# Patient Record
Sex: Female | Born: 1945 | Hispanic: No | Smoking: Former smoker
Health system: Southern US, Community
[De-identification: ages and names within clinical notes are randomized; demographics above are authoritative.]

## PROBLEM LIST (undated history)

## (undated) DIAGNOSIS — I10 Essential (primary) hypertension: Secondary | ICD-10-CM

## (undated) DIAGNOSIS — M1990 Unspecified osteoarthritis, unspecified site: Secondary | ICD-10-CM

## (undated) DIAGNOSIS — Z9981 Dependence on supplemental oxygen: Secondary | ICD-10-CM

## (undated) DIAGNOSIS — F32A Depression, unspecified: Secondary | ICD-10-CM

## (undated) DIAGNOSIS — J9611 Chronic respiratory failure with hypoxia: Secondary | ICD-10-CM

## (undated) DIAGNOSIS — I5030 Unspecified diastolic (congestive) heart failure: Secondary | ICD-10-CM

## (undated) DIAGNOSIS — I2699 Other pulmonary embolism without acute cor pulmonale: Secondary | ICD-10-CM

## (undated) DIAGNOSIS — N183 Chronic kidney disease, stage 3 unspecified: Secondary | ICD-10-CM

## (undated) DIAGNOSIS — F329 Major depressive disorder, single episode, unspecified: Secondary | ICD-10-CM

## (undated) DIAGNOSIS — E785 Hyperlipidemia, unspecified: Secondary | ICD-10-CM

## (undated) DIAGNOSIS — E119 Type 2 diabetes mellitus without complications: Secondary | ICD-10-CM

## (undated) HISTORY — DX: Essential (primary) hypertension: I10

## (undated) HISTORY — DX: Unspecified osteoarthritis, unspecified site: M19.90

## (undated) HISTORY — DX: Other pulmonary embolism without acute cor pulmonale: I26.99

## (undated) HISTORY — PX: REPLACEMENT TOTAL KNEE BILATERAL: SUR1225

## (undated) HISTORY — DX: Major depressive disorder, single episode, unspecified: F32.9

## (undated) HISTORY — PX: BACK SURGERY: SHX140

## (undated) HISTORY — DX: Depression, unspecified: F32.A

## (undated) HISTORY — PX: ABDOMINAL HYSTERECTOMY: SHX81

---

## 2017-01-27 LAB — CBC AND DIFFERENTIAL
HEMATOCRIT: 34 — AB (ref 36–46)
Hemoglobin: 10.2 — AB (ref 12.0–16.0)
Platelets: 202 (ref 150–399)
WBC: 9

## 2017-01-27 LAB — HEPATIC FUNCTION PANEL
ALT: 13 (ref 7–35)
AST: 21 (ref 13–35)
Alkaline Phosphatase: 66 (ref 25–125)
BILIRUBIN, TOTAL: 0.5

## 2017-01-31 LAB — BASIC METABOLIC PANEL
BUN: 23 — AB (ref 4–21)
CREATININE: 1.2 — AB (ref 0.5–1.1)
Potassium: 4.4 (ref 3.4–5.3)
Sodium: 138 (ref 137–147)

## 2017-02-03 ENCOUNTER — Non-Acute Institutional Stay (SKILLED_NURSING_FACILITY): Payer: Medicare Other | Admitting: Internal Medicine

## 2017-02-03 ENCOUNTER — Encounter: Payer: Self-pay | Admitting: Internal Medicine

## 2017-02-03 DIAGNOSIS — F329 Major depressive disorder, single episode, unspecified: Secondary | ICD-10-CM | POA: Diagnosis not present

## 2017-02-03 DIAGNOSIS — G629 Polyneuropathy, unspecified: Secondary | ICD-10-CM

## 2017-02-03 DIAGNOSIS — N189 Chronic kidney disease, unspecified: Secondary | ICD-10-CM | POA: Diagnosis not present

## 2017-02-03 DIAGNOSIS — I10 Essential (primary) hypertension: Secondary | ICD-10-CM

## 2017-02-03 DIAGNOSIS — F32A Depression, unspecified: Secondary | ICD-10-CM

## 2017-02-03 DIAGNOSIS — N179 Acute kidney failure, unspecified: Secondary | ICD-10-CM | POA: Diagnosis not present

## 2017-02-03 DIAGNOSIS — I2699 Other pulmonary embolism without acute cor pulmonale: Secondary | ICD-10-CM

## 2017-02-03 NOTE — Progress Notes (Signed)
: Provider:  Randon Goldsmith. [name], MD Location:  [facility] Room Number: [phone] Place of Service:  SNF (31)  PCP: No primary care provider on file. No care team member to display  Extended Emergency Contact Information Primary Emergency Contact: Laney Potash Address: [address]          [place], [place] [address] [place] of [place] Home Phone: [phone] Relation: Son Preferred language: English Interpreter needed? No     Allergies: Patient has no known allergies.  Chief Complaint  Patient presents with  . New Admit To SNF    Admit to Facility    HPI: Patient is 72 y.o. female with hypertension who presented to [facility] with shortness of breath over the past few weeks with exertion such as walking to the bathroom. She denied fever, chills, cough, or chest pain. History of COPD, asthma, or congestive heart failure. She plan to go to her PCP on the day of admission the office was closed with a holiday. In the ED the patient was afebrile, normotensive, saturating 96% on room air, with mild tachycardia, and tachypnea. Labs were normal except for sodium 133, BUN 30, creatinine 1.46. Dissection was unimpressive. EKG shows sinus tach at 102 with right bundle branch block. Patient was unable to ambulate with assistance. Patient was started on Lovenox for possible PE and his d-dimer more elevated. Patient was admitted to [facility] from 1/21-25. Secondary to acute kidney injury on chronic kidney disease stage III patient had VQ scan done which showed a low probability of PE.Marland Kitchen Her TTE also show mild to moderately dilated and hypokinetic right ventricle. Her HAI was treated with IV fluids and asked her creatinine improved a CT PE study was done that was diagnostic for PE. Patient was switched from Lovenox to Eliquis. Patient's blood pressure was low normal so her lisinopril was held. Patient is admitted to skilled nursing  facility for OT/PT. While at skilled nursing facility patient will be followed for depression treated with Celexa, polyneuropathy treated with Neurontin and hypertension treated with watchful waiting  Past Medical History:  Diagnosis Date  . Arthritis   . Depression   . Hypertension     Past Surgical History:  Procedure Laterality Date  . ABDOMINAL HYSTERECTOMY    . BACK SURGERY    . REPLACEMENT TOTAL KNEE BILATERAL      Allergies as of 02/03/2017   No Known Allergies     Medication List        Accurate as of 02/03/17  9:52 AM. Always use your most recent med list.          apixaban 5 MG Tabs tablet Commonly known as:  ELIQUIS Take 2 tablets (10 mg) by mouth 2 times daily for 5 days. Stop 02/05/2017. Start on 02/06/2017  taking 1 tablet (5 mg) by mouth 2 times daily   aspirin EC 81 MG tablet Take 81 mg by mouth daily.   citalopram 20 MG tablet Commonly known as:  CELEXA Take 20 mg by mouth daily.   gabapentin 300 MG capsule Commonly known as:  NEURONTIN Take 300 mg by mouth 3 (three) times daily.       No orders of the defined types were placed in this encounter.    There is no immunization history on file for this patient.  Social History   Tobacco Use  . Smoking status: Former Smoker    Years: 55.00  Last attempt to quit: 04/2016    Years since quitting: 0.8  . Smokeless tobacco: Never Used  Substance Use Topics  . Alcohol use: No    Frequency: Never    Family history is   Family History  Problem Relation Age of Onset  . Cancer Mother   . Hypertension Mother   . Diabetes Mother   . Hypertension Father   . Cancer Sister   . Diabetes Sister   . Diabetes Maternal Grandmother   . Hypertension Maternal Grandmother   . Diabetes Maternal Grandfather   . Hypertension Maternal Grandfather       Review of Systems  DATA OBTAINED: from patient, nurse GENERAL:  no fevers, fatigue, appetite changes SKIN: No itching, or rash EYES: No eye pain,  redness, discharge EARS: No earache, tinnitus, change in hearing NOSE: No congestion, drainage or bleeding  MOUTH/THROAT: No mouth or tooth pain, No sore throat RESPIRATORY: No cough, wheezing, SOB CARDIAC: No chest pain, palpitations, lower extremity edema  GI: No abdominal pain, No N/V/D or constipation, No heartburn or reflux  GU: No dysuria, frequency or urgency, or incontinence  MUSCULOSKELETAL: No unrelieved bone/joint pain NEUROLOGIC: No headache, dizziness or focal weakness PSYCHIATRIC: No c/o anxiety or sadness   Vitals:   02/03/17 0921  BP: 122/67  Pulse: (!) 101  Resp: 16  Temp: 98.7 F (37.1 C)  SpO2: 98%    SpO2 Readings from Last 1 Encounters:  02/03/17 98%   Body mass index is 34.28 kg/m.     Physical Exam  GENERAL APPEARANCE: Alert, conversant,  No acute distress.  SKIN: No diaphoresis rash HEAD: Normocephalic, atraumatic  EYES: Conjunctiva/lids clear. Pupils round, reactive. EOMs intact.  EARS: External exam WNL, canals clear. Hearing grossly normal.  NOSE: No deformity or discharge.  MOUTH/THROAT: Lips w/o lesions  RESPIRATORY: Breathing is even, unlabored. Lung sounds are clear   CARDIOVASCULAR: Heart RRR no murmurs, rubs or gallops. Trace peripheral edema.   GASTROINTESTINAL: Abdomen is soft, non-tender, not distended w/ normal bowel sounds. GENITOURINARY: Bladder non tender, not distended  MUSCULOSKELETAL: No abnormal joints or musculature NEUROLOGIC:  Cranial nerves 2-12 grossly intact. Moves all extremities  PSYCHIATRIC: Mood and affect appropriate to situation, no behavioral issues  There are no active problems to display for this patient.     Labs reviewed: Basic Metabolic Panel:    Component Value Date/Time   NA 138 01/31/2017   K 4.4 01/31/2017   BUN 23 (A) 01/31/2017   CREATININE 1.2 (A) 01/31/2017   AST 21 01/27/2017   ALT 13 01/27/2017   ALKPHOS 66 01/27/2017    Recent Labs    01/31/17  NA 138  K 4.4  BUN 23*    CREATININE 1.2*   Liver Function Tests: Recent Labs    01/27/17  AST 21  ALT 13  ALKPHOS 66   No results for input(s): LIPASE, AMYLASE in the last 8760 hours. No results for input(s): AMMONIA in the last 8760 hours. CBC: Recent Labs    01/27/17  WBC 9.0  HGB 10.2*  HCT 34*  PLT 202   Lipid No results for input(s): CHOL, HDL, LDLCALC, TRIG in the last 8760 hours.  Cardiac Enzymes: No results for input(s): CKTOTAL, CKMB, CKMBINDEX, TROPONINI in the last 8760 hours. BNP: No results for input(s): BNP in the last 8760 hours. No results found for: MICROALBUR No results found for: HGBA1C No results found for: TSH No results found for: VITAMINB12 No results found for: FOLATE No results found  for: IRON, TIBC, FERRITIN  Imaging and Procedures obtained prior to SNF admission: Patient was never admitted.   Not all labs, radiology exams or other studies done during hospitalization come through on my EPIC note; however they are reviewed by me.    Assessment and Plan  Pulmonary embolism- patient with low probability VQ scan but all other indicators were pointing towards a PE; once her acute kidney injury was improved with IV fluids CT PE was diagnostic for PE; patient had artery and placed on Lovenox but then was switched to Eliquis. SNF - admitted for OT/PT; continue Eliquis 2 mg twice a day until 1/30 then 5 mg twice a day starting 1/31 with a stop date of 05/07/2017  Hypertension-blood pressure was soft so both blood pressure medication were stopped including patient's lisinopril SNF - will be monitoring at skilled nursing facility; initial blood pressure meds as indicated  Acute renal failure on chronic kidney disease stage III-treated with IV fluids; initial creatinine 1.46 SNF - -on 1/25 patient's creatinine was 1.24; will follow-up BMP  Polyneuropathy SNF - not stated as uncontrolled; continue Neurontin 300 mg by mouth 3 times a day  Depression SNF - not stated as  uncontrolled; continue Celexa 20 mg by mouth daily   Time spent greater than 45 minutes;> 50% of time with patient was spent reviewing records, labs, tests and studies, counseling and developing plan of care  Thurston Holenne D. Lyn HollingsheadAlexander, MD

## 2017-02-05 LAB — BASIC METABOLIC PANEL
BUN: 19 (ref 4–21)
CREATININE: 1 (ref <=1.1)
GLUCOSE: 95
POTASSIUM: 4.3 (ref 3.4–5.3)
SODIUM: 142 (ref 137–147)

## 2017-02-05 LAB — CBC AND DIFFERENTIAL
HCT: 24 — AB (ref 36–46)
Hemoglobin: 6.7 — AB (ref 12.0–16.0)
Platelets: 293 (ref 150–399)
WBC: 9.7

## 2017-02-05 LAB — POCT INR: INR: 1.5 — AB (ref 0.9–1.1)

## 2017-02-07 ENCOUNTER — Other Ambulatory Visit: Payer: Self-pay | Admitting: *Deleted

## 2017-02-08 ENCOUNTER — Encounter: Payer: Self-pay | Admitting: Internal Medicine

## 2017-02-08 DIAGNOSIS — N189 Chronic kidney disease, unspecified: Secondary | ICD-10-CM

## 2017-02-08 DIAGNOSIS — F329 Major depressive disorder, single episode, unspecified: Secondary | ICD-10-CM | POA: Insufficient documentation

## 2017-02-08 DIAGNOSIS — I2699 Other pulmonary embolism without acute cor pulmonale: Secondary | ICD-10-CM | POA: Insufficient documentation

## 2017-02-08 DIAGNOSIS — G629 Polyneuropathy, unspecified: Secondary | ICD-10-CM | POA: Insufficient documentation

## 2017-02-08 DIAGNOSIS — N179 Acute kidney failure, unspecified: Secondary | ICD-10-CM | POA: Insufficient documentation

## 2017-02-08 DIAGNOSIS — I10 Essential (primary) hypertension: Secondary | ICD-10-CM | POA: Insufficient documentation

## 2017-02-08 DIAGNOSIS — F32A Depression, unspecified: Secondary | ICD-10-CM | POA: Insufficient documentation

## 2017-02-11 LAB — BASIC METABOLIC PANEL
BUN: 14 (ref 4–21)
Creatinine: 1 (ref 0.5–1.1)
Potassium: 3.8 (ref 3.4–5.3)
SODIUM: 142 (ref 137–147)

## 2017-02-12 LAB — CBC AND DIFFERENTIAL
HCT: 26 — AB (ref 36–46)
HEMOGLOBIN: 8 — AB (ref 12.0–16.0)
Platelets: 226 (ref 150–399)
WBC: 10.9

## 2017-02-14 ENCOUNTER — Non-Acute Institutional Stay (SKILLED_NURSING_FACILITY): Payer: Medicare Other | Admitting: Internal Medicine

## 2017-02-14 ENCOUNTER — Encounter: Payer: Self-pay | Admitting: Internal Medicine

## 2017-02-14 DIAGNOSIS — I2699 Other pulmonary embolism without acute cor pulmonale: Secondary | ICD-10-CM

## 2017-02-14 DIAGNOSIS — K922 Gastrointestinal hemorrhage, unspecified: Secondary | ICD-10-CM

## 2017-02-14 DIAGNOSIS — R600 Localized edema: Secondary | ICD-10-CM

## 2017-02-14 DIAGNOSIS — N390 Urinary tract infection, site not specified: Secondary | ICD-10-CM | POA: Diagnosis not present

## 2017-02-14 DIAGNOSIS — D62 Acute posthemorrhagic anemia: Secondary | ICD-10-CM | POA: Diagnosis not present

## 2017-02-14 DIAGNOSIS — B9620 Unspecified Escherichia coli [E. coli] as the cause of diseases classified elsewhere: Secondary | ICD-10-CM

## 2017-02-14 DIAGNOSIS — R0902 Hypoxemia: Secondary | ICD-10-CM | POA: Diagnosis not present

## 2017-02-14 DIAGNOSIS — F32A Depression, unspecified: Secondary | ICD-10-CM

## 2017-02-14 DIAGNOSIS — F329 Major depressive disorder, single episode, unspecified: Secondary | ICD-10-CM

## 2017-02-14 DIAGNOSIS — I10 Essential (primary) hypertension: Secondary | ICD-10-CM

## 2017-02-14 DIAGNOSIS — G629 Polyneuropathy, unspecified: Secondary | ICD-10-CM | POA: Diagnosis not present

## 2017-02-14 NOTE — Progress Notes (Signed)
Provider:  [name] MD Location:  [facility] Room Number: [id number] Place of Service:  SNF (31)  PCP: No primary care provider on file. No care team member to display  Extended Emergency Contact Information Primary Emergency Contact: Laney Potash Address: [address]          [place], [place] [address] D[place] of [place] Home Phone: [phone] Relation: Son Preferred language: English Interpreter needed? No Secondary Emergency Contact: Marcille Buffy Home Phone: [phone] Relation: Sister     Allergies: Patient has no known allergies.  Chief Complaint  Patient presents with  . Readmit To SNF    HPI: Patient is an 72 y.o. female with hypertension, history of pulmonary embolism diagnosed 2 weeks ago on Eliquis and aspirin who was admitted to [facility] from 1/30-2/6 after her five-day labs at the nursing home showed a hemoglobin of 6.7. There was no history of GI bleed or any other symptoms such as shortness of breath chest pain and weakness. Patient's Hemoccult was positive in the ED. Patient underwent EGD and colonoscopy were no source of bleeding therefore source of bleeding was felt to be the small bowel. Patient received 2 units PRBC with a DC hemoglobin of 8. Hospital course was complicated by an Escherichia coli UTI treated with Rocephin for 3 days and then oral medications for 12 more days. Patient also had hypoxia requiring 1-2 L of O2 which she was not able to be weaned off of in the hospital. patient is admitted to skilled nursing facility for continued OT/PT. While at skilled nursing facility patient will be treated for depression with Celexa, polyneuropathy treated with Neurontin and PE treated with Eliquis.  Past Medical History:  Diagnosis Date  . Arthritis   . Depression   . Hypertension   . Pulmonary embolism [facility])     Past Surgical History:  Procedure Laterality Date  . ABDOMINAL HYSTERECTOMY     . BACK SURGERY    . REPLACEMENT TOTAL KNEE BILATERAL      Allergies as of 02/14/2017   No Known Allergies     Medication List        Accurate as of 02/14/17  2:07 PM. Always use your most recent med list.          acetaminophen 325 MG tablet Commonly known as:  TYLENOL Take 650 mg by mouth every 4 (four) hours as needed for mild pain or fever.   albuterol (2.5 MG/3ML) 0.083% nebulizer solution Commonly known as:  PROVENTIL Take 2.5 mg by nebulization every 4 (four) hours as needed for wheezing.   apixaban 5 MG Tabs tablet Commonly known as:  ELIQUIS Take 2 tablets (10 mg) by mouth 2 times daily for 5 days. Stop 02/05/2017. Start on 02/06/2017  taking 1 tablet (5 mg) by mouth 2 times daily   ciprofloxacin 500 MG tablet Commonly known as:  CIPRO Take 500 mg by mouth 2 (two) times daily.   citalopram 20 MG tablet Commonly known as:  CELEXA Take 20 mg by mouth daily.   gabapentin 300 MG capsule Commonly known as:  NEURONTIN Take 300 mg by mouth 3 (three) times daily.   pantoprazole 40 MG tablet Commonly known as:  PROTONIX Take 40 mg by mouth daily.        There is no immunization history on file for this patient.  Social History   Tobacco Use  . Smoking status: Former Smoker    Years: 55.00  Last attempt to quit: 04/2016    Years since quitting: 0.8  . Smokeless tobacco: Never Used  Substance Use Topics  . Alcohol use: No    Frequency: Never    Family history is   Family History  Problem Relation Age of Onset  . Cancer Mother   . Hypertension Mother   . Diabetes Mother   . Hypertension Father   . Cancer Sister   . Diabetes Sister   . Diabetes Maternal Grandmother   . Hypertension Maternal Grandmother   . Diabetes Maternal Grandfather   . Hypertension Maternal Grandfather       Review of Systems  DATA OBTAINED: from patient, nurse GENERAL:  no fevers, fatigue, appetite changes SKIN: No itching, or rash EYES: No eye pain, redness,  discharge EARS: No earache, tinnitus, change in hearing NOSE: No congestion, drainage or bleeding  MOUTH/THROAT: No mouth or tooth pain, No sore throat RESPIRATORY: No cough, wheezing, SOB CARDIAC: No chest pain, palpitations, lower extremity edema  GI: No abdominal pain, No N/V/D or constipation, No heartburn or reflux  GU: No dysuria, frequency or urgency, or incontinence  MUSCULOSKELETAL: No unrelieved bone/joint pain NEUROLOGIC: No headache, dizziness or focal weakness PSYCHIATRIC: No c/o anxiety or sadness   Vitals:   02/14/17 1341  BP: 140/77  Pulse: 85  Resp: 18  Temp: (!) 97.2 F (36.2 C)  SpO2: 98%    SpO2 Readings from Last 1 Encounters:  02/14/17 98%   Body mass index is 37.94 kg/m.     Physical Exam  GENERAL APPEARANCE: Alert, conversant,  No acute distress.  SKIN: No diaphoresis rash HEAD: Normocephalic, atraumatic  EYES: Conjunctiva/lids clear. Pupils round, reactive. EOMs intact.  EARS: External exam WNL, canals clear. Hearing grossly normal.  NOSE: No deformity or discharge.  MOUTH/THROAT: Lips w/o lesions  RESPIRATORY: Breathing is even, unlabored. Lung sounds are clear   CARDIOVASCULAR: Heart RRR no murmurs, rubs or gallops. No peripheral edema.   GASTROINTESTINAL: Abdomen is soft, non-tender, not distended w/ normal bowel sounds. GENITOURINARY: Bladder non tender, not distended  MUSCULOSKELETAL: No abnormal joints or musculature NEUROLOGIC:  Cranial nerves 2-12 grossly intact. Moves all extremities  PSYCHIATRIC: Mood and affect appropriate to situation, no behavioral issues  Patient Active Problem List   Diagnosis Date Noted  . Pulmonary embolism (HCC) 02/08/2017  . Hypertension 02/08/2017  . Acute kidney injury superimposed on chronic kidney disease (HCC) 02/08/2017  . Polyneuropathy 02/08/2017  . Depression 02/08/2017      Labs reviewed: Basic Metabolic Panel:    Component Value Date/Time   NA 142 02/11/2017   K 3.8 02/11/2017   BUN  14 02/11/2017   CREATININE 1.0 02/11/2017   AST 21 01/27/2017   ALT 13 01/27/2017   ALKPHOS 66 01/27/2017    Recent Labs    01/31/17 02/05/17 02/11/17  NA 138 142 142  K 4.4 4.3 3.8  BUN 23* 19 14  CREATININE 1.2* 1.0 1.0   Liver Function Tests: Recent Labs    01/27/17  AST 21  ALT 13  ALKPHOS 66   CBC: Recent Labs    01/27/17 02/05/17 02/12/17  WBC 9.0 9.7 10.9  HGB 10.2* 6.7* 8.0*  HCT 34* 24* 26*  PLT 202 293 226    Imaging and Procedures obtained prior to SNF admission: Patient was never admitted.   Not all labs, radiology exams or other studies done during hospitalization come through on my EPIC note; however they are reviewed by me.    Assessment and  Plan  Acute GI bleed/acute blood loss anemia-cause unknown but felt to be small bowel bleed since patient underwent EGD and colonoscopy without any signs of bleeding; bleed felt to be secondary to Eliquis and aspirin; Place of presentation hemoglobin was 7.1, she received 2 units PRBC and her discharge hemoglobin is 8 SNF [facility]; will follow-up CBC regularly  Pulmonary embolism-had been treated with Eliquis and aspirin, now just Eliquis SNF - continue Eliquis 5 mg twice a day with a start date of 1/31 and a stop date of 5/1  Escherichia coli UTI-patient was treated with Rocephin for 3 days SNF - Cipro 500 mg twice a day for 12 more days to finish course  Hypoxia/history of PE-probably secondary to PE; patient on 1-2 L nasal cannula SNF - continuing to until patient can be weaned from it; continue Eliquis 5 mg by mouth twice a day  Hypertension SNF - on no meds currently; we'll monitor  Depression SNF - stable continue Celexa 20 mg daily  Polyneuropathy SNF - patient without complaints continue Neurontin 300 mg 3 times a day  Upper extremity edema/ lower extremity edema SNF - noted on admission; will start patient on Lasix 20 mg daily with follow-up BMP   Time spent greater  than 35 minutes;> 50% of time with patient was spent reviewing records, labs, tests and studies, counseling and developing plan of care  Margit HanksAnne D Stephnie Parlier, MD

## 2017-02-15 ENCOUNTER — Encounter: Payer: Self-pay | Admitting: Internal Medicine

## 2017-02-15 DIAGNOSIS — R0902 Hypoxemia: Secondary | ICD-10-CM | POA: Insufficient documentation

## 2017-02-15 DIAGNOSIS — R600 Localized edema: Secondary | ICD-10-CM | POA: Insufficient documentation

## 2017-02-15 DIAGNOSIS — J9611 Chronic respiratory failure with hypoxia: Secondary | ICD-10-CM | POA: Insufficient documentation

## 2017-02-15 DIAGNOSIS — B9620 Unspecified Escherichia coli [E. coli] as the cause of diseases classified elsewhere: Secondary | ICD-10-CM | POA: Insufficient documentation

## 2017-02-15 DIAGNOSIS — D62 Acute posthemorrhagic anemia: Secondary | ICD-10-CM | POA: Insufficient documentation

## 2017-02-15 DIAGNOSIS — K922 Gastrointestinal hemorrhage, unspecified: Secondary | ICD-10-CM | POA: Insufficient documentation

## 2017-02-15 DIAGNOSIS — J9610 Chronic respiratory failure, unspecified whether with hypoxia or hypercapnia: Secondary | ICD-10-CM | POA: Insufficient documentation

## 2017-02-15 DIAGNOSIS — N390 Urinary tract infection, site not specified: Secondary | ICD-10-CM

## 2017-03-03 LAB — CBC AND DIFFERENTIAL
HCT: 25 — AB (ref 36–46)
Hemoglobin: 7.6 — AB (ref 12.0–16.0)

## 2017-03-13 ENCOUNTER — Non-Acute Institutional Stay (SKILLED_NURSING_FACILITY): Payer: Medicare Other | Admitting: Internal Medicine

## 2017-03-13 DIAGNOSIS — F329 Major depressive disorder, single episode, unspecified: Secondary | ICD-10-CM | POA: Diagnosis not present

## 2017-03-13 DIAGNOSIS — F32A Depression, unspecified: Secondary | ICD-10-CM

## 2017-03-13 DIAGNOSIS — D62 Acute posthemorrhagic anemia: Secondary | ICD-10-CM | POA: Diagnosis not present

## 2017-03-13 DIAGNOSIS — I2699 Other pulmonary embolism without acute cor pulmonale: Secondary | ICD-10-CM

## 2017-03-16 ENCOUNTER — Encounter: Payer: Self-pay | Admitting: Internal Medicine

## 2017-03-16 NOTE — Assessment & Plan Note (Signed)
Appears stable; continue Celexa 20 mg by mouth daily

## 2017-03-16 NOTE — Assessment & Plan Note (Signed)
Most recent hemoglobin 7.6; have ordered iron panel; will continue to monitor hemoglobin; may still be bleeding from what was felt to be a small bowel bleed

## 2017-03-16 NOTE — Assessment & Plan Note (Deleted)
Resolved

## 2017-03-16 NOTE — Progress Notes (Addendum)
[facility]   Place of Service:  SNF (31)  No primary care provider on file.  No care team member to display  Extended Emergency Contact Information Primary Emergency Contact: Laney PotashHarris, Clarence Address: [address]          [place], [place] [address] [place] Home Phone: [phone] Relation: Son Preferred language: English Interpreter needed? No Secondary Emergency Contact: Marcille Buffyroberts, lillian Home Phone: [phone] Relation: Sister    Allergies: Patient has no known allergies.  Chief Complaint  Patient presents with  . Medical Management of Chronic Issues    HPI: Patient is 72 y.o. female who is being seen for routine issues of depression, acute blood loss anemia, and  PE.  Past Medical History:  Diagnosis Date  . Arthritis   . Depression   . Hypertension   . Pulmonary embolism [facility](HCC)     Past Surgical History:  Procedure Laterality Date  . ABDOMINAL HYSTERECTOMY    . BACK SURGERY    . REPLACEMENT TOTAL KNEE BILATERAL      Allergies as of 03/13/2017   No Known Allergies     Medication List        Accurate as of 03/13/17 11:59 PM. Always use your most recent med list.          acetaminophen 325 MG tablet Commonly known as:  TYLENOL Take 650 mg by mouth every 4 (four) hours as needed for mild pain or fever.   albuterol (2.5 MG/3ML) 0.083% nebulizer solution Commonly known as:  PROVENTIL Take 2.5 mg by nebulization every 4 (four) hours as needed for wheezing.   apixaban 5 MG Tabs tablet Commonly known as:  ELIQUIS Take 2 tablets (10 mg) by mouth 2 times daily for 5 days. Stop 02/05/2017. Start on 02/06/2017  taking 1 tablet (5 mg) by mouth 2 times daily   citalopram 20 MG tablet Commonly known as:  CELEXA Take 20 mg by mouth daily.   gabapentin 300 MG capsule Commonly known as:  NEURONTIN Take 300 mg by mouth 3 (three) times daily.   pantoprazole 40 MG tablet Commonly known as:  PROTONIX Take 40 mg by  mouth daily.       No orders of the defined types were placed in this encounter.    There is no immunization history on file for this patient.  Social History   Tobacco Use  . Smoking status: Former Smoker    Years: 55.00    Last attempt to quit: 04/2016    Years since quitting: 0.9  . Smokeless tobacco: Never Used  Substance Use Topics  . Alcohol use: No    Frequency: Never    Review of Systems  DATA OBTAINED: from patient, nurse GENERAL:  no fevers, fatigue, appetite changes SKIN: No itching, rash HEENT: No complaint RESPIRATORY: No cough, wheezing, SOB CARDIAC: No chest pain, palpitations, lower extremity edema  GI: No abdominal pain, No N/V/D or constipation, No heartburn or reflux  GU: No dysuria, frequency or urgency, or incontinence  MUSCULOSKELETAL: No unrelieved bone/joint pain NEUROLOGIC: No headache, dizziness  PSYCHIATRIC: No overt anxiety or sadness  Vitals:   03/16/17 1446  BP: 135/68  Pulse: 74  Resp: 18  Temp: (!) 97 F (36.1 C)   There is no height or weight on file to calculate BMI. Physical Exam  GENERAL APPEARANCE: Alert, conversant, No acute distress  SKIN: No diaphoresis rash HEENT: Unremarkable RESPIRATORY: Breathing is even, unlabored. Lung sounds are clear ;  wearing O2  CARDIOVASCULAR: Heart RRR no murmurs, rubs or gallops. No peripheral edema  GASTROINTESTINAL: Abdomen is soft, non-tender, not distended w/ normal bowel sounds.  GENITOURINARY: Bladder non tender, not distended  MUSCULOSKELETAL: No abnormal joints or musculature NEUROLOGIC: Cranial nerves 2-12 grossly intact. Moves all extremities PSYCHIATRIC: Mood and affect appropriate to situation, no behavioral issues  Patient Active Problem List   Diagnosis Date Noted  . GI bleed 02/15/2017  . Acute blood loss anemia 02/15/2017  . Escherichia coli urinary tract infection 02/15/2017  . Hypoxia 02/15/2017  . Edema of upper extremity 02/15/2017  . Bilateral lower extremity  edema 02/15/2017  . Pulmonary embolism (HCC) 02/08/2017  . Hypertension 02/08/2017  . Acute kidney injury superimposed on chronic kidney disease (HCC) 02/08/2017  . Polyneuropathy 02/08/2017  . Depression 02/08/2017    CMP     Component Value Date/Time   NA 142 02/11/2017   K 3.8 02/11/2017   BUN 14 02/11/2017   CREATININE 1.0 02/11/2017   AST 21 01/27/2017   ALT 13 01/27/2017   ALKPHOS 66 01/27/2017   Recent Labs    01/31/17 02/05/17 02/11/17  NA 138 142 142  K 4.4 4.3 3.8  BUN 23* 19 14  CREATININE 1.2* 1.0 1.0   Recent Labs    01/27/17  AST 21  ALT 13  ALKPHOS 66   Recent Labs    01/27/17 02/05/17 02/12/17 03/03/17  WBC 9.0 9.7 10.9  --   HGB 10.2* 6.7* 8.0* 7.6*  HCT 34* 24* 26* 25*  PLT 202 293 226  --    No results for input(s): CHOL, LDLCALC, TRIG in the last 8760 hours.  Invalid input(s): HCL No results found for: MICROALBUR No results found for: TSH No results found for: HGBA1C No results found for: CHOL, HDL, LDLCALC, LDLDIRECT, TRIG, CHOLHDL  Significant Diagnostic Results in last 30 days:  No results found.  Assessment and Plan  Depression Appears stable; continue Celexa 20 mg by mouth daily  Acute blood loss anemia Most recent hemoglobin 7.6; have ordered iron panel; will continue to monitor hemoglobin; may still be bleeding from what was felt to be a small bowel bleed  Pulmonary embolism (HCC) On Eliquis until May 1; patient still requiring O2, will try to wean     Merrilee Seashore, MD

## 2017-03-16 NOTE — Assessment & Plan Note (Signed)
On Eliquis until May 1; patient still requiring O2, will try to wean

## 2017-03-17 LAB — CBC AND DIFFERENTIAL
HCT: 32 — AB (ref 36–46)
Hemoglobin: 9.1 — AB (ref 12.0–16.0)

## 2017-04-01 LAB — CBC AND DIFFERENTIAL
HEMATOCRIT: 29 — AB (ref 36–46)
HEMOGLOBIN: 8.4 — AB (ref 12.0–16.0)
Platelets: 226 (ref 150–399)
WBC: 6.7

## 2017-04-14 LAB — CBC AND DIFFERENTIAL
HEMATOCRIT: 29 — AB (ref 36–46)
Hemoglobin: 9 — AB (ref 12.0–16.0)
NEUTROS ABS: 4
PLATELETS: 237 (ref 150–399)
WBC: 6.1

## 2017-04-29 ENCOUNTER — Non-Acute Institutional Stay (SKILLED_NURSING_FACILITY): Payer: Medicare Other | Admitting: Internal Medicine

## 2017-04-29 ENCOUNTER — Encounter: Payer: Self-pay | Admitting: Internal Medicine

## 2017-04-29 DIAGNOSIS — R600 Localized edema: Secondary | ICD-10-CM | POA: Diagnosis not present

## 2017-04-29 DIAGNOSIS — G629 Polyneuropathy, unspecified: Secondary | ICD-10-CM

## 2017-04-29 DIAGNOSIS — I10 Essential (primary) hypertension: Secondary | ICD-10-CM | POA: Diagnosis not present

## 2017-04-29 NOTE — Progress Notes (Signed)
[facility] Room Number: [phone] Place of Service:  SNF [phone]  [name] D. Lyn HollingsheadAlexander, MD  No care team member to display  Extended Emergency Contact Information Primary Emergency Contact: Laney PotashHarris, Clarence Address: [address]          [place], [place] [address] [place] Home Phone: [phone] Relation: Son Preferred language: English Interpreter needed? No Secondary Emergency Contact: Marcille Buffyroberts, lillian Home Phone: [phone] Relation: Sister    Allergies: Patient has no known allergies.  Chief Complaint  Patient presents with  . Medical Management of Chronic Issues    Routine Visit    HPI: Patient is 72 y.o. female who is being seen for routine issues of polyneuropathy, hypertension, and bilateral lower extremity edema.  Past Medical History:  Diagnosis Date  . Arthritis   . Depression   . Hypertension   . Pulmonary embolism [facility](HCC)     Past Surgical History:  Procedure Laterality Date  . ABDOMINAL HYSTERECTOMY    . BACK SURGERY    . REPLACEMENT TOTAL KNEE BILATERAL      Allergies as of 04/29/2017   No Known Allergies     Medication List        Accurate as of 04/29/17 11:59 PM. Always use your most recent med list.          acetaminophen 325 MG tablet Commonly known as:  TYLENOL Take 650 mg by mouth every 4 (four) hours as needed for mild pain or fever.   acetaminophen 500 MG tablet Commonly known as:  TYLENOL Take 500 mg by mouth 2 (two) times daily.   albuterol (2.5 MG/3ML) 0.083% nebulizer solution Commonly known as:  PROVENTIL Take 2.5 mg by nebulization every 4 (four) hours as needed for wheezing.   apixaban 5 MG Tabs tablet Commonly known as:  ELIQUIS Take 5 mg by mouth 2 (two) times daily.   citalopram 20 MG tablet Commonly known as:  CELEXA Take 20 mg by mouth daily.   furosemide 20 MG tablet Commonly known as:  LASIX Take 20 mg by mouth daily.   gabapentin 300 MG  capsule Commonly known as:  NEURONTIN Take 300 mg by mouth 3 (three) times daily.   pantoprazole 40 MG tablet Commonly known as:  PROTONIX Take 40 mg by mouth daily.   potassium chloride 10 MEQ tablet Commonly known as:  K-DUR,KLOR-CON Take 10 mEq by mouth daily.   Vitamin D (Ergocalciferol) 50000 units Caps capsule Commonly known as:  DRISDOL Take 50,000 Units by mouth every 7 (seven) days.       No orders of the defined types were placed in this encounter.    There is no immunization history on file for this patient.  Social History   Tobacco Use  . Smoking status: Former Smoker    Years: 55.00    Last attempt to quit: 04/2016    Years since quitting: 1.1  . Smokeless tobacco: Never Used  Substance Use Topics  . Alcohol use: No    Frequency: Never    Review of Systems  DATA OBTAINED: from patient GENERAL:  no fevers, fatigue, appetite changes SKIN: No itching, rash HEENT: No complaint RESPIRATORY: No cough, wheezing, SOB CARDIAC: No chest pain, palpitations, lower extremity edema  GI: No abdominal pain, No N/V/D or constipation, No heartburn or reflux  GU: No dysuria, frequency or urgency, or incontinence  MUSCULOSKELETAL: No unrelieved bone/joint pain NEUROLOGIC: No headache, dizziness  PSYCHIATRIC: No overt anxiety or  sadness  Vitals:   04/29/17 1513  BP: 130/78  Pulse: 69  Resp: 16  Temp: 98.2 F (36.8 C)  SpO2: 98%   Body mass index is 37.26 kg/m. Physical Exam  GENERAL APPEARANCE: Alert, conversant, No acute distress  SKIN: No diaphoresis rash HEENT: Unremarkable RESPIRATORY: Breathing is even, unlabored. Lung sounds are clear   CARDIOVASCULAR: Heart RRR no murmurs, rubs or gallops.  This peripheral edema  GASTROINTESTINAL: Abdomen is soft, non-tender, not distended w/ normal bowel sounds.  GENITOURINARY: Bladder non tender, not distended  MUSCULOSKELETAL: No abnormal joints or musculature NEUROLOGIC: Cranial nerves 2-12 grossly intact.  Moves all extremities PSYCHIATRIC: Mood and affect appropriate to situation, no behavioral issues  Patient Active Problem List   Diagnosis Date Noted  . GI bleed 02/15/2017  . Acute blood loss anemia 02/15/2017  . Escherichia coli urinary tract infection 02/15/2017  . Hypoxia 02/15/2017  . Edema of upper extremity 02/15/2017  . Bilateral lower extremity edema 02/15/2017  . Pulmonary embolism (HCC) 02/08/2017  . Hypertension 02/08/2017  . Acute kidney injury superimposed on chronic kidney disease (HCC) 02/08/2017  . Polyneuropathy 02/08/2017  . Depression 02/08/2017    CMP     Component Value Date/Time   NA 142 02/11/2017   K 3.8 02/11/2017   BUN 14 02/11/2017   CREATININE 1.0 02/11/2017   AST 21 01/27/2017   ALT 13 01/27/2017   ALKPHOS 66 01/27/2017   Recent Labs    01/31/17 02/05/17 02/11/17  NA 138 142 142  K 4.4 4.3 3.8  BUN 23* 19 14  CREATININE 1.2* 1.0 1.0   Recent Labs    01/27/17  AST 21  ALT 13  ALKPHOS 66   Recent Labs    02/12/17  03/17/17 04/01/17 04/14/17  WBC 10.9  --   --  6.7 6.1  NEUTROABS  --   --   --   --  4  HGB 8.0*   < > 9.1* 8.4* 9.0*  HCT 26*   < > 32* 29* 29*  PLT 226  --   --  226 237   < > = values in this interval not displayed.   No results for input(s): CHOL, LDLCALC, TRIG in the last 8760 hours.  Invalid input(s): HCL No results found for: MICROALBUR No results found for: TSH No results found for: HGBA1C No results found for: CHOL, HDL, LDLCALC, LDLDIRECT, TRIG, CHOLHDL  Significant Diagnostic Results in last 30 days:  No results found.  Assessment and Plan  Polyneuropathy Patient complains that her current regimen is not working;  Hypertension Controlled on Lasix 20 mg daily; continue Lasix 20 mg daily  Bilateral lower extremity edema At baseline; continue Lasix 20 mg daily    Anne D. [name], MD

## 2017-05-18 ENCOUNTER — Encounter: Payer: Self-pay | Admitting: Internal Medicine

## 2017-05-18 NOTE — Assessment & Plan Note (Signed)
Patient complains that her current regimen is not working;

## 2017-05-18 NOTE — Assessment & Plan Note (Signed)
Controlled on Lasix 20 mg daily; continue Lasix 20 mg daily 

## 2017-05-18 NOTE — Assessment & Plan Note (Addendum)
At baseline; continue Lasix 20 mg daily

## 2017-05-20 ENCOUNTER — Encounter: Payer: Self-pay | Admitting: Internal Medicine

## 2017-05-20 NOTE — Progress Notes (Signed)
Opened in error; Disregard.

## 2017-05-21 ENCOUNTER — Non-Acute Institutional Stay (SKILLED_NURSING_FACILITY): Payer: Medicare Other | Admitting: Internal Medicine

## 2017-05-21 ENCOUNTER — Encounter: Payer: Self-pay | Admitting: Internal Medicine

## 2017-05-21 DIAGNOSIS — N189 Chronic kidney disease, unspecified: Secondary | ICD-10-CM | POA: Diagnosis not present

## 2017-05-21 DIAGNOSIS — F329 Major depressive disorder, single episode, unspecified: Secondary | ICD-10-CM

## 2017-05-21 DIAGNOSIS — F32A Depression, unspecified: Secondary | ICD-10-CM

## 2017-05-21 DIAGNOSIS — M1990 Unspecified osteoarthritis, unspecified site: Secondary | ICD-10-CM

## 2017-05-21 NOTE — Progress Notes (Signed)
[facility] Room Number: [phone] Place of Service:  SNF (31)  [name], MD  Patient Care Team: [name], MD as PCP - General (Internal Medicine)  Extended Emergency Contact Information Primary Emergency Contact: Laney Potash Address: [address]          [place], [place] [address] [place] Home Phone: [phone] Relation: Son Preferred language: English Interpreter needed? No Secondary Emergency Contact: Marcille Buffy Home Phone: [phone] Relation: Sister    Allergies: Patient has no known allergies.  Chief Complaint  Patient presents with  . Medical Management of Chronic Issues    HPI: Patient is 73 y.o. female who is being seen for routine issues of depression, chronic kidney disease, and osteoarthritis.  Past Medical History:  Diagnosis Date  . Arthritis   . Depression   . Hypertension   . Pulmonary embolism [facility])     Past Surgical History:  Procedure Laterality Date  . ABDOMINAL HYSTERECTOMY    . BACK SURGERY    . REPLACEMENT TOTAL KNEE BILATERAL      Allergies as of 05/21/2017   No Known Allergies     Medication List        Accurate as of 05/21/17 11:59 PM. Always use your most recent med list.          acetaminophen 325 MG tablet Commonly known as:  TYLENOL Take 650 mg by mouth every 4 (four) hours as needed for mild pain or fever.   acetaminophen 500 MG tablet Commonly known as:  TYLENOL Take 500 mg by mouth 2 (two) times daily.   albuterol (2.5 MG/3ML) 0.083% nebulizer solution Commonly known as:  PROVENTIL Take 2.5 mg by nebulization every 4 (four) hours as needed for wheezing.   diphenhydrAMINE 25 MG tablet Commonly known as:  BENADRYL Take 25 mg by mouth every 6 (six) hours as needed for itching.   furosemide 20 MG tablet Commonly known as:  LASIX Take 20 mg by mouth daily.   gabapentin 300 MG capsule Commonly known as:  NEURONTIN Take 600 mg  by mouth 3 (three) times daily.   pantoprazole 40 MG tablet Commonly known as:  PROTONIX Take 40 mg by mouth daily.   potassium chloride 10 MEQ tablet Commonly known as:  K-DUR,KLOR-CON Take 10 mEq by mouth daily.   Vitamin D (Ergocalciferol) 50000 units Caps capsule Commonly known as:  DRISDOL Take 50,000 Units by mouth every 7 (seven) days.       No orders of the defined types were placed in this encounter.    There is no immunization history on file for this patient.  Social History   Tobacco Use  . Smoking status: Former Smoker    Years: 55.00    Last attempt to quit: 04/2016    Years since quitting: 1.1  . Smokeless tobacco: Never Used  Substance Use Topics  . Alcohol use: No    Frequency: Never    Review of Systems  DATA OBTAINED: from patient, nurse GENERAL:  no fevers, fatigue, appetite changes SKIN: No itching, rash HEENT: No complaint RESPIRATORY: No cough, wheezing, SOB CARDIAC: No chest pain, palpitations, lower extremity edema  GI: No abdominal pain, No N/V/D or constipation, No heartburn or reflux  GU: No dysuria, frequency or urgency, or incontinence  MUSCULOSKELETAL: No unrelieved bone/joint pain NEUROLOGIC: No headache, dizziness  PSYCHIATRIC: No overt anxiety or sadness  Vitals:   05/21/17 1503  BP: (!) 132/58  Pulse: 76  Resp: 18  Temp: (!) 97.4 F (36.3 C)  SpO2: 96%   Body mass index is 37.85 kg/m. Physical Exam  GENERAL APPEARANCE: Alert, conversant, No acute distress  SKIN: No diaphoresis rash HEENT: Unremarkable RESPIRATORY: Breathing is even, unlabored. Lung sounds are clear   CARDIOVASCULAR: Heart RRR no murmurs, rubs or gallops. No peripheral edema  GASTROINTESTINAL: Abdomen is soft, non-tender, not distended w/ normal bowel sounds.  GENITOURINARY: Bladder non tender, not distended  MUSCULOSKELETAL: No abnormal joints or musculature NEUROLOGIC: Cranial nerves 2-12 grossly intact. Moves all extremities PSYCHIATRIC: Mood  and affect appropriate to situation, no behavioral issues  Patient Active Problem List   Diagnosis Date Noted  . Chronic kidney disease (CKD) 06/02/2017  . Arthritis 06/02/2017  . GI bleed 02/15/2017  . Acute blood loss anemia 02/15/2017  . Escherichia coli urinary tract infection 02/15/2017  . Hypoxia 02/15/2017  . Edema of upper extremity 02/15/2017  . Bilateral lower extremity edema 02/15/2017  . Pulmonary embolism (HCC) 02/08/2017  . Hypertension 02/08/2017  . Acute kidney injury superimposed on chronic kidney disease (HCC) 02/08/2017  . Polyneuropathy 02/08/2017  . Depression 02/08/2017    CMP     Component Value Date/Time   NA 142 02/11/2017   K 3.8 02/11/2017   BUN 14 02/11/2017   CREATININE 1.0 02/11/2017   AST 21 01/27/2017   ALT 13 01/27/2017   ALKPHOS 66 01/27/2017   Recent Labs    01/31/17 02/05/17 02/11/17  NA 138 142 142  K 4.4 4.3 3.8  BUN 23* 19 14  CREATININE 1.2* 1.0 1.0   Recent Labs    01/27/17  AST 21  ALT 13  ALKPHOS 66   Recent Labs    02/12/17  03/17/17 04/01/17 04/14/17  WBC 10.9  --   --  6.7 6.1  NEUTROABS  --   --   --   --  4  HGB 8.0*   < > 9.1* 8.4* 9.0*  HCT 26*   < > 32* 29* 29*  PLT 226  --   --  226 237   < > = values in this interval not displayed.   No results for input(s): CHOL, LDLCALC, TRIG in the last 8760 hours.  Invalid input(s): HCL No results found for: MICROALBUR No results found for: TSH No results found for: HGBA1C No results found for: CHOL, HDL, LDLCALC, LDLDIRECT, TRIG, CHOLHDL  Significant Diagnostic Results in last 30 days:  No results found.  Assessment and Plan  Depression Patient appears stable off of her Celexa; will continue to monitor patient off her medication  Chronic kidney disease (CKD) No recent GFR but creatinine remains stable at 1.0; will monitor intervals  Arthritis No complaints; appears comfortable; continue Tylenol 500 mg twice daily    Anne D. [name], MD

## 2017-06-02 ENCOUNTER — Encounter: Payer: Self-pay | Admitting: Internal Medicine

## 2017-06-02 DIAGNOSIS — M1990 Unspecified osteoarthritis, unspecified site: Secondary | ICD-10-CM | POA: Insufficient documentation

## 2017-06-02 DIAGNOSIS — N182 Chronic kidney disease, stage 2 (mild): Secondary | ICD-10-CM | POA: Insufficient documentation

## 2017-06-02 DIAGNOSIS — N189 Chronic kidney disease, unspecified: Secondary | ICD-10-CM | POA: Insufficient documentation

## 2017-06-02 NOTE — Assessment & Plan Note (Signed)
No recent GFR but creatinine remains stable at 1.0; will monitor intervals

## 2017-06-02 NOTE — Assessment & Plan Note (Signed)
Patient appears stable off of her Celexa; will continue to monitor patient off her medication

## 2017-06-02 NOTE — Assessment & Plan Note (Signed)
No complaints; appears comfortable; continue Tylenol 500 mg twice daily

## 2017-06-11 LAB — VITAMIN D 25 HYDROXY (VIT D DEFICIENCY, FRACTURES): VIT D 25 HYDROXY: 40.6

## 2017-06-12 ENCOUNTER — Encounter: Payer: Self-pay | Admitting: Internal Medicine

## 2017-06-12 ENCOUNTER — Non-Acute Institutional Stay (SKILLED_NURSING_FACILITY): Payer: Medicare Other | Admitting: Internal Medicine

## 2017-06-12 DIAGNOSIS — G629 Polyneuropathy, unspecified: Secondary | ICD-10-CM | POA: Diagnosis not present

## 2017-06-12 DIAGNOSIS — I2699 Other pulmonary embolism without acute cor pulmonale: Secondary | ICD-10-CM

## 2017-06-12 DIAGNOSIS — I10 Essential (primary) hypertension: Secondary | ICD-10-CM | POA: Diagnosis not present

## 2017-06-12 NOTE — Progress Notes (Signed)
[facility] Room Number: [phone] Place of Service:  SNF (31)  [name], MD  Patient Care Team: [name], MD as PCP - General (Internal Medicine)  Extended Emergency Contact Information Primary Emergency Contact: Laney Potash Address: [address]          [place], [place] [address] [place] Home Phone: [phone] Relation: Son Preferred language: English Interpreter needed? No Secondary Emergency Contact: Marcille Buffy Home Phone: [phone] Relation: Sister    Allergies: Patient has no known allergies.  Chief Complaint  Patient presents with  . Medical Management of Chronic Issues    Routine Visit    HPI: Patient is 72 y.o. female who is being seen for routine issues of history of PE, hypertension, and polyneuropathy.  Past Medical History:  Diagnosis Date  . Arthritis   . Depression   . Hypertension   . Pulmonary embolism [facility])     Past Surgical History:  Procedure Laterality Date  . ABDOMINAL HYSTERECTOMY    . BACK SURGERY    . REPLACEMENT TOTAL KNEE BILATERAL      Allergies as of 06/12/2017   No Known Allergies     Medication List        Accurate as of 06/12/17 11:59 PM. Always use your most recent med list.          acetaminophen 325 MG tablet Commonly known as:  TYLENOL Take 650 mg by mouth every 4 (four) hours as needed for mild pain or fever.   acetaminophen 500 MG tablet Commonly known as:  TYLENOL Take 500 mg by mouth 2 (two) times daily.   albuterol (2.5 MG/3ML) 0.083% nebulizer solution Commonly known as:  PROVENTIL Take 2.5 mg by nebulization every 4 (four) hours as needed for wheezing.   calcium carbonate 500 MG chewable tablet Commonly known as:  TUMS - dosed in mg elemental calcium Chew 2 tablets by mouth 3 (three) times daily as needed for indigestion or heartburn.   diphenhydrAMINE 25 MG tablet Commonly known as:  BENADRYL Take 25 mg by mouth  every 6 (six) hours as needed for itching.   furosemide 20 MG tablet Commonly known as:  LASIX Take 20 mg by mouth daily.   gabapentin 300 MG capsule Commonly known as:  NEURONTIN Take 600 mg by mouth 3 (three) times daily.   pantoprazole 40 MG tablet Commonly known as:  PROTONIX Take 40 mg by mouth daily.   potassium chloride 10 MEQ tablet Commonly known as:  K-DUR,KLOR-CON Take 10 mEq by mouth daily.       No orders of the defined types were placed in this encounter.    There is no immunization history on file for this patient.  Social History   Tobacco Use  . Smoking status: Former Smoker    Years: 55.00    Last attempt to quit: 04/2016    Years since quitting: 1.2  . Smokeless tobacco: Never Used  Substance Use Topics  . Alcohol use: No    Frequency: Never    Review of Systems  DATA OBTAINED: from patient, nurse GENERAL:  no fevers, fatigue, appetite changes SKIN: No itching, rash HEENT: No complaint RESPIRATORY: No cough, wheezing, SOB CARDIAC: No chest pain, palpitations, lower extremity edema  GI: No abdominal pain, No N/V/D or constipation, No heartburn or reflux  GU: No dysuria, frequency or urgency, or incontinence  MUSCULOSKELETAL: No unrelieved bone/joint pain NEUROLOGIC: No headache, dizziness  PSYCHIATRIC:  No overt anxiety or sadness  Vitals:   06/12/17 1110  BP: 131/79  Pulse: 95  Resp: 20  Temp: (!) 97.1 F (36.2 C)  SpO2: 96%   Body mass index is 37.85 kg/m. Physical Exam  GENERAL APPEARANCE: Alert, conversant, No acute distress  SKIN: No diaphoresis rash HEENT: Unremarkable RESPIRATORY: Breathing is even, unlabored. Lung sounds are clear   CARDIOVASCULAR: Heart RRR no murmurs, rubs or gallops. No peripheral edema  GASTROINTESTINAL: Abdomen is soft, non-tender, not distended w/ normal bowel sounds.  GENITOURINARY: Bladder non tender, not distended  MUSCULOSKELETAL: No abnormal joints or musculature NEUROLOGIC: Cranial nerves  2-12 grossly intact. Moves all extremities PSYCHIATRIC: Mood and affect appropriate to situation, no behavioral issues  Patient Active Problem List   Diagnosis Date Noted  . Chronic kidney disease (CKD) 06/02/2017  . Arthritis 06/02/2017  . GI bleed 02/15/2017  . Acute blood loss anemia 02/15/2017  . Escherichia coli urinary tract infection 02/15/2017  . Hypoxia 02/15/2017  . Edema of upper extremity 02/15/2017  . Bilateral lower extremity edema 02/15/2017  . Pulmonary embolism (HCC) 02/08/2017  . Hypertension 02/08/2017  . Acute kidney injury superimposed on chronic kidney disease (HCC) 02/08/2017  . Polyneuropathy 02/08/2017  . Depression 02/08/2017    CMP     Component Value Date/Time   NA 142 02/11/2017   K 3.8 02/11/2017   BUN 14 02/11/2017   CREATININE 1.0 02/11/2017   AST 21 01/27/2017   ALT 13 01/27/2017   ALKPHOS 66 01/27/2017   Recent Labs    01/31/17 02/05/17 02/11/17  NA 138 142 142  K 4.4 4.3 3.8  BUN 23* 19 14  CREATININE 1.2* 1.0 1.0   Recent Labs    01/27/17  AST 21  ALT 13  ALKPHOS 66   Recent Labs    02/12/17  04/01/17 04/14/17 06/23/17  WBC 10.9  --  6.7 6.1 5  NEUTROABS  --   --   --  4  --   HGB 8.0*   < > 8.4* 9.0* 8.7  HCT 26*   < > 29* 29* 28.9  MCV  --   --   --   --  78.6  PLT 226  --  226 237  --    < > = values in this interval not displayed.   No results for input(s): CHOL, LDLCALC, TRIG in the last 8760 hours.  Invalid input(s): HCL No results found for: MICROALBUR No results found for: TSH No results found for: HGBA1C No results found for: CHOL, HDL, LDLCALC, LDLDIRECT, TRIG, CHOLHDL  Significant Diagnostic Results in last 30 days:  No results found.  Assessment and Plan  Pulmonary embolism [facility](HCC) Patient was on Eliquis until May 1, patient is no longer on Eliquis; continue to monitor  Hypertension Controlled; continue Lasix 20 mg daily  Polyneuropathy Patient has no complaints that her regimen is not working and  patient appears in no discomfort; continue Neurontin 600 mg 3 times daily    Maximillion Gill D. Lyn HollingsheadAlexander, MD

## 2017-06-23 LAB — CBC
HCT: 28.9
HGB: 8.7
MCV: 78.6
WBC: 5
platelet count: 190

## 2017-06-27 ENCOUNTER — Encounter: Payer: Self-pay | Admitting: *Deleted

## 2017-07-06 ENCOUNTER — Encounter: Payer: Self-pay | Admitting: Internal Medicine

## 2017-07-06 NOTE — Assessment & Plan Note (Signed)
Patient was on Eliquis until May 1, patient is no longer on Eliquis; continue to monitor

## 2017-07-06 NOTE — Assessment & Plan Note (Signed)
Patient has no complaints that her regimen is not working and patient appears in no discomfort; continue Neurontin 600 mg 3 times daily

## 2017-07-06 NOTE — Assessment & Plan Note (Signed)
Controlled; continue Lasix 20 mg daily 

## 2017-07-09 ENCOUNTER — Non-Acute Institutional Stay (SKILLED_NURSING_FACILITY): Payer: Medicare Other | Admitting: Internal Medicine

## 2017-07-09 ENCOUNTER — Encounter: Payer: Self-pay | Admitting: Internal Medicine

## 2017-07-09 DIAGNOSIS — R600 Localized edema: Secondary | ICD-10-CM

## 2017-07-09 DIAGNOSIS — M1990 Unspecified osteoarthritis, unspecified site: Secondary | ICD-10-CM

## 2017-07-09 DIAGNOSIS — F329 Major depressive disorder, single episode, unspecified: Secondary | ICD-10-CM | POA: Diagnosis not present

## 2017-07-09 DIAGNOSIS — F32A Depression, unspecified: Secondary | ICD-10-CM

## 2017-07-09 NOTE — Progress Notes (Addendum)
[facility] Room Number: [id number] Place of Service:  SNF (31)  Margit HanksAlexander, Erik Nessel D, MD  Patient Care Team: Margit HanksAlexander, Faithanne Verret D, MD as PCP - General (Internal Medicine)  Extended Emergency Contact Information Primary Emergency Contact: Laney PotashHarris, Clarence Address: [address]          [place], [place] [address] [place] Home Phone: [phone] Relation: Son Preferred language: English Interpreter needed? No Secondary Emergency Contact: Marcille Buffyroberts, lillian Home Phone: [phone] Relation: Sister    Allergies: Patient has no known allergies.  Chief Complaint  Patient presents with  . Medical Management of Chronic Issues    Routine Visit    HPI: Patient is 72 y.o. female who is being seen for routine issues of depression, bilateral lower extremity edema, and osteoarthritis.  Past Medical History:  Diagnosis Date  . Arthritis   . Depression   . Hypertension   . Pulmonary embolism [facility](HCC)     Past Surgical History:  Procedure Laterality Date  . ABDOMINAL HYSTERECTOMY    . BACK SURGERY    . REPLACEMENT TOTAL KNEE BILATERAL      Allergies as of 07/09/2017   No Known Allergies     Medication List        Accurate as of 07/09/17 11:59 PM. Always use your most recent med list.          acetaminophen 325 MG tablet Commonly known as:  TYLENOL Take 650 mg by mouth every 4 (four) hours as needed for mild pain or fever.   acetaminophen 500 MG tablet Commonly known as:  TYLENOL Take 500 mg by mouth 2 (two) times daily.   albuterol (2.5 MG/3ML) 0.083% nebulizer solution Commonly known as:  PROVENTIL Take 2.5 mg by nebulization every 4 (four) hours as needed for wheezing.   calcium carbonate 500 MG chewable tablet Commonly known as:  TUMS - dosed in mg elemental calcium Chew 2 tablets by mouth 3 (three) times daily as needed for indigestion or heartburn.   diphenhydrAMINE 25 MG tablet Commonly known as:   BENADRYL Take 25 mg by mouth every 6 (six) hours as needed for itching.   furosemide 20 MG tablet Commonly known as:  LASIX Take 20 mg by mouth daily.   gabapentin 300 MG capsule Commonly known as:  NEURONTIN Take 600 mg by mouth 3 (three) times daily.   pantoprazole 40 MG tablet Commonly known as:  PROTONIX Take 40 mg by mouth daily.   potassium chloride 10 MEQ tablet Commonly known as:  K-DUR,KLOR-CON Take 10 mEq by mouth daily.       No orders of the defined types were placed in this encounter.    There is no immunization history on file for this patient.  Social History   Tobacco Use  . Smoking status: Former Smoker    Years: 55.00    Last attempt to quit: 04/2016    Years since quitting: 1.2  . Smokeless tobacco: Never Used  Substance Use Topics  . Alcohol use: No    Frequency: Never    Review of Systems per pt and nursing Review of Systems  All other systems reviewed and are negative.     Vitals:   07/09/17 1133  BP: 130/80  Pulse: 84  Resp: 20  SpO2: 96%   Body mass index is 40 kg/m. Physical Exam  GENERAL APPEARANCE: Alert, minimally conversant, No acute distress  SKIN: No diaphoresis rash HEENT: Unremarkable RESPIRATORY: Breathing is even,  unlabored. Lung sounds are clear   CARDIOVASCULAR: Heart RRR no murmurs, rubs or gallops. No peripheral edema  GASTROINTESTINAL: Abdomen is soft, non-tender, not distended w/ normal bowel sounds.  GENITOURINARY: Bladder non tender, not distended  MUSCULOSKELETAL: No abnormal joints or musculature NEUROLOGIC: Cranial nerves 2-12 grossly intact. Moves all extremities PSYCHIATRIC: affect normal to situation `, occasional behavioral issues  Patient Active Problem List   Diagnosis Date Noted  . Chronic kidney disease (CKD) 06/02/2017  . Arthritis 06/02/2017  . GI bleed 02/15/2017  . Acute blood loss anemia 02/15/2017  . Escherichia coli urinary tract infection 02/15/2017  . Hypoxia 02/15/2017  .  Edema of upper extremity 02/15/2017  . Bilateral lower extremity edema 02/15/2017  . Pulmonary embolism (HCC) 02/08/2017  . Hypertension 02/08/2017  . Acute kidney injury superimposed on chronic kidney disease (HCC) 02/08/2017  . Polyneuropathy 02/08/2017  . Depression 02/08/2017    CMP     Component Value Date/Time   NA 142 02/11/2017   K 3.8 02/11/2017   BUN 14 02/11/2017   CREATININE 1.0 02/11/2017   AST 21 01/27/2017   ALT 13 01/27/2017   ALKPHOS 66 01/27/2017   Recent Labs    01/31/17 02/05/17 02/11/17  NA 138 142 142  K 4.4 4.3 3.8  BUN 23* 19 14  CREATININE 1.2* 1.0 1.0   Recent Labs    01/27/17  AST 21  ALT 13  ALKPHOS 66   Recent Labs    02/12/17  04/01/17 04/14/17 06/23/17  WBC 10.9  --  6.7 6.1 5  NEUTROABS  --   --   --  4  --   HGB 8.0*   < > 8.4* 9.0* 8.7  HCT 26*   < > 29* 29* 28.9  MCV  --   --   --   --  78.6  PLT 226  --  226 237  --    < > = values in this interval not displayed.   No results for input(s): CHOL, LDLCALC, TRIG in the last 8760 hours.  Invalid input(s): HCL No results found for: MICROALBUR No results found for: TSH No results found for: HGBA1C No results found for: CHOL, HDL, LDLCALC, LDLDIRECT, TRIG, CHOLHDL  Significant Diagnostic Results in last 30 days:  No results found.  Assessment and Plan  Depression Continue stable on no meds; continue supportive care  Bilateral lower extremity edema Baseline; continue Lasix 20 mg daily  Arthritis Patient appears comfortable; continue Tylenol 500 mg twice daily    Justen Fonda D. [name], MD

## 2017-07-22 ENCOUNTER — Encounter: Payer: Self-pay | Admitting: Internal Medicine

## 2017-07-22 NOTE — Assessment & Plan Note (Signed)
Continue stable on no meds; continue supportive care

## 2017-07-22 NOTE — Assessment & Plan Note (Signed)
Patient appears comfortable; continue Tylenol 500 mg twice daily

## 2017-07-22 NOTE — Assessment & Plan Note (Signed)
Baseline; continue Lasix 20 mg daily

## 2017-07-23 LAB — CBC AND DIFFERENTIAL
HEMATOCRIT: 31 — AB (ref 36–46)
HEMOGLOBIN: 9.1 — AB (ref 12.0–16.0)
Hemoglobin: 9.1 — AB (ref 12.0–16.0)
Platelets: 202 (ref 150–399)
WBC: 6.6
WBC: 6.6

## 2017-08-08 LAB — BASIC METABOLIC PANEL
BUN: 27 — AB (ref 4–21)
Creatinine: 1.1 (ref 0.5–1.1)
Glucose: 135
Potassium: 4.4 (ref 3.4–5.3)
Sodium: 148 — AB (ref 137–147)

## 2017-08-15 ENCOUNTER — Encounter: Payer: Self-pay | Admitting: Internal Medicine

## 2017-08-15 ENCOUNTER — Non-Acute Institutional Stay (SKILLED_NURSING_FACILITY): Payer: Medicare Other | Admitting: Internal Medicine

## 2017-08-15 DIAGNOSIS — R5381 Other malaise: Secondary | ICD-10-CM | POA: Diagnosis not present

## 2017-08-15 DIAGNOSIS — R0902 Hypoxemia: Secondary | ICD-10-CM

## 2017-08-15 DIAGNOSIS — R2689 Other abnormalities of gait and mobility: Secondary | ICD-10-CM

## 2017-08-15 NOTE — Progress Notes (Signed)
[facility] Room Number: [phone] Place of Service:  SNF [phone]  [name] D. Lyn HollingsheadAlexander, MD  Patient Care Team: Margit HanksAlexander, Malyia Moro D, MD as PCP - General (Internal Medicine)  Extended Emergency Contact Information Primary Emergency Contact: Laney PotashHarris, Clarence Address: [address]          [place], [place] [address] [place] Home Phone: [phone] Relation: Son Preferred language: English Interpreter needed? No Secondary Emergency Contact: Marcille Buffyroberts, lillian Home Phone: [phone] Relation: Sister    Allergies: Patient has no known allergies.  Chief Complaint  Patient presents with  . Medical Management of Chronic Issues    Routine Visit    HPI: Patient is 72 y.o. female who being seen for his functional status, decreased functional mobility, and hypoxia.  She has not recovered since her prior hospitalization where she was diagnosed with bilateral PE.  These diagnoses supersede any mental health diagnosis that she had very received in the past.  Recommended level of care would be a skilled nursing facility.  Past Medical History:  Diagnosis Date  . Arthritis   . Depression   . Hypertension   . Pulmonary embolism [facility](HCC)     Past Surgical History:  Procedure Laterality Date  . ABDOMINAL HYSTERECTOMY    . BACK SURGERY    . REPLACEMENT TOTAL KNEE BILATERAL      Allergies as of 08/15/2017   No Known Allergies     Medication List        Accurate as of 08/15/17 11:59 PM. Always use your most recent med list.          acetaminophen 325 MG tablet Commonly known as:  TYLENOL Take 650 mg by mouth every 4 (four) hours as needed for mild pain or fever.   acetaminophen 500 MG tablet Commonly known as:  TYLENOL Take 500 mg by mouth 2 (two) times daily.   albuterol (2.5 MG/3ML) 0.083% nebulizer solution Commonly known as:  PROVENTIL Take 2.5 mg by nebulization every 4 (four) hours as needed for wheezing.   calcium  carbonate 500 MG chewable tablet Commonly known as:  TUMS - dosed in mg elemental calcium Chew 2 tablets by mouth 3 (three) times daily as needed for indigestion or heartburn.   diphenhydrAMINE 25 MG tablet Commonly known as:  BENADRYL Take 25 mg by mouth every 6 (six) hours as needed for itching.   furosemide 20 MG tablet Commonly known as:  LASIX Take 20 mg by mouth daily.   gabapentin 300 MG capsule Commonly known as:  NEURONTIN Take 600 mg by mouth 3 (three) times daily.   pantoprazole 40 MG tablet Commonly known as:  PROTONIX Take 40 mg by mouth daily.   potassium chloride 10 MEQ tablet Commonly known as:  K-DUR,KLOR-CON Take 10 mEq by mouth daily.       No orders of the defined types were placed in this encounter.    There is no immunization history on file for this patient.  Social History   Tobacco Use  . Smoking status: Former Smoker    Years: 55.00    Last attempt to quit: 04/2016    Years since quitting: 1.3  . Smokeless tobacco: Never Used  Substance Use Topics  . Alcohol use: No    Frequency: Never    Review of Systems  DATA OBTAINED: from patient, nurse GENERAL:  no fevers, fatigue, appetite changes SKIN: No itching, rash HEENT: No complaint RESPIRATORY: No cough, wheezing, SOB  CARDIAC: No chest pain, palpitations, lower extremity edema  GI: No abdominal pain, No N/V/D or constipation, No heartburn or reflux  GU: No dysuria, frequency or urgency, or incontinence  MUSCULOSKELETAL: No unrelieved bone/joint pain NEUROLOGIC: No headache, dizziness  PSYCHIATRIC: No overt anxiety or sadness  Vitals:   08/15/17 1324  BP: 122/66  Pulse: 74  Resp: 20  Temp: 98.4 F (36.9 C)   Body mass index is 40.62 kg/m. Physical Exam  GENERAL APPEARANCE: Alert, conversant, No acute distress  SKIN: No diaphoresis rash HEENT: Unremarkable RESPIRATORY: Breathing is even, unlabored. Lung sounds are clear: Wearing nasal cannula CARDIOVASCULAR: Heart RRR  no murmurs, rubs or gallops. No peripheral edema  GASTROINTESTINAL: Abdomen is soft, non-tender, not distended w/ normal bowel sounds.  GENITOURINARY: Bladder non tender, not distended  MUSCULOSKELETAL: No abnormal joints or musculature NEUROLOGIC: Cranial nerves 2-12 grossly intact. Moves all extremities PSYCHIATRIC: Mood and affect appropriate to situation, no behavioral issues  Patient Active Problem List   Diagnosis Date Noted  . Declining functional status 08/18/2017  . Decreased functional mobility 08/18/2017  . Chronic kidney disease (CKD) 06/02/2017  . Arthritis 06/02/2017  . GI bleed 02/15/2017  . Acute blood loss anemia 02/15/2017  . Escherichia coli urinary tract infection 02/15/2017  . Hypoxia 02/15/2017  . Edema of upper extremity 02/15/2017  . Bilateral lower extremity edema 02/15/2017  . Pulmonary embolism (HCC) 02/08/2017  . Hypertension 02/08/2017  . Acute kidney injury superimposed on chronic kidney disease (HCC) 02/08/2017  . Polyneuropathy 02/08/2017  . Depression 02/08/2017    CMP     Component Value Date/Time   NA 148 (A) 08/08/2017   K 4.4 08/08/2017   BUN 27 (A) 08/08/2017   CREATININE 1.1 08/08/2017   AST 21 01/27/2017   ALT 13 01/27/2017   ALKPHOS 66 01/27/2017   Recent Labs    02/05/17 02/11/17 08/08/17  NA 142 142 148*  K 4.3 3.8 4.4  BUN 19 14 27*  CREATININE 1.0 1.0 1.1   Recent Labs    01/27/17  AST 21  ALT 13  ALKPHOS 66   Recent Labs    04/01/17 04/14/17 06/23/17 07/23/17  WBC 6.7 6.1 5 6.6  6.6  NEUTROABS  --  4  --   --   HGB 8.4* 9.0* 8.7 9.1*  9.1*  HCT 29* 29* 28.9 31*  MCV  --   --  78.6  --   PLT 226 237  --  202   No results for input(s): CHOL, LDLCALC, TRIG in the last 8760 hours.  Invalid input(s): HCL No results found for: MICROALBUR No results found for: TSH No results found for: HGBA1C No results found for: CHOL, HDL, LDLCALC, LDLDIRECT, TRIG, CHOLHDL  Significant Diagnostic Results in last 30 days:    No results found.  Assessment and Plan  Declining functional status Since returning from her last hospitalization patient has been unable to perform her ADLs independently; she requires feeding and help with all ADLs; continue supportive care me: Patient will require skilled nursing facility care  Decreased functional mobility Is wheelchair-bound, but is unable to move her wheelchair up and down the hall with her feet and arms due to weakness and shortness of breath; continue supportive care; patient will require skilled nursing care  Hypoxia She continues to have need for O2 nasal cannula 2 L as her pulmonary embolus; have nursing do baseline exertional oxygen levels on her today: Consider sending patient to pulmonologist     Randon Goldsmith. [name],  MD

## 2017-08-18 ENCOUNTER — Encounter: Payer: Self-pay | Admitting: Internal Medicine

## 2017-08-18 DIAGNOSIS — R5381 Other malaise: Secondary | ICD-10-CM | POA: Insufficient documentation

## 2017-08-18 DIAGNOSIS — R2689 Other abnormalities of gait and mobility: Secondary | ICD-10-CM | POA: Insufficient documentation

## 2017-08-18 LAB — BASIC METABOLIC PANEL
BUN: 22 — AB (ref 4–21)
Creatinine: 0.9 (ref 0.5–1.1)
GLUCOSE: 124
Potassium: 4.5 (ref 3.4–5.3)
Sodium: 145 (ref 137–147)

## 2017-08-18 NOTE — Assessment & Plan Note (Addendum)
Since returning from her last hospitalization patient has been unable to perform her ADLs independently; she requires feeding and help with all ADLs; continue supportive care me: Patient will require skilled nursing facility care

## 2017-08-18 NOTE — Assessment & Plan Note (Addendum)
Is wheelchair-bound, but is unable to move her wheelchair up and down the hall with her feet and arms due to weakness and shortness of breath; continue supportive care; patient will require skilled nursing care

## 2017-08-18 NOTE — Assessment & Plan Note (Signed)
She continues to have need for O2 nasal cannula 2 L as her pulmonary embolus; have nursing do baseline exertional oxygen levels on her today: Consider sending patient to pulmonologist

## 2017-08-25 LAB — CBC AND DIFFERENTIAL
HCT: 31 — AB (ref 36–46)
Hemoglobin: 9.2 — AB (ref 12.0–16.0)
Platelets: 221 (ref 150–399)
WBC: 7.1

## 2017-09-11 ENCOUNTER — Non-Acute Institutional Stay (SKILLED_NURSING_FACILITY): Payer: Medicare Other | Admitting: Internal Medicine

## 2017-09-11 ENCOUNTER — Encounter: Payer: Self-pay | Admitting: Internal Medicine

## 2017-09-11 DIAGNOSIS — G629 Polyneuropathy, unspecified: Secondary | ICD-10-CM | POA: Diagnosis not present

## 2017-09-11 DIAGNOSIS — N183 Chronic kidney disease, stage 3 unspecified: Secondary | ICD-10-CM

## 2017-09-11 DIAGNOSIS — D62 Acute posthemorrhagic anemia: Secondary | ICD-10-CM | POA: Diagnosis not present

## 2017-09-11 NOTE — Progress Notes (Signed)
[facility] Room Number: [phone] Place of Service:  SNF [phone])  Randon Goldsmith. [name], MD  Patient Care Team: [name], MD as PCP - General (Internal Medicine)  Extended Emergency Contact Information Primary Emergency Contact: Laney Potash Address: [address]          [place], [place] [address] [place] Home Phone: [phone] Relation: Son Preferred language: English Interpreter needed? No Secondary Emergency Contact: Marcille Buffy Home Phone: [phone] Relation: Sister    Allergies: Patient has no known allergies.  Chief Complaint  Patient presents with  . Medical Management of Chronic Issues    Routine Visit    HPI: Patient is 72 y.o. female who is being seen for routine issues of chronic kidney disease stage III, iron deficiency anemia, and polyneuropathy.  Past Medical History:  Diagnosis Date  . Arthritis   . Depression   . Hypertension   . Pulmonary embolism [facility])     Past Surgical History:  Procedure Laterality Date  . ABDOMINAL HYSTERECTOMY    . BACK SURGERY    . REPLACEMENT TOTAL KNEE BILATERAL      Allergies as of 09/11/2017   No Known Allergies     Medication List        Accurate as of 09/11/17 11:59 PM. Always use your most recent med list.          acetaminophen 325 MG tablet Commonly known as:  TYLENOL Take 650 mg by mouth every 4 (four) hours as needed for mild pain or fever.   acetaminophen 500 MG tablet Commonly known as:  TYLENOL Take 500 mg by mouth 2 (two) times daily.   albuterol (2.5 MG/3ML) 0.083% nebulizer solution Commonly known as:  PROVENTIL Take 2.5 mg by nebulization every 4 (four) hours as needed for wheezing.   calcium carbonate 500 MG chewable tablet Commonly known as:  TUMS - dosed in mg elemental calcium Chew 2 tablets by mouth 3 (three) times daily as needed for indigestion or heartburn.   CLARITIN 10 MG tablet Generic drug:   loratadine Take 10 mg by mouth daily.   FLONASE SENSIMIST 27.5 MCG/SPRAY nasal spray Generic drug:  fluticasone Place 2 sprays into the nose daily. apply 2 sprays to both nostrils daily prn nasal congestion/ nose related allergy symptoms   furosemide 20 MG tablet Commonly known as:  LASIX Take 20 mg by mouth daily.   gabapentin 300 MG capsule Commonly known as:  NEURONTIN Take 600 mg by mouth 3 (three) times daily.   pantoprazole 40 MG tablet Commonly known as:  PROTONIX Take 40 mg by mouth daily.   potassium chloride 10 MEQ tablet Commonly known as:  K-DUR,KLOR-CON Take 10 mEq by mouth daily.       No orders of the defined types were placed in this encounter.    There is no immunization history on file for this patient.  Social History   Tobacco Use  . Smoking status: Former Smoker    Years: 55.00    Last attempt to quit: 04/2016    Years since quitting: 1.4  . Smokeless tobacco: Never Used  Substance Use Topics  . Alcohol use: No    Frequency: Never    Review of Systems  DATA OBTAINED: from patient, nurse GENERAL:  no fevers, fatigue, appetite changes SKIN: No itching, rash HEENT: No complaint RESPIRATORY: No cough, wheezing, SOB CARDIAC: No chest pain, palpitations, lower extremity edema  GI: No abdominal  pain, No N/V/D or constipation, No heartburn or reflux  GU: No dysuria, frequency or urgency, or incontinence  MUSCULOSKELETAL: No unrelieved bone/joint pain NEUROLOGIC: No headache, dizziness  PSYCHIATRIC: No overt anxiety or sadness  Vitals:   09/11/17 1257  BP: 123/76  Pulse: 98  Resp: 19  Temp: 98 F (36.7 C)  SpO2: 96%   Body mass index is 40.62 kg/m. Physical Exam  GENERAL APPEARANCE: Alert, conversant, No acute distress  SKIN: No diaphoresis rash HEENT: Unremarkable RESPIRATORY: Breathing is even, unlabored. Lung sounds are clear   CARDIOVASCULAR: Heart RRR no murmurs, rubs or gallops.  Some peripheral edema  GASTROINTESTINAL:  Abdomen is soft, non-tender, not distended w/ normal bowel sounds.  GENITOURINARY: Bladder non tender, not distended  MUSCULOSKELETAL: No abnormal joints or musculature NEUROLOGIC: Cranial nerves 2-12 grossly intact. Moves all extremities PSYCHIATRIC: Mood and affect appropriate to situation, no behavioral issues  Patient Active Problem List   Diagnosis Date Noted  . Declining functional status 08/18/2017  . Decreased functional mobility 08/18/2017  . Chronic kidney disease (CKD) 06/02/2017  . Arthritis 06/02/2017  . GI bleed 02/15/2017  . Acute blood loss anemia 02/15/2017  . Escherichia coli urinary tract infection 02/15/2017  . Hypoxia 02/15/2017  . Edema of upper extremity 02/15/2017  . Bilateral lower extremity edema 02/15/2017  . Pulmonary embolism (HCC) 02/08/2017  . Hypertension 02/08/2017  . Acute kidney injury superimposed on chronic kidney disease (HCC) 02/08/2017  . Polyneuropathy 02/08/2017  . Depression 02/08/2017    CMP     Component Value Date/Time   NA 145 08/18/2017   K 4.5 08/18/2017   BUN 22 (A) 08/18/2017   CREATININE 0.9 08/18/2017   AST 21 01/27/2017   ALT 13 01/27/2017   ALKPHOS 66 01/27/2017   Recent Labs    02/11/17 08/08/17 08/18/17  NA 142 148* 145  K 3.8 4.4 4.5  BUN 14 27* 22*  CREATININE 1.0 1.1 0.9   Recent Labs    01/27/17  AST 21  ALT 13  ALKPHOS 66   Recent Labs    04/14/17 06/23/17 07/23/17 08/25/17  WBC 6.1 5 6.6  6.6 7.1  NEUTROABS 4  --   --   --   HGB 9.0* 8.7 9.1*  9.1* 9.2*  HCT 29* 28.9 31* 31*  MCV  --  78.6  --   --   PLT 237  --  202 221   No results for input(s): CHOL, LDLCALC, TRIG in the last 8760 hours.  Invalid input(s): HCL No results found for: MICROALBUR No results found for: TSH No results found for: HGBA1C No results found for: CHOL, HDL, LDLCALC, LDLDIRECT, TRIG, CHOLHDL  Significant Diagnostic Results in last 30 days:  No results found.  Assessment and Plan  Chronic kidney disease  (CKD) Creatinine 0.9 which is improved actually from prior; monitor intervals  Acute blood loss anemia Most recent hemoglobin 9.2 which is excellent; patient is MCV though is still 79 and she is not on iron; will start her iron 325 mg daily  Polyneuropathy Over this past several months regimen has had to be increased several times; most recent increase is 1200 mg 3 times daily which patient says is working     Programmer, applications D. [name], MD

## 2017-09-13 ENCOUNTER — Encounter: Payer: Self-pay | Admitting: Internal Medicine

## 2017-09-13 NOTE — Assessment & Plan Note (Signed)
Over this past several months regimen has had to be increased several times; most recent increase is 1200 mg 3 times daily which patient says is working

## 2017-09-13 NOTE — Assessment & Plan Note (Signed)
Most recent hemoglobin 9.2 which is excellent; patient is MCV though is still 79 and she is not on iron; will start her iron 325 mg daily

## 2017-09-13 NOTE — Assessment & Plan Note (Signed)
Creatinine 0.9 which is improved actually from prior; monitor intervals

## 2017-09-16 ENCOUNTER — Encounter: Payer: Self-pay | Admitting: Internal Medicine

## 2017-09-16 ENCOUNTER — Non-Acute Institutional Stay (SKILLED_NURSING_FACILITY): Payer: Medicare Other | Admitting: Internal Medicine

## 2017-09-16 DIAGNOSIS — R635 Abnormal weight gain: Secondary | ICD-10-CM

## 2017-09-16 NOTE — Progress Notes (Signed)
Location:  [facility] Room Number: 216-W Place of Service:  SNF (31)  [name], MD  Patient Care Team: [name], MD as PCP - General (Internal Medicine)  Extended Emergency Contact Information Primary Emergency Contact: Laney Potash Address: [address]          [place], [place] [address] Darden Amber of [place] Home Phone: [phone] Relation: Son Preferred language: English Interpreter needed? No Secondary Emergency Contact: Marcille Buffy Home Phone: [phone] Relation: Sister    Allergies: Patient has no known allergies.  Chief Complaint  Patient presents with  . Acute Visit    Patient is seen due to weight gain    HPI: Patient is 72 y.o. female who is being seen for acute weight gain.  Her weight is gone from 204 pounds to 215 pounds since July 11/2017.  Patient has no edema but she is wearing TED hose patient with dementia so cannot really give a history of present illness or review of systems.  Past Medical History:  Diagnosis Date  . Arthritis   . Depression   . Hypertension   . Pulmonary embolism [facility])     Past Surgical History:  Procedure Laterality Date  . ABDOMINAL HYSTERECTOMY    . BACK SURGERY    . REPLACEMENT TOTAL KNEE BILATERAL      Allergies as of 09/16/2017   No Known Allergies     Medication List        Accurate as of 09/16/17 11:59 PM. Always use your most recent med list.          acetaminophen 325 MG tablet Commonly known as:  TYLENOL Take 650 mg by mouth every 4 (four) hours as needed for mild pain or fever.   acetaminophen 500 MG tablet Commonly known as:  TYLENOL Take 500 mg by mouth 2 (two) times daily.   albuterol (2.5 MG/3ML) 0.083% nebulizer solution Commonly known as:  PROVENTIL Take 2.5 mg by nebulization every 4 (four) hours as needed for wheezing.   calcium carbonate 500 MG chewable tablet Commonly known as:  TUMS - dosed in mg elemental calcium Chew  2 tablets by mouth 3 (three) times daily as needed for indigestion or heartburn.   CLARITIN 10 MG tablet Generic drug:  loratadine Take 10 mg by mouth daily.   FLONASE SENSIMIST 27.5 MCG/SPRAY nasal spray Generic drug:  fluticasone Place 2 sprays into the nose daily. apply 2 sprays to both nostrils daily prn nasal congestion/ nose related allergy symptoms   furosemide 20 MG tablet Commonly known as:  LASIX Take 20 mg by mouth daily.   gabapentin 300 MG capsule Commonly known as:  NEURONTIN Take 600 mg by mouth 3 (three) times daily.   pantoprazole 40 MG tablet Commonly known as:  PROTONIX Take 40 mg by mouth daily.   potassium chloride 10 MEQ tablet Commonly known as:  K-DUR,KLOR-CON Take 10 mEq by mouth daily.       No orders of the defined types were placed in this encounter.   Immunization History  Administered Date(s) Administered  . Influenza-Unspecified 09/07/2016    Social History   Tobacco Use  . Smoking status: Former Smoker    Years: 55.00    Last attempt to quit: 04/2016    Years since quitting: 1.4  . Smokeless tobacco: Never Used  Substance Use Topics  . Alcohol use: No    Frequency: Never    Review of Systems unable to obtain secondary  to dementia; nursing-as per history present illness     Vitals:   09/16/17 1124  BP: 136/65  Pulse: 96  Resp: 20  Temp: 97.6 F (36.4 C)  SpO2: 97%   Body mass index is 42.11 kg/m. Physical Exam  GENERAL APPEARANCE: Alert, moderately conversant, No acute distress  SKIN: No diaphoresis rash HEENT: Unremarkable RESPIRATORY: Breathing is even, unlabored. Lung sounds are clear   CARDIOVASCULAR: Heart RRR no murmurs, rubs or gallops. No peripheral edema  GASTROINTESTINAL: Abdomen is soft, non-tender, not distended w/ normal bowel sounds.  GENITOURINARY: Bladder non tender, not distended  MUSCULOSKELETAL: No abnormal joints or musculature NEUROLOGIC: Cranial nerves 2-12 grossly intact. Moves all  extremities PSYCHIATRIC: Dementia, no behavioral issues  Patient Active Problem List   Diagnosis Date Noted  . Declining functional status 08/18/2017  . Decreased functional mobility 08/18/2017  . Chronic kidney disease (CKD) 06/02/2017  . Arthritis 06/02/2017  . GI bleed 02/15/2017  . Acute blood loss anemia 02/15/2017  . Escherichia coli urinary tract infection 02/15/2017  . Hypoxia 02/15/2017  . Edema of upper extremity 02/15/2017  . Bilateral lower extremity edema 02/15/2017  . Pulmonary embolism (HCC) 02/08/2017  . Hypertension 02/08/2017  . Acute kidney injury superimposed on chronic kidney disease (HCC) 02/08/2017  . Polyneuropathy 02/08/2017  . Depression 02/08/2017    CMP     Component Value Date/Time   NA 145 08/18/2017   K 4.5 08/18/2017   BUN 22 (A) 08/18/2017   CREATININE 0.9 08/18/2017   AST 21 01/27/2017   ALT 13 01/27/2017   ALKPHOS 66 01/27/2017   Recent Labs    02/11/17 08/08/17 08/18/17  NA 142 148* 145  K 3.8 4.4 4.5  BUN 14 27* 22*  CREATININE 1.0 1.1 0.9   Recent Labs    01/27/17  AST 21  ALT 13  ALKPHOS 66   Recent Labs    04/14/17 06/23/17 07/23/17 08/25/17  WBC 6.1 5 6.6  6.6 7.1  NEUTROABS 4  --   --   --   HGB 9.0* 8.7 9.1*  9.1* 9.2*  HCT 29* 28.9 31* 31*  MCV  --  78.6  --   --   PLT 237  --  202 221   No results for input(s): CHOL, LDLCALC, TRIG in the last 8760 hours.  Invalid input(s): HCL No results found for: MICROALBUR No results found for: TSH No results found for: HGBA1C No results found for: CHOL, HDL, LDLCALC, LDLDIRECT, TRIG, CHOLHDL  Significant Diagnostic Results in last 30 days:  No results found.  Assessment and Plan  Weight gain- increase patient's Lasix to 40 mg daily for 7 days then back to 20 mg daily; BMP in 7 days     Merrilee Seashore, MD  : Provider:  Randon Goldsmith. [name], MD Location:  [facility] Room Number: [id number]-W Place of Service:  SNF [phone])  PCP: [name], MD Patient Care Team: [name], MD as PCP - General (Internal Medicine)  Extended Emergency Contact Information Primary Emergency Contact: Laney Potash Address: [address]          [place], [place] [address] D[place] of [place] Home Phone: [phone] Relation: Son Preferred language: English Interpreter needed? No Secondary Emergency Contact: Marcille Buffy Home Phone: [phone] Relation: Sister     Allergies: Patient has no known allergies.  Chief Complaint  Patient presents with  . Acute Visit    Patient is seen due to weight  gain    HPI: Patient is 72 y.o. female who  Past Medical History:  Diagnosis Date  . Arthritis   . Depression   . Hypertension   . Pulmonary embolism [facility])     Past Surgical History:  Procedure Laterality Date  . ABDOMINAL HYSTERECTOMY    . BACK SURGERY    . REPLACEMENT TOTAL KNEE BILATERAL      Allergies as of 09/16/2017   No Known Allergies     Medication List        Accurate as of 09/16/17 11:59 PM. Always use your most recent med list.          acetaminophen 325 MG tablet Commonly known as:  TYLENOL Take 650 mg by mouth every 4 (four) hours as needed for mild pain or fever.   acetaminophen 500 MG tablet Commonly known as:  TYLENOL Take 500 mg by mouth 2 (two) times daily.   albuterol (2.5 MG/3ML) 0.083% nebulizer solution Commonly known as:  PROVENTIL Take 2.5 mg by nebulization every 4 (four) hours as needed for wheezing.   calcium carbonate 500 MG chewable tablet Commonly known as:  TUMS - dosed in mg elemental calcium Chew 2 tablets by mouth 3 (three) times daily as needed for indigestion or heartburn.   CLARITIN 10 MG tablet Generic drug:  loratadine Take 10 mg by mouth daily.   FLONASE SENSIMIST 27.5 MCG/SPRAY nasal spray Generic drug:  fluticasone Place 2 sprays into the nose daily. apply 2 sprays to both nostrils daily prn nasal congestion/ nose related allergy symptoms     furosemide 20 MG tablet Commonly known as:  LASIX Take 20 mg by mouth daily.   gabapentin 300 MG capsule Commonly known as:  NEURONTIN Take 600 mg by mouth 3 (three) times daily.   pantoprazole 40 MG tablet Commonly known as:  PROTONIX Take 40 mg by mouth daily.   potassium chloride 10 MEQ tablet Commonly known as:  K-DUR,KLOR-CON Take 10 mEq by mouth daily.       No orders of the defined types were placed in this encounter.   Immunization History  Administered Date(s) Administered  . Influenza-Unspecified 09/07/2016    Social History   Tobacco Use  . Smoking status: Former Smoker    Years: 55.00    Last attempt to quit: 04/2016    Years since quitting: 1.4  . Smokeless tobacco: Never Used  Substance Use Topics  . Alcohol use: No    Frequency: Never    Family history is   Family History  Problem Relation Age of Onset  . Cancer Mother   . Hypertension Mother   . Diabetes Mother   . Hypertension Father   . Cancer Sister   . Diabetes Sister   . Diabetes Maternal Grandmother   . Hypertension Maternal Grandmother   . Diabetes Maternal Grandfather   . Hypertension Maternal Grandfather       Review of Systems  DATA OBTAINED: from patient, nurse, medical record, family member GENERAL:  no fevers, fatigue, appetite changes SKIN: No itching, or rash EYES: No eye pain, redness, discharge EARS: No earache, tinnitus, change in hearing NOSE: No congestion, drainage or bleeding  MOUTH/THROAT: No mouth or tooth pain, No sore throat RESPIRATORY: No cough, wheezing, SOB CARDIAC: No chest pain, palpitations, lower extremity edema  GI: No abdominal pain, No N/V/D or constipation, No heartburn or reflux  GU: No dysuria, frequency or urgency, or incontinence  MUSCULOSKELETAL: No unrelieved bone/joint pain NEUROLOGIC: No headache, dizziness or  focal weakness PSYCHIATRIC: No c/o anxiety or sadness   Vitals:   09/16/17 1124  BP: 136/65  Pulse: 96  Resp: 20   Temp: 97.6 F (36.4 C)  SpO2: 97%    SpO2 Readings from Last 1 Encounters:  09/16/17 97%   Body mass index is 42.11 kg/m.     Physical Exam  GENERAL APPEARANCE: Alert, conversant,  No acute distress.  SKIN: No diaphoresis rash HEAD: Normocephalic, atraumatic  EYES: Conjunctiva/lids clear. Pupils round, reactive. EOMs intact.  EARS: External exam WNL, canals clear. Hearing grossly normal.  NOSE: No deformity or discharge.  MOUTH/THROAT: Lips w/o lesions  RESPIRATORY: Breathing is even, unlabored. Lung sounds are clear   CARDIOVASCULAR: Heart RRR no murmurs, rubs or gallops. No peripheral edema.   GASTROINTESTINAL: Abdomen is soft, non-tender, not distended w/ normal bowel sounds. GENITOURINARY: Bladder non tender, not distended  MUSCULOSKELETAL: No abnormal joints or musculature NEUROLOGIC:  Cranial nerves 2-12 grossly intact. Moves all extremities  PSYCHIATRIC: Mood and affect appropriate to situation, no behavioral issues  Patient Active Problem List   Diagnosis Date Noted  . Declining functional status 08/18/2017  . Decreased functional mobility 08/18/2017  . Chronic kidney disease (CKD) 06/02/2017  . Arthritis 06/02/2017  . GI bleed 02/15/2017  . Acute blood loss anemia 02/15/2017  . Escherichia coli urinary tract infection 02/15/2017  . Hypoxia 02/15/2017  . Edema of upper extremity 02/15/2017  . Bilateral lower extremity edema 02/15/2017  . Pulmonary embolism (HCC) 02/08/2017  . Hypertension 02/08/2017  . Acute kidney injury superimposed on chronic kidney disease (HCC) 02/08/2017  . Polyneuropathy 02/08/2017  . Depression 02/08/2017      Labs reviewed: Basic Metabolic Panel:    Component Value Date/Time   NA 145 08/18/2017   K 4.5 08/18/2017   BUN 22 (A) 08/18/2017   CREATININE 0.9 08/18/2017   AST 21 01/27/2017   ALT 13 01/27/2017   ALKPHOS 66 01/27/2017    Recent Labs    02/11/17 08/08/17 08/18/17  NA 142 148* 145  K 3.8 4.4 4.5  BUN 14 27*  22*  CREATININE 1.0 1.1 0.9   Liver Function Tests: Recent Labs    01/27/17  AST 21  ALT 13  ALKPHOS 66   No results for input(s): LIPASE, AMYLASE in the last 8760 hours. No results for input(s): AMMONIA in the last 8760 hours. CBC: Recent Labs    04/14/17 06/23/17 07/23/17 08/25/17  WBC 6.1 5 6.6  6.6 7.1  NEUTROABS 4  --   --   --   HGB 9.0* 8.7 9.1*  9.1* 9.2*  HCT 29* 28.9 31* 31*  MCV  --  78.6  --   --   PLT 237  --  202 221   Lipid No results for input(s): CHOL, HDL, LDLCALC, TRIG in the last 8760 hours.  Cardiac Enzymes: No results for input(s): CKTOTAL, CKMB, CKMBINDEX, TROPONINI in the last 8760 hours. BNP: No results for input(s): BNP in the last 8760 hours. No results found for: MICROALBUR No results found for: HGBA1C No results found for: TSH No results found for: VITAMINB12 No results found for: FOLATE No results found for: IRON, TIBC, FERRITIN  Imaging and Procedures obtained prior to SNF admission: Patient was never admitted.   Not all labs, radiology exams or other studies done during hospitalization come through on my EPIC note; however they are reviewed by me.    Assessment and Plan    Randon Goldsmith. [name], MD

## 2017-09-21 ENCOUNTER — Encounter: Payer: Self-pay | Admitting: Internal Medicine

## 2017-09-22 LAB — BASIC METABOLIC PANEL
BUN: 29 — AB (ref 4–21)
CREATININE: 0.9 (ref 0.5–1.1)
Glucose: 140
Potassium: 4.5 (ref 3.4–5.3)
Sodium: 145 (ref 137–147)

## 2017-09-23 LAB — CBC AND DIFFERENTIAL
HCT: 31 — AB (ref 36–46)
Hemoglobin: 9.4 — AB (ref 12.0–16.0)
Platelets: 243 (ref 150–399)
WBC: 6.7

## 2017-10-09 ENCOUNTER — Encounter: Payer: Self-pay | Admitting: Internal Medicine

## 2017-10-09 ENCOUNTER — Non-Acute Institutional Stay (SKILLED_NURSING_FACILITY): Payer: Medicare Other | Admitting: Internal Medicine

## 2017-10-09 DIAGNOSIS — F329 Major depressive disorder, single episode, unspecified: Secondary | ICD-10-CM

## 2017-10-09 DIAGNOSIS — M1990 Unspecified osteoarthritis, unspecified site: Secondary | ICD-10-CM | POA: Diagnosis not present

## 2017-10-09 DIAGNOSIS — F32A Depression, unspecified: Secondary | ICD-10-CM

## 2017-10-09 DIAGNOSIS — I10 Essential (primary) hypertension: Secondary | ICD-10-CM | POA: Diagnosis not present

## 2017-10-23 LAB — CBC AND DIFFERENTIAL
HEMATOCRIT: 31 — AB (ref 36–46)
Hemoglobin: 9.3 — AB (ref 12.0–16.0)
PLATELETS: 232 (ref 150–399)
WBC: 7.1

## 2017-10-24 LAB — HM DIABETES FOOT EXAM

## 2017-11-05 ENCOUNTER — Encounter: Payer: Self-pay | Admitting: Internal Medicine

## 2017-11-05 NOTE — Progress Notes (Signed)
Location:  [facility] Room Number: [phone] Place of Service:  SNF (31)  [name], MD  Patient Care Team: [name], MD as PCP - General (Internal Medicine)  Extended Emergency Contact Information Primary Emergency Contact: Laney Potash Address: [address]          [place], [place] [address] D[place] of [place] Home Phone: [phone] Relation: Son Preferred language: English Interpreter needed? No Secondary Emergency Contact: Marcille Buffy Home Phone: [phone] Relation: Sister    Allergies: Patient has no known allergies.  Chief Complaint  Patient presents with  . Medical Management of Chronic Issues    Routine Visit  . Health Maintenance    Influenza Vacc.     HPI: Patient is 72 y.o. female who is being seen for routine issues of hypertension, depression, and osteoarthritis.  Past Medical History:  Diagnosis Date  . Arthritis   . Depression   . Hypertension   . Pulmonary embolism [facility])     Past Surgical History:  Procedure Laterality Date  . ABDOMINAL HYSTERECTOMY    . BACK SURGERY    . REPLACEMENT TOTAL KNEE BILATERAL      Allergies as of 10/09/2017   No Known Allergies     Medication List        Accurate as of 10/09/17 11:59 PM. Always use your most recent med list.          acetaminophen 325 MG tablet Commonly known as:  TYLENOL Take 650 mg by mouth every 4 (four) hours as needed for mild pain or fever.   acetaminophen 500 MG tablet Commonly known as:  TYLENOL Take 500 mg by mouth 2 (two) times daily.   albuterol (2.5 MG/3ML) 0.083% nebulizer solution Commonly known as:  PROVENTIL Take 2.5 mg by nebulization every 4 (four) hours as needed for wheezing.   calcium carbonate 500 MG chewable tablet Commonly known as:  TUMS - dosed in mg elemental calcium Chew 2 tablets by mouth 3 (three) times daily as needed for indigestion or heartburn.   CLARITIN 10 MG tablet Generic drug:   loratadine Take 10 mg by mouth daily.   FLONASE SENSIMIST 27.5 MCG/SPRAY nasal spray Generic drug:  fluticasone Place 2 sprays into the nose daily. apply 2 sprays to both nostrils daily prn nasal congestion/ nose related allergy symptoms   furosemide 20 MG tablet Commonly known as:  LASIX Take 20 mg by mouth daily.   gabapentin 300 MG capsule Commonly known as:  NEURONTIN Take 600 mg by mouth 3 (three) times daily.   pantoprazole 40 MG tablet Commonly known as:  PROTONIX Take 40 mg by mouth daily.   potassium chloride 10 MEQ tablet Commonly known as:  K-DUR,KLOR-CON Take 10 mEq by mouth daily.       No orders of the defined types were placed in this encounter.   Immunization History  Administered Date(s) Administered  . Influenza-Unspecified 09/07/2016    Social History   Tobacco Use  . Smoking status: Former Smoker    Years: 55.00    Last attempt to quit: 04/2016    Years since quitting: 1.5  . Smokeless tobacco: Never Used  Substance Use Topics  . Alcohol use: No    Frequency: Never    Review of Systems unable to obtain secondary to dementia; nursing-no acute concerns    Vitals:   10/09/17 1411  BP: 124/78  Pulse: 96  Resp: 19  Temp: 98 F (36.7 C)  SpO2: 97%   Body mass index is 41.25 kg/m. Physical Exam  GENERAL APPEARANCE: Alert, moderately conversant, No acute distress  SKIN: No diaphoresis rash HEENT: Unremarkable RESPIRATORY: Breathing is even, unlabored. Lung sounds are clear   CARDIOVASCULAR: Heart RRR no murmurs, rubs or gallops. No peripheral edema  GASTROINTESTINAL: Abdomen is soft, non-tender, not distended w/ normal bowel sounds.  GENITOURINARY: Bladder non tender, not distended  MUSCULOSKELETAL: No abnormal joints or musculature NEUROLOGIC: Cranial nerves 2-12 grossly intact. Moves all extremities PSYCHIATRIC: Dementia, no behavioral issues  Patient Active Problem List   Diagnosis Date Noted  . Declining functional status  08/18/2017  . Decreased functional mobility 08/18/2017  . Chronic kidney disease (CKD) 06/02/2017  . Arthritis 06/02/2017  . GI bleed 02/15/2017  . Acute blood loss anemia 02/15/2017  . Escherichia coli urinary tract infection 02/15/2017  . Hypoxia 02/15/2017  . Edema of upper extremity 02/15/2017  . Bilateral lower extremity edema 02/15/2017  . Pulmonary embolism (HCC) 02/08/2017  . Hypertension 02/08/2017  . Acute kidney injury superimposed on chronic kidney disease (HCC) 02/08/2017  . Polyneuropathy 02/08/2017  . Depression 02/08/2017    CMP     Component Value Date/Time   NA 145 09/22/2017   K 4.5 09/22/2017   BUN 29 (A) 09/22/2017   CREATININE 0.9 09/22/2017   AST 21 01/27/2017   ALT 13 01/27/2017   ALKPHOS 66 01/27/2017   Recent Labs    08/08/17 08/18/17 09/22/17  NA 148* 145 145  K 4.4 4.5 4.5  BUN 27* 22* 29*  CREATININE 1.1 0.9 0.9   Recent Labs    01/27/17  AST 21  ALT 13  ALKPHOS 66   Recent Labs    04/14/17 06/23/17  08/25/17 09/23/17 10/23/17  WBC 6.1 5   < > 7.1 6.7 7.1  NEUTROABS 4  --   --   --   --   --   HGB 9.0* 8.7   < > 9.2* 9.4* 9.3*  HCT 29* 28.9   < > 31* 31* 31*  MCV  --  78.6  --   --   --   --   PLT 237  --    < > 221 243 232   < > = values in this interval not displayed.   No results for input(s): CHOL, LDLCALC, TRIG in the last 8760 hours.  Invalid input(s): HCL No results found for: MICROALBUR No results found for: TSH No results found for: HGBA1C No results found for: CHOL, HDL, LDLCALC, LDLDIRECT, TRIG, CHOLHDL  Significant Diagnostic Results in last 30 days:  No results found.  Assessment and Plan  Hypertension Controlled; continue Lasix 20 mg daily  Depression Patient is doing well on no medications; continue supportive care  Arthritis Patient does not appear uncomfortable; plan to continue Tylenol 500 mg twice daily     Merrilee Seashore, MD

## 2017-11-05 NOTE — Assessment & Plan Note (Signed)
Controlled; continue Lasix 20 mg daily 

## 2017-11-05 NOTE — Assessment & Plan Note (Signed)
Patient does not appear uncomfortable; plan to continue Tylenol 500 mg twice daily

## 2017-11-05 NOTE — Assessment & Plan Note (Addendum)
Patient is doing well on no medications; continue supportive care

## 2017-11-12 ENCOUNTER — Non-Acute Institutional Stay (SKILLED_NURSING_FACILITY): Payer: Medicare Other | Admitting: Internal Medicine

## 2017-11-12 ENCOUNTER — Encounter: Payer: Self-pay | Admitting: Internal Medicine

## 2017-11-12 DIAGNOSIS — I2699 Other pulmonary embolism without acute cor pulmonale: Secondary | ICD-10-CM | POA: Diagnosis not present

## 2017-11-12 DIAGNOSIS — R600 Localized edema: Secondary | ICD-10-CM

## 2017-11-12 DIAGNOSIS — G629 Polyneuropathy, unspecified: Secondary | ICD-10-CM

## 2017-11-12 NOTE — Progress Notes (Signed)
Nursing Home Room Number: 216W Place of Service:  SNF (31)  Randon Goldsmith. [name], MD  Patient Care Team: [name], MD as PCP - General (Internal Medicine)  Extended Emergency Contact Information Primary Emergency Contact: Laney Potash Address: [address]          [place], [place] [address] D[place] of [place] Home Phone: [phone] Relation: Son Preferred language: English Interpreter needed? No Secondary Emergency Contact: Marcille Buffy Home Phone: [phone] Relation: Sister    Allergies: Patient has no known allergies.  Chief Complaint  Patient presents with  . Medical Management of Chronic Issues    Routine Visit  . Health Maintenance    Influenza vacc.    HPI: Patient is 72 y.o. female who is being seen for routine issues of prior pulmonary embolism, bilateral lower extremity edema and peripheral neuropathy.  Past Medical History:  Diagnosis Date  . Arthritis   . Depression   . Hypertension   . Pulmonary embolism [facility])     Past Surgical History:  Procedure Laterality Date  . ABDOMINAL HYSTERECTOMY    . BACK SURGERY    . REPLACEMENT TOTAL KNEE BILATERAL      Allergies as of 11/12/2017   No Known Allergies     Medication List        Accurate as of 11/12/17 11:59 PM. Always use your most recent med list.          acetaminophen 325 MG tablet Commonly known as:  TYLENOL Take 650 mg by mouth every 4 (four) hours as needed for mild pain or fever.   acetaminophen 500 MG tablet Commonly known as:  TYLENOL Take 500 mg by mouth 2 (two) times daily.   albuterol (2.5 MG/3ML) 0.083% nebulizer solution Commonly known as:  PROVENTIL Take 2.5 mg by nebulization every 4 (four) hours as needed for wheezing.   calcium carbonate 500 MG chewable tablet Commonly known as:  TUMS - dosed in mg elemental calcium Chew 2 tablets by mouth 3 (three) times daily as needed for indigestion or heartburn.   CLARITIN 10 MG tablet Generic drug:   loratadine Take 10 mg by mouth daily.   FLONASE SENSIMIST 27.5 MCG/SPRAY nasal spray Generic drug:  fluticasone Place 2 sprays into the nose daily. apply 2 sprays to both nostrils daily prn nasal congestion/ nose related allergy symptoms   fluconazole 200 MG tablet Commonly known as:  DIFLUCAN Take 200 mg by mouth daily.   furosemide 20 MG tablet Commonly known as:  LASIX Take 20 mg by mouth daily.   gabapentin 300 MG capsule Commonly known as:  NEURONTIN Take 600 mg by mouth 3 (three) times daily.   NAFTIN 1 % Gel Generic drug:  Naftifine HCl Apply topically.   pantoprazole 40 MG tablet Commonly known as:  PROTONIX Take 40 mg by mouth daily.   polyethylene glycol packet Commonly known as:  MIRALAX / GLYCOLAX Take 17 g by mouth daily.   potassium chloride 10 MEQ tablet Commonly known as:  K-DUR,KLOR-CON Take 10 mEq by mouth daily.       No orders of the defined types were placed in this encounter.   Immunization History  Administered Date(s) Administered  . Influenza-Unspecified 09/07/2016    Social History   Tobacco Use  . Smoking status: Former Smoker    Years: 55.00    Last attempt to quit: 04/2016    Years since quitting: 1.6  . Smokeless tobacco: Never Used  Substance Use Topics  . Alcohol  use: No    Frequency: Never    Review of Systems  DATA OBTAINED: from patient, nurse GENERAL:  no fevers, fatigue, appetite changes SKIN: No itching, rash HEENT: No complaint RESPIRATORY: No cough, wheezing, SOB CARDIAC: No chest pain, palpitations, lower extremity edema  GI: No abdominal pain, No N/V/D or constipation, No heartburn or reflux  GU: No dysuria, frequency or urgency, or incontinence  MUSCULOSKELETAL: No unrelieved bone/joint pain NEUROLOGIC: No headache, dizziness  PSYCHIATRIC: No overt anxiety or sadness  Vitals:   11/12/17 1513  BP: (!) 151/84  Pulse: 89  Resp: 18  Temp: 97.9 F (36.6 C)   Body mass index is 41.01 kg/m. Physical  Exam  GENERAL APPEARANCE: Alert, conversant, No acute distress  SKIN: No diaphoresis rash HEENT: Unremarkable RESPIRATORY: Breathing is even, unlabored. Lung sounds are clear   CARDIOVASCULAR: Heart RRR no murmurs, rubs or gallops. + peripheral edema  GASTROINTESTINAL: Abdomen is soft, non-tender, not distended w/ normal bowel sounds.  GENITOURINARY: Bladder non tender, not distended  MUSCULOSKELETAL: No abnormal joints or musculature NEUROLOGIC: Cranial nerves 2-12 grossly intact. Moves all extremities PSYCHIATRIC: Mood and affect appropriate to situation, no behavioral issues  Patient Active Problem List   Diagnosis Date Noted  . Declining functional status 08/18/2017  . Decreased functional mobility 08/18/2017  . Chronic kidney disease (CKD) 06/02/2017  . Arthritis 06/02/2017  . GI bleed 02/15/2017  . Acute blood loss anemia 02/15/2017  . Escherichia coli urinary tract infection 02/15/2017  . Hypoxia 02/15/2017  . Edema of upper extremity 02/15/2017  . Bilateral lower extremity edema 02/15/2017  . Pulmonary embolism (HCC) 02/08/2017  . Hypertension 02/08/2017  . Acute kidney injury superimposed on chronic kidney disease (HCC) 02/08/2017  . Polyneuropathy 02/08/2017  . Depression 02/08/2017    CMP     Component Value Date/Time   NA 145 09/22/2017   K 4.5 09/22/2017   BUN 29 (A) 09/22/2017   CREATININE 0.9 09/22/2017   AST 21 01/27/2017   ALT 13 01/27/2017   ALKPHOS 66 01/27/2017   Recent Labs    08/08/17 08/18/17 09/22/17  NA 148* 145 145  K 4.4 4.5 4.5  BUN 27* 22* 29*  CREATININE 1.1 0.9 0.9   Recent Labs    01/27/17  AST 21  ALT 13  ALKPHOS 66   Recent Labs    04/14/17 06/23/17  08/25/17 09/23/17 10/23/17  WBC 6.1 5   < > 7.1 6.7 7.1  NEUTROABS 4  --   --   --   --   --   HGB 9.0* 8.7   < > 9.2* 9.4* 9.3*  HCT 29* 28.9   < > 31* 31* 31*  MCV  --  78.6  --   --   --   --   PLT 237  --    < > 221 243 232   < > = values in this interval not displayed.    No results for input(s): CHOL, LDLCALC, TRIG in the last 8760 hours.  Invalid input(s): HCL No results found for: MICROALBUR No results found for: TSH No results found for: HGBA1C No results found for: CHOL, HDL, LDLCALC, LDLDIRECT, TRIG, CHOLHDL  Significant Diagnostic Results in last 30 days:  No results found.  Assessment and Plan  Pulmonary embolism (HCC) Treated until May 2019 with Eliquis; monitor  Bilateral lower extremity edema Mild and baseline; continue Lasix 20 mg daily  Polyneuropathy Patient on Neurontin 1200 3 times daily which initially patient said was working  well, then patient said still having problems; Tylenol was added patient now appears content; will monitor    Thurston Hole D. [name], MD

## 2017-11-15 ENCOUNTER — Encounter: Payer: Self-pay | Admitting: Internal Medicine

## 2017-11-15 NOTE — Assessment & Plan Note (Signed)
Mild and baseline; continue Lasix 20 mg daily

## 2017-11-15 NOTE — Assessment & Plan Note (Signed)
Patient on Neurontin 1200 3 times daily which initially patient said was working well, then patient said still having problems; Tylenol was added patient now appears content; will monitor

## 2017-11-15 NOTE — Assessment & Plan Note (Signed)
Treated until May 2019 with Eliquis; monitor

## 2017-11-21 ENCOUNTER — Non-Acute Institutional Stay (SKILLED_NURSING_FACILITY): Payer: Medicare Other

## 2017-11-21 DIAGNOSIS — Z0000 Encounter for general adult medical examination without abnormal findings: Secondary | ICD-10-CM

## 2017-11-21 NOTE — Progress Notes (Signed)
Subjective:   Kristine Todd is a 72 y.o. female who presents for Medicare Annual (Subsequent) preventive examination at [facility]    Objective:     Vitals: BP 130/82 (BP Location: Left Arm, Patient Position: Sitting)   Pulse 80   Temp 97.9 F (36.6 C) (Oral)   Ht 5' (1.524 m)   Wt 210 lb (95.3 kg)   BMI 41.01 kg/m   Body mass index is 41.01 kg/m.  Advanced Directives 11/21/2017 06/12/2017 05/21/2017 05/20/2017 04/29/2017 02/03/2017  Does Patient Have a Medical Advance Directive? No No No No No No  Would patient like information on creating a medical advance directive? No - Patient declined - - - - -    Tobacco Social History   Tobacco Use  Smoking Status Former Smoker  . Years: 55.00  . Last attempt to quit: 04/2016  . Years since quitting: 1.6  Smokeless Tobacco Never Used     Counseling given: Not Answered   Clinical Intake:  Pre-visit preparation completed: No  Pain : 0-10 Pain Score: 8  Pain Type: Chronic pain Pain Location: Leg Pain Orientation: Left, Right Pain Descriptors / Indicators: Aching Pain Onset: More than a month ago Pain Frequency: Intermittent     Diabetes: No  How often do you need to have someone help you when you read instructions, pamphlets, or other written materials from your doctor or pharmacy?: 2 - Rarely What is the last grade level you completed in school?: high school  Interpreter Needed?: No  Information entered by :: Tyron Russell, RN  Past Medical History:  Diagnosis Date  . Arthritis   . Depression   . Hypertension   . Pulmonary embolism [facility])    Past Surgical History:  Procedure Laterality Date  . ABDOMINAL HYSTERECTOMY    . BACK SURGERY    . REPLACEMENT TOTAL KNEE BILATERAL     Family History  Problem Relation Age of Onset  . Cancer Mother   . Hypertension Mother   . Diabetes Mother   . Hypertension Father   . Cancer Sister   . Diabetes Sister   . Diabetes Maternal Grandmother   .  Hypertension Maternal Grandmother   . Diabetes Maternal Grandfather   . Hypertension Maternal Grandfather    Social History   Socioeconomic History  . Marital status: Widowed    Spouse name: Not on file  . Number of children: Not on file  . Years of education: Not on file  . Highest education level: Not on file  Occupational History  . Not on file  Social Needs  . Financial resource strain: Not hard at all  . Food insecurity:    Worry: Never true    Inability: Never true  . Transportation needs:    Medical: No    Non-medical: No  Tobacco Use  . Smoking status: Former Smoker    Years: 55.00    Last attempt to quit: 04/2016    Years since quitting: 1.6  . Smokeless tobacco: Never Used  Substance and Sexual Activity  . Alcohol use: No    Frequency: Never  . Drug use: No  . Sexual activity: Never  Lifestyle  . Physical activity:    Days per week: 0 days    Minutes per session: 0 min  . Stress: Not at all  Relationships  . Social connections:    Talks on phone: More than three times a week    Gets together: More than three times  a week    Attends religious service: Never    Active member of club or organization: No    Attends meetings of clubs or organizations: Never    Relationship status: Widowed  Other Topics Concern  . Not on file  Social History Narrative  . Not on file    Outpatient Encounter Medications as of 11/21/2017  Medication Sig  . acetaminophen (TYLENOL) 325 MG tablet Take 650 mg by mouth every 4 (four) hours as needed for mild pain or fever.  Marland Kitchen acetaminophen (TYLENOL) 500 MG tablet Take 500 mg by mouth 2 (two) times daily.  Marland Kitchen albuterol (PROVENTIL) (2.5 MG/3ML) 0.083% nebulizer solution Take 2.5 mg by nebulization every 4 (four) hours as needed for wheezing.  . calcium carbonate (TUMS - DOSED IN MG ELEMENTAL CALCIUM) 500 MG chewable tablet Chew 2 tablets by mouth 3 (three) times daily as needed for indigestion or heartburn.  . fluconazole (DIFLUCAN)  200 MG tablet Take 200 mg by mouth daily.  . fluticasone (FLONASE SENSIMIST) 27.5 MCG/SPRAY nasal spray Place 2 sprays into the nose daily. apply 2 sprays to both nostrils daily prn nasal congestion/ nose related allergy symptoms  . furosemide (LASIX) 20 MG tablet Take 20 mg by mouth daily.  Marland Kitchen gabapentin (NEURONTIN) 300 MG capsule Take 600 mg by mouth 3 (three) times daily.   Marland Kitchen loratadine (CLARITIN) 10 MG tablet Take 10 mg by mouth daily.  . Naftifine HCl (NAFTIN) 1 % GEL Apply topically.  . pantoprazole (PROTONIX) 40 MG tablet Take 40 mg by mouth daily.  . polyethylene glycol (MIRALAX / GLYCOLAX) packet Take 17 g by mouth daily.  . potassium chloride (K-DUR,KLOR-CON) 10 MEQ tablet Take 10 mEq by mouth daily.   No facility-administered encounter medications on file as of 11/21/2017.     Activities of Daily Living In your present state of health, do you have any difficulty performing the following activities: 11/21/2017  Hearing? N  Vision? N  Difficulty concentrating or making decisions? N  Walking or climbing stairs? Y  Dressing or bathing? Y  Doing errands, shopping? Y  Preparing Food and eating ? Y  Using the Toilet? Y  In the past six months, have you accidently leaked urine? Y  Do you have problems with loss of bowel control? Y  Managing your Medications? Y  Managing your Finances? Y  Housekeeping or managing your Housekeeping? Y    Patient Care Team: [name], MD as PCP - General (Internal Medicine)    Assessment:   This is a routine wellness examination for Kristine Todd.  Exercise Activities and Dietary recommendations Current Exercise Habits: The patient does not participate in regular exercise at present, Exercise limited by: None identified  Goals   None     Fall Risk Fall Risk  11/21/2017  Falls in the past year? 0  Number falls in past yr: 0  Injury with Fall? 0   Is the patient's home free of loose throw rugs in walkways, pet beds, electrical cords,  etc?   yes      Grab bars in the bathroom? yes      Handrails on the stairs?   yes      Adequate lighting?   yes  Depression Screen PHQ 2/9 Scores 11/21/2017  PHQ - 2 Score 0     Cognitive Function     6CIT Screen 11/21/2017  What Year? 0 points  What month? 0 points  What time? 0 points  Count back from 20 0  points  Months in reverse 0 points  Repeat phrase 2 points  Total Score 2    Immunization History  Administered Date(s) Administered  . Influenza-Unspecified 09/07/2016, 09/25/2017    Qualifies for Shingles Vaccine? Not in past records  Screening Tests Health Maintenance  Topic Date Due  . DEXA SCAN  12/12/2017 (Originally 07/06/2001)  . PNA vac Low Risk Adult (1 of 2 - PCV13) 12/12/2017 (Originally 07/06/2001)  . TETANUS/TDAP  09/12/2019 (Originally 07/07/1954)  . INFLUENZA VACCINE  Completed    Cancer Screenings: Lung: Low Dose CT Chest recommended if Age 36-80 years, 30 pack-year currently smoking OR have quit w/in 15years. Patient does not qualify. Breast:  Up to date on Mammogram? Yes   Up to date of Bone Density/Dexa? Yes Colorectal: up to date  Additional Screenings:  Hepatitis C Screening: declined     Plan:  I have personally reviewed and addressed the Medicare Annual Wellness questionnaire and have noted the following in the patient's chart:  A. Medical and social history B. Use of alcohol, tobacco or illicit drugs  C. Current medications and supplements D. Functional ability and status E.  Nutritional status F.  Physical activity G. Advance directives H. List of other physicians I.  Hospitalizations, surgeries, and ER visits in previous 12 months J.  Vitals K. Screenings to include hearing, vision, cognitive, depression L. Referrals and appointments - none  In addition, I have reviewed and discussed with patient certain preventive protocols, quality metrics, and best practice recommendations. A written personalized care plan for preventive  services as well as general preventive health recommendations were provided to patient.  See attached scanned questionnaire for additional information.   Signed,   Tyron RussellSara Kamaron Deskins, RN Nurse Health Advisor  Patient Concerns: None

## 2017-11-21 NOTE — Patient Instructions (Signed)
Ms. Kristine Todd , I have completed the annual wellness visit as per Medicare guidelines. The attached information is provided for the patient's family, care providers, and facility of residence.  Screening recommendations/referrals: Colonoscopy excluded, over age 71 Mammogram excluded, over age 54 Bone Density due, ordered Recommended yearly ophthalmology/optometry visit for glaucoma screening and checkup Recommended yearly dental visit for hygiene and checkup  Vaccinations: Influenza vaccine up to date Pneumococcal vaccine 13 due, ordered Tdap vaccine due, ordered Shingles vaccine not in past records    Advanced directives: in chart  Conditions/risks identified: Fall Risk  Next appointment: Dr. [name] makes rounds   Preventive Care 65 Years and Older, Female Preventive care refers to lifestyle choices and visits with your health care provider that can promote health and wellness. What does preventive care include?  A yearly physical exam. This is also called an annual well check.  Dental exams once or twice a year.  Routine eye exams. Ask your health care provider how often you should have your eyes checked.  Personal lifestyle choices, including:  Daily care of your teeth and gums.  Regular physical activity.  Eating a healthy diet.  Avoiding tobacco and drug use.  Limiting alcohol use.  Taking vitamin and mineral supplements as recommended by your health care provider. What happens during an annual well check? The services and screenings done by your health care provider during your annual well check will depend on your age, overall health, lifestyle risk factors, and family history of disease. Counseling  Your health care provider may ask you questions about your:  Alcohol use.  Tobacco use.  Drug use.  Emotional well-being.  Home and relationship well-being.  Eating habits.  History of falls.  Memory and ability to understand (cognition).  Work  and work Astronomer.  Reproductive health. Screening  You may have the following tests or measurements:  Height, weight, and BMI.  Blood pressure.  Lipid and cholesterol levels. These may be checked every 5 years, or more frequently if you are over 34 years old.  Skin check.  Lung cancer screening. You may have this screening every year starting at age 37 if you have a 30-pack-year history of smoking and currently smoke or have quit within the past 15 years.  Fecal occult blood test (FOBT) of the stool. You may have this test every year starting at age 66.  Flexible sigmoidoscopy or colonoscopy. You may have a sigmoidoscopy every 5 years or a colonoscopy every 10 years starting at age 7.  Hepatitis C blood test.  Hepatitis B blood test.  Diabetes screening. This is done by checking your blood sugar (glucose) after you have not eaten for a while (fasting). You may have this done every 1-3 years.  Bone density scan. This is done to screen for osteoporosis. You may have this done starting at age 69.  Mammogram. This may be done every 1-2 years. Talk to your health care provider about how often you should have regular mammograms. Talk with your health care provider about your test results, treatment options, and if necessary, the need for more tests. Vaccines  Your health care provider may recommend certain vaccines, such as:  Influenza vaccine. This is recommended every year.  Tetanus, diphtheria, and acellular pertussis (Tdap, Td) vaccine. You may need a Td booster every 10 years.  Zoster vaccine. You may need this after age 23.  Pneumococcal 13-valent conjugate (PCV13) vaccine. One dose is recommended after age 76.  Pneumococcal polysaccharide (PPSV23) vaccine. One dose is  recommended after age 765. Talk to your health care provider about which screenings and vaccines you need and how often you need them. This information is not intended to replace advice given to you by  your health care provider. Make sure you discuss any questions you have with your health care provider. Document Released: 01/20/2015 Document Revised: 09/13/2015 Document Reviewed: 10/25/2014 Elsevier Interactive Patient Education  2017 [employer]   Fall Prevention Falls can cause injuries. They can happen to people of all ages. There are many things you can do to make your home safe and to help prevent falls.  What can I do in the bathroom?  Use night lights.  Install grab bars by the toilet and in the tub and shower. Do not use towel bars as grab bars.  Use non-skid mats or decals in the tub or shower.  If you need to sit down in the shower, use a plastic, non-slip stool.  Keep the floor dry. Clean up any water that spills on the floor as soon as it happens.  Remove soap buildup in the tub or shower regularly.  Attach bath mats securely with double-sided non-slip rug tape.  Do not have throw rugs and other things on the floor that can make you trip. What can I do in the bedroom?  Use night lights.  Make sure that you have a light by your bed that is easy to reach.  Do not use any sheets or blankets that are too big for your bed. They should not hang down onto the floor.  Have a firm chair that has side arms. You can use this for support while you get dressed.  Do not have throw rugs and other things on the floor that can make you trip. What else can I do to help prevent falls?  Wear shoes that:  Do not have high heels.  Have rubber bottoms.  Are comfortable and fit you well.  Are closed at the toe. Do not wear sandals.  If you use a stepladder:  Make sure that it is fully opened. Do not climb a closed stepladder.  Make sure that both sides of the stepladder are locked into place.  Ask someone to hold it for you, if possible.  Clearly mark and make sure that you can see:  Any grab bars or handrails.  First and last steps.  Where the edge of each  step is.  Use tools that help you move around (mobility aids) if they are needed. These include:  Canes.  Walkers.  Scooters.  Crutches.  Turn on the lights when you go into a dark area. Replace any light bulbs as soon as they burn out.  Set up your furniture so you have a clear path. Avoid moving your furniture around.  If any of your floors are uneven, fix them.  Review your medicines with your doctor. Some medicines can make you feel dizzy. This can increase your chance of falling. Ask your doctor what other things that you can do to help prevent falls. This information is not intended to replace advice given to you by your health care provider. Make sure you discuss any questions you have with your health care provider. Document Released: 10/20/2008 Document Revised: 06/01/2015 Document Reviewed: 01/28/2014 Elsevier Interactive Patient Education  2017 [employer]

## 2017-11-24 LAB — CBC AND DIFFERENTIAL
HEMATOCRIT: 35 — AB (ref 36–46)
Hemoglobin: 10.8 — AB (ref 12.0–16.0)
PLATELETS: 184 (ref 150–399)
WBC: 5.2

## 2017-12-11 ENCOUNTER — Non-Acute Institutional Stay (SKILLED_NURSING_FACILITY): Payer: Medicare Other | Admitting: Internal Medicine

## 2017-12-11 ENCOUNTER — Encounter: Payer: Self-pay | Admitting: Internal Medicine

## 2017-12-11 DIAGNOSIS — J3089 Other allergic rhinitis: Secondary | ICD-10-CM | POA: Diagnosis not present

## 2017-12-11 DIAGNOSIS — K219 Gastro-esophageal reflux disease without esophagitis: Secondary | ICD-10-CM | POA: Diagnosis not present

## 2017-12-11 DIAGNOSIS — N183 Chronic kidney disease, stage 3 unspecified: Secondary | ICD-10-CM

## 2017-12-11 NOTE — Progress Notes (Signed)
Location:  [facility] Room Number: [phone] Place of Service:  SNF [phone])  Randon Goldsmith. [name], MD  Patient Care Team: [name], MD as PCP - General (Internal Medicine)  Extended Emergency Contact Information Primary Emergency Contact: Laney Potash Address: [address]          [place], [place] [address] [place] of [place] Home Phone: [phone] Relation: Son Preferred language: English Interpreter needed? No Secondary Emergency Contact: Marcille Buffy Home Phone: [phone] Relation: Sister    Allergies: Patient has no known allergies.  Chief Complaint  Patient presents with  . Medical Management of Chronic Issues    Routine Visit    HPI: Patient is 72 y.o. female who is being seen for routine issues of chronic kidney disease stage III, GERD, and allergic rhinitis.  Past Medical History:  Diagnosis Date  . Arthritis   . Depression   . Hypertension   . Pulmonary embolism [facility])     Past Surgical History:  Procedure Laterality Date  . ABDOMINAL HYSTERECTOMY    . BACK SURGERY    . REPLACEMENT TOTAL KNEE BILATERAL      Allergies as of 12/11/2017   No Known Allergies     Medication List       Accurate as of December 11, 2017 11:59 PM. Always use your most recent med list.        acetaminophen 325 MG tablet Commonly known as:  TYLENOL Take 650 mg by mouth every 4 (four) hours as needed for mild pain or fever.   acetaminophen 500 MG tablet Commonly known as:  TYLENOL Take 500 mg by mouth 2 (two) times daily.   albuterol (2.5 MG/3ML) 0.083% nebulizer solution Commonly known as:  PROVENTIL Take 2.5 mg by nebulization every 4 (four) hours as needed for wheezing.   calcium carbonate 500 MG chewable tablet Commonly known as:  TUMS - dosed in mg elemental calcium Chew 2 tablets by mouth 3 (three) times daily as needed for indigestion or heartburn.   CLARITIN 10 MG tablet Generic drug:  loratadine Take 10  mg by mouth daily.   FLONASE SENSIMIST 27.5 MCG/SPRAY nasal spray Generic drug:  fluticasone Place 2 sprays into the nose daily. apply 2 sprays to both nostrils daily prn nasal congestion/ nose related allergy symptoms   furosemide 20 MG tablet Commonly known as:  LASIX Take 20 mg by mouth daily.   gabapentin 300 MG capsule Commonly known as:  NEURONTIN Take 600 mg by mouth 3 (three) times daily.   pantoprazole 40 MG tablet Commonly known as:  PROTONIX Take 40 mg by mouth daily.   polyethylene glycol packet Commonly known as:  MIRALAX / GLYCOLAX Take 17 g by mouth daily.   potassium chloride 10 MEQ tablet Commonly known as:  K-DUR,KLOR-CON Take 10 mEq by mouth daily.       No orders of the defined types were placed in this encounter.   Immunization History  Administered Date(s) Administered  . Influenza-Unspecified 09/07/2016, 09/25/2017    Social History   Tobacco Use  . Smoking status: Former Smoker    Years: 55.00    Last attempt to quit: 04/2016    Years since quitting: 1.7  . Smokeless tobacco: Never Used  Substance Use Topics  . Alcohol use: No    Frequency: Never    Review of Systems  DATA OBTAINED: from patient, nurse GENERAL:  no fevers, fatigue, appetite changes SKIN: No itching, rash HEENT: No  complaint RESPIRATORY: No cough, wheezing, SOB CARDIAC: No chest pain, palpitations, lower extremity edema  GI: No abdominal pain, No N/V/D or constipation, No heartburn or reflux  GU: No dysuria, frequency or urgency, or incontinence  MUSCULOSKELETAL: No unrelieved bone/joint pain NEUROLOGIC: No headache, dizziness  PSYCHIATRIC: No overt anxiety or sadness  Vitals:   12/11/17 1257  BP: (!) 143/81  Pulse: 94  Resp: 20  Temp: (!) 97.4 F (36.3 C)   Body mass index is 41.01 kg/m. Physical Exam  GENERAL APPEARANCE: Alert, conversant, No acute distress; patient in hall daily in her wheelchair SKIN: No diaphoresis rash HEENT:  Unremarkable RESPIRATORY: Breathing is even, unlabored.  CARDIOVASCULAR:  No peripheral edema  GASTROINTESTINAL: Abdomen is not distended   GENITOURINARY: Bladder not distended  MUSCULOSKELETAL: No abnormal joints or musculature NEUROLOGIC: Cranial nerves 2-12 grossly intact. Moves all extremities PSYCHIATRIC: Mood and affect appropriate to situation, no behavioral issues  Patient Active Problem List   Diagnosis Date Noted  . GERD (gastroesophageal reflux disease) 12/21/2017  . Allergic rhinitis 12/21/2017  . Declining functional status 08/18/2017  . Decreased functional mobility 08/18/2017  . Chronic kidney disease (CKD) 06/02/2017  . Arthritis 06/02/2017  . GI bleed 02/15/2017  . Acute blood loss anemia 02/15/2017  . Escherichia coli urinary tract infection 02/15/2017  . Hypoxia 02/15/2017  . Edema of upper extremity 02/15/2017  . Bilateral lower extremity edema 02/15/2017  . Pulmonary embolism (HCC) 02/08/2017  . Hypertension 02/08/2017  . Acute kidney injury superimposed on chronic kidney disease (HCC) 02/08/2017  . Polyneuropathy 02/08/2017  . Depression 02/08/2017    CMP     Component Value Date/Time   NA 145 09/22/2017   K 4.5 09/22/2017   BUN 29 (A) 09/22/2017   CREATININE 0.9 09/22/2017   AST 21 01/27/2017   ALT 13 01/27/2017   ALKPHOS 66 01/27/2017   Recent Labs    08/08/17 08/18/17 09/22/17  NA 148* 145 145  K 4.4 4.5 4.5  BUN 27* 22* 29*  CREATININE 1.1 0.9 0.9   Recent Labs    01/27/17  AST 21  ALT 13  ALKPHOS 66   Recent Labs    04/14/17 06/23/17  09/23/17 10/23/17 11/24/17  WBC 6.1 5   < > 6.7 7.1 5.2  NEUTROABS 4  --   --   --   --   --   HGB 9.0* 8.7   < > 9.4* 9.3* 10.8*  HCT 29* 28.9   < > 31* 31* 35*  MCV  --  78.6  --   --   --   --   PLT 237  --    < > 243 232 184   < > = values in this interval not displayed.   No results for input(s): CHOL, LDLCALC, TRIG in the last 8760 hours.  Invalid input(s): HCL No results found for:  MICROALBUR No results found for: TSH No results found for: HGBA1C No results found for: CHOL, HDL, LDLCALC, LDLDIRECT, TRIG, CHOLHDL  Significant Diagnostic Results in last 30 days:  No results found.  Assessment and Plan  Chronic kidney disease (CKD) Most recent creatinine 0.9 which is stable from prior no recent GFR; no recent problems; will monitor intervals  GERD (gastroesophageal reflux disease) No reported problems; continue Protonix 40 mg daily  Allergic rhinitis No complaints or known problems; continue Flonase 2 sprays each nostril daily     Jasaiah Karwowski D. Lyn HollingsheadAlexander, MD

## 2017-12-21 ENCOUNTER — Encounter: Payer: Self-pay | Admitting: Internal Medicine

## 2017-12-21 DIAGNOSIS — K219 Gastro-esophageal reflux disease without esophagitis: Secondary | ICD-10-CM | POA: Insufficient documentation

## 2017-12-21 DIAGNOSIS — J309 Allergic rhinitis, unspecified: Secondary | ICD-10-CM | POA: Insufficient documentation

## 2017-12-21 NOTE — Assessment & Plan Note (Signed)
No reported problems; continue Protonix 40 mg daily 

## 2017-12-21 NOTE — Assessment & Plan Note (Signed)
No complaints or known problems; continue Flonase 2 sprays each nostril daily

## 2017-12-21 NOTE — Assessment & Plan Note (Signed)
Most recent creatinine 0.9 which is stable from prior no recent GFR; no recent problems; will monitor intervals

## 2017-12-24 LAB — CBC AND DIFFERENTIAL
HEMATOCRIT: 35 — AB (ref 36–46)
HEMOGLOBIN: 11.4 — AB (ref 12.0–16.0)
PLATELETS: 162 (ref 150–399)
WBC: 5.8

## 2018-01-05 LAB — HM DIABETES FOOT EXAM

## 2018-01-13 ENCOUNTER — Encounter: Payer: Self-pay | Admitting: Internal Medicine

## 2018-01-13 ENCOUNTER — Non-Acute Institutional Stay (SKILLED_NURSING_FACILITY): Payer: Medicare Other | Admitting: Internal Medicine

## 2018-01-13 DIAGNOSIS — M1990 Unspecified osteoarthritis, unspecified site: Secondary | ICD-10-CM

## 2018-01-13 DIAGNOSIS — F329 Major depressive disorder, single episode, unspecified: Secondary | ICD-10-CM | POA: Diagnosis not present

## 2018-01-13 DIAGNOSIS — I10 Essential (primary) hypertension: Secondary | ICD-10-CM

## 2018-01-13 DIAGNOSIS — F32A Depression, unspecified: Secondary | ICD-10-CM

## 2018-01-13 NOTE — Progress Notes (Signed)
:    [facility] Room Number: [phone] Place of Service:  SNF (31)  Kristine Goldsmithnne Todd. Lyn HollingsheadAlexander, MD  Patient Care Team: Kristine Todd, Kristine Arps D, MD as PCP - General (Internal Medicine)  Extended Emergency Contact Information Primary Emergency Contact: Laney PotashHarris, Kristine Address: [address]          [place], [place] [address] [place] Home Phone: [phone] Relation: Son Preferred language: English Interpreter needed? No Secondary Emergency Contact: Kristine Todd, Kristine Home Phone: [phone] Relation: Sister     Allergies: Patient has no known allergies.  Chief Complaint  Patient presents with  . Medical Management of Chronic Issues    Routine Visit    HPI: Patient is 73 y.o. female who is being seen for routine issues of depression, osteoarthritis, and hypertension.  Past Medical History:  Diagnosis Date  . Arthritis   . Depression   . Hypertension   . Pulmonary embolism [facility](HCC)     Past Surgical History:  Procedure Laterality Date  . ABDOMINAL HYSTERECTOMY    . BACK SURGERY    . REPLACEMENT TOTAL KNEE BILATERAL      Allergies as of 01/13/2018   No Known Allergies     Medication List       Accurate as of January 13, 2018 11:59 PM. Always use your most recent med list.        acetaminophen 325 MG tablet Commonly known as:  TYLENOL Take 650 mg by mouth every 4 (four) hours as needed for mild pain or fever.   acetaminophen 500 MG tablet Commonly known as:  TYLENOL Take 500 mg by mouth 2 (two) times daily.   albuterol (2.5 MG/3ML) 0.083% nebulizer solution Commonly known as:  PROVENTIL Take 2.5 mg by nebulization every 4 (four) hours as needed for wheezing.   calcium carbonate 500 MG chewable tablet Commonly known as:  TUMS - dosed in mg elemental calcium Chew 2 tablets by mouth 3 (three) times daily as needed for indigestion or heartburn.   CLARITIN 10 MG tablet Generic drug:  loratadine Take 10 mg by mouth  daily.   FLONASE SENSIMIST 27.5 MCG/SPRAY nasal spray Generic drug:  fluticasone Place 2 sprays into the nose daily. apply 2 sprays to both nostrils daily prn nasal congestion/ nose related allergy symptoms   furosemide 20 MG tablet Commonly known as:  LASIX Take 20 mg by mouth daily.   gabapentin 300 MG capsule Commonly known as:  NEURONTIN Take 600 mg by mouth 3 (three) times daily.   pantoprazole 40 MG tablet Commonly known as:  PROTONIX Take 40 mg by mouth daily.   polyethylene glycol packet Commonly known as:  MIRALAX / GLYCOLAX Take 17 g by mouth daily.   potassium chloride 10 MEQ tablet Commonly known as:  K-DUR,KLOR-CON Take 10 mEq by mouth daily.       No orders of the defined types were placed in this encounter.   Immunization History  Administered Date(s) Administered  . Influenza-Unspecified 09/07/2016, 09/25/2017    Social History   Tobacco Use  . Smoking status: Former Smoker    Years: 55.00    Last attempt to quit: 04/2016    Years since quitting: 1.7  . Smokeless tobacco: Never Used  Substance Use Topics  . Alcohol use: No    Frequency: Never    Family history is   Family History  Problem Relation Age of Onset  . Cancer Mother   . Hypertension Mother   .  Diabetes Mother   . Hypertension Father   . Cancer Sister   . Diabetes Sister   . Diabetes Maternal Grandmother   . Hypertension Maternal Grandmother   . Diabetes Maternal Grandfather   . Hypertension Maternal Grandfather       Review of Systems  DATA OBTAINED: from patient GENERAL:  no fevers, fatigue, appetite changes SKIN: No itching, or rash EYES: No eye pain, redness, discharge EARS: No earache, tinnitus, change in hearing NOSE: No congestion, drainage or bleeding  MOUTH/THROAT: No mouth or tooth pain, No sore throat RESPIRATORY: No cough, wheezing, SOB CARDIAC: No chest pain, palpitations, lower extremity edema  GI: No abdominal pain, No N/V/Todd or constipation, No  heartburn or reflux  GU: No dysuria, frequency or urgency, or incontinence  MUSCULOSKELETAL: No unrelieved bone/joint pain NEUROLOGIC: No headache, dizziness or focal weakness; some leg pain but tolerable PSYCHIATRIC: No c/o anxiety or sadness   Vitals:   01/13/18 1249  BP: 137/84  Pulse: 86  Resp: 18  Temp: 98 F (36.7 C)    SpO2 Readings from Last 1 Encounters:  10/09/17 97%   Body mass index is 41.01 kg/m.     Physical Exam  GENERAL APPEARANCE: Alert, conversant,  No acute distress.  SKIN: No diaphoresis rash HEAD: Normocephalic, atraumatic  EYES: Conjunctiva/lids clear. Pupils round, reactive. EOMs intact.  EARS: External exam WNL, canals clear. Hearing grossly normal.  NOSE: No deformity or discharge.  MOUTH/THROAT: Lips w/o lesions  RESPIRATORY: Breathing is even, unlabored. Lung sounds are clear   CARDIOVASCULAR: Heart RRR no murmurs, rubs or gallops.  Trace peripheral edema.   GASTROINTESTINAL: Abdomen is soft, non-tender, not distended w/ normal bowel sounds. GENITOURINARY: Bladder non tender, not distended  MUSCULOSKELETAL: No abnormal joints or musculature NEUROLOGIC:  Cranial nerves 2-12 grossly intact. Moves all extremities  PSYCHIATRIC: Mood and affect appropriate to situation, no behavioral issues  Patient Active Problem List   Diagnosis Date Noted  . GERD (gastroesophageal reflux disease) 12/21/2017  . Allergic rhinitis 12/21/2017  . Declining functional status 08/18/2017  . Decreased functional mobility 08/18/2017  . Chronic kidney disease (CKD) 06/02/2017  . Arthritis 06/02/2017  . GI bleed 02/15/2017  . Acute blood loss anemia 02/15/2017  . Escherichia coli urinary tract infection 02/15/2017  . Hypoxia 02/15/2017  . Edema of upper extremity 02/15/2017  . Bilateral lower extremity edema 02/15/2017  . Pulmonary embolism (HCC) 02/08/2017  . Hypertension 02/08/2017  . Acute kidney injury superimposed on chronic kidney disease (HCC) 02/08/2017  .  Polyneuropathy 02/08/2017  . Depression 02/08/2017      Labs reviewed: Basic Metabolic Panel:    Component Value Date/Time   NA 145 09/22/2017   K 4.5 09/22/2017   BUN 29 (A) 09/22/2017   CREATININE 0.9 09/22/2017   AST 21 01/27/2017   ALT 13 01/27/2017   ALKPHOS 66 01/27/2017    Recent Labs    08/08/17 08/18/17 09/22/17  NA 148* 145 145  K 4.4 4.5 4.5  BUN 27* 22* 29*  CREATININE 1.1 0.9 0.9   Liver Function Tests: Recent Labs    01/27/17  AST 21  ALT 13  ALKPHOS 66   No results for input(s): LIPASE, AMYLASE in the last 8760 hours. No results for input(s): AMMONIA in the last 8760 hours. CBC: Recent Labs    04/14/17 06/23/17  10/23/17 11/24/17 12/24/17  WBC 6.1 5   < > 7.1 5.2 5.8  NEUTROABS 4  --   --   --   --   --  HGB 9.0* 8.7   < > 9.3* 10.8* 11.4*  HCT 29* 28.9   < > 31* 35* 35*  MCV  --  78.6  --   --   --   --   PLT 237  --    < > 232 184 162   < > = values in this interval not displayed.   Lipid No results for input(s): CHOL, HDL, LDLCALC, TRIG in the last 8760 hours.  Cardiac Enzymes: No results for input(s): CKTOTAL, CKMB, CKMBINDEX, TROPONINI in the last 8760 hours. BNP: No results for input(s): BNP in the last 8760 hours. No results found for: MICROALBUR No results found for: HGBA1C No results found for: TSH No results found for: VITAMINB12 No results found for: FOLATE No results found for: IRON, TIBC, FERRITIN  Imaging and Procedures obtained prior to SNF admission: Patient was never admitted.   Not all labs, radiology exams or other studies done during hospitalization come through on my EPIC note; however they are reviewed by me.    Assessment and Plan  Depression Continues to do well with no medication; monitor  Arthritis No complaints to me or reported complaints; continue Tylenol 500 mg twice daily  Hypertension Controlled on Lasix 20 mg daily; continue Lasix 20 mg daily   Kristine Todd Todd. [name], MD

## 2018-01-14 NOTE — Assessment & Plan Note (Signed)
Continues to do well with no medication; monitor

## 2018-01-14 NOTE — Assessment & Plan Note (Signed)
Controlled on Lasix 20 mg daily; continue Lasix 20 mg daily

## 2018-01-14 NOTE — Assessment & Plan Note (Signed)
No complaints to me or reported complaints; continue Tylenol 500 mg twice daily

## 2018-02-09 ENCOUNTER — Encounter: Payer: Self-pay | Admitting: Internal Medicine

## 2018-02-09 ENCOUNTER — Non-Acute Institutional Stay (SKILLED_NURSING_FACILITY): Payer: Medicare Other | Admitting: Internal Medicine

## 2018-02-09 DIAGNOSIS — R0902 Hypoxemia: Secondary | ICD-10-CM

## 2018-02-09 DIAGNOSIS — R410 Disorientation, unspecified: Secondary | ICD-10-CM

## 2018-02-09 NOTE — Progress Notes (Signed)
:  Location[facility] Room Number: [phone] Place of Service:  SNF (31)  Randon Goldsmith. [name], MD  Patient Care Team: [name], MD as PCP - General (Internal Medicine)  Extended Emergency Contact Information Primary Emergency Contact: Laney Potash Address: [address]          [place], [place] [address] [place] of [place] Home Phone: [phone] Relation: Son Preferred language: English Interpreter needed? No Secondary Emergency Contact: Marcille Buffy Home Phone: [phone] Relation: Sister     Allergies: Patient has no known allergies.  Chief Complaint  Patient presents with  . Acute Visit    HPI: Patient is 73 y.o. female who nursing is asked me to see.  Patient is confused today.  It was reported that she was confused on Sunday.  Patient does recognize Monnette, she does not recognize me.  She is sitting up in her bed talking she did eat some and is drinking.  She has had no coughing but her O2 sat did drop into the 80s; came back into the low 90s with her 2 L of oxygen, she wears O2 chronically.  She has not had any fever she has not had any urinary symptoms.  Past Medical History:  Diagnosis Date  . Arthritis   . Depression   . Hypertension   . Pulmonary embolism [facility])     Past Surgical History:  Procedure Laterality Date  . ABDOMINAL HYSTERECTOMY    . BACK SURGERY    . REPLACEMENT TOTAL KNEE BILATERAL      Allergies as of 02/09/2018   No Known Allergies     Medication List       Accurate as of February 09, 2018  4:22 PM. Always use your most recent med list.        acetaminophen 325 MG tablet Commonly known as:  TYLENOL Take 650 mg by mouth every 4 (four) hours as needed for mild pain or fever.   acetaminophen 500 MG tablet Commonly known as:  TYLENOL Take 500 mg by mouth 2 (two) times daily.   albuterol (2.5 MG/3ML) 0.083% nebulizer solution Commonly known as:  PROVENTIL Take 2.5 mg by  nebulization every 4 (four) hours as needed for wheezing.   calcium carbonate 500 MG chewable tablet Commonly known as:  TUMS - dosed in mg elemental calcium Chew 2 tablets by mouth 3 (three) times daily as needed for indigestion or heartburn.   CLARITIN 10 MG tablet Generic drug:  loratadine Take 10 mg by mouth daily.   FLONASE SENSIMIST 27.5 MCG/SPRAY nasal spray Generic drug:  fluticasone Place 2 sprays into the nose daily. apply 2 sprays to both nostrils daily prn nasal congestion/ nose related allergy symptoms   furosemide 20 MG tablet Commonly known as:  LASIX Take 20 mg by mouth daily.   gabapentin 300 MG capsule Commonly known as:  NEURONTIN Take 600 mg by mouth 3 (three) times daily.   pantoprazole 40 MG tablet Commonly known as:  PROTONIX Take 40 mg by mouth daily.   polyethylene glycol packet Commonly known as:  MIRALAX / GLYCOLAX Take 17 g by mouth daily.   potassium chloride 10 MEQ tablet Commonly known as:  K-DUR,KLOR-CON Take 10 mEq by mouth daily.       No orders of the defined types were placed in this encounter.   Immunization History  Administered Date(s) Administered  . Influenza-Unspecified 09/07/2016, 09/25/2017    Social History   Tobacco Use  .  Smoking status: Former Smoker    Years: 55.00    Last attempt to quit: 04/2016    Years since quitting: 1.8  . Smokeless tobacco: Never Used  Substance Use Topics  . Alcohol use: No    Frequency: Never    Family history is   Family History  Problem Relation Age of Onset  . Cancer Mother   . Hypertension Mother   . Diabetes Mother   . Hypertension Father   . Cancer Sister   . Diabetes Sister   . Diabetes Maternal Grandmother   . Hypertension Maternal Grandmother   . Diabetes Maternal Grandfather   . Hypertension Maternal Grandfather       Review of Systems  DATA OBTAINED: from patient, nurse, medical record, family member GENERAL:  no fevers, fatigue, appetite changes SKIN:  No itching, or rash EYES: No eye pain, redness, discharge EARS: No earache, tinnitus, change in hearing NOSE: No congestion, drainage or bleeding  MOUTH/THROAT: No mouth or tooth pain, No sore throat RESPIRATORY: No cough, wheezing, SOB; lower O2 saturation in usual CARDIAC: No chest pain, palpitations, lower extremity edema  GI: No abdominal pain, No N/V/D or constipation, No heartburn or reflux  GU: No dysuria, frequency or urgency, or incontinence  MUSCULOSKELETAL: No unrelieved bone/joint pain NEUROLOGIC: No headache, dizziness or focal weakness PSYCHIATRIC: Confusion  Vitals:   02/09/18 1619  BP: 140/70  Pulse: 78  Resp: 18  Temp: 98 F (36.7 C)    SpO2 Readings from Last 1 Encounters:  10/09/17 97%   Body mass index is 40.23 kg/m.     Physical Exam  GENERAL APPEARANCE: Alert, conversant,  No acute distress.  SKIN: No diaphoresis rash HEAD: Normocephalic, atraumatic  EYES: Conjunctiva/lids clear. Pupils round, reactive. EOMs intact.  EARS: External exam WNL, canals clear. Hearing grossly normal.  NOSE: No deformity or discharge.  MOUTH/THROAT: Lips w/o lesions  RESPIRATORY: Breathing is even, unlabored. Lung sounds are clear   CARDIOVASCULAR: Heart RRR no murmurs, rubs or gallops. No peripheral edema.   GASTROINTESTINAL: Abdomen is soft, non-tender, not distended w/ normal bowel sounds. GENITOURINARY: Bladder non tender, not distended  MUSCULOSKELETAL: No abnormal joints or musculature NEUROLOGIC:  Cranial nerves 2-12 grossly intact. Moves all extremities  PSYCHIATRIC: Confusion, no behavioral issues  Patient Active Problem List   Diagnosis Date Noted  . GERD (gastroesophageal reflux disease) 12/21/2017  . Allergic rhinitis 12/21/2017  . Declining functional status 08/18/2017  . Decreased functional mobility 08/18/2017  . Chronic kidney disease (CKD) 06/02/2017  . Arthritis 06/02/2017  . GI bleed 02/15/2017  . Acute blood loss anemia 02/15/2017  .  Escherichia coli urinary tract infection 02/15/2017  . Hypoxia 02/15/2017  . Edema of upper extremity 02/15/2017  . Bilateral lower extremity edema 02/15/2017  . Pulmonary embolism (HCC) 02/08/2017  . Hypertension 02/08/2017  . Acute kidney injury superimposed on chronic kidney disease (HCC) 02/08/2017  . Polyneuropathy 02/08/2017  . Depression 02/08/2017      Labs reviewed: Basic Metabolic Panel:    Component Value Date/Time   NA 145 09/22/2017   K 4.5 09/22/2017   BUN 29 (A) 09/22/2017   CREATININE 0.9 09/22/2017   AST 21 01/27/2017   ALT 13 01/27/2017   ALKPHOS 66 01/27/2017    Recent Labs    08/08/17 08/18/17 09/22/17  NA 148* 145 145  K 4.4 4.5 4.5  BUN 27* 22* 29*  CREATININE 1.1 0.9 0.9   Liver Function Tests: No results for input(s): AST, ALT, ALKPHOS, BILITOT, PROT, ALBUMIN  in the last 8760 hours. No results for input(s): LIPASE, AMYLASE in the last 8760 hours. No results for input(s): AMMONIA in the last 8760 hours. CBC: Recent Labs    04/14/17 06/23/17  10/23/17 11/24/17 12/24/17  WBC 6.1 5   < > 7.1 5.2 5.8  NEUTROABS 4  --   --   --   --   --   HGB 9.0* 8.7   < > 9.3* 10.8* 11.4*  HCT 29* 28.9   < > 31* 35* 35*  MCV  --  78.6  --   --   --   --   PLT 237  --    < > 232 184 162   < > = values in this interval not displayed.   Lipid No results for input(s): CHOL, HDL, LDLCALC, TRIG in the last 8760 hours.  Cardiac Enzymes: No results for input(s): CKTOTAL, CKMB, CKMBINDEX, TROPONINI in the last 8760 hours. BNP: No results for input(s): BNP in the last 8760 hours. No results found for: MICROALBUR No results found for: HGBA1C No results found for: TSH No results found for: VITAMINB12 No results found for: FOLATE No results found for: IRON, TIBC, FERRITIN  Imaging and Procedures obtained prior to SNF admission: Patient was never admitted.   Not all labs, radiology exams or other studies done during hospitalization come through on my EPIC note;  however they are reviewed by me.    Assessment and Plan  New onset confusion/desaturation- lung sounds are nonrevealing no fluid no rails no wheezing, no fever no urinary symptoms, no reports of urine with odor; have ordered chest x-ray PA and lateral, urine for C&S CBC and CMP    Kristine Shoberg D. Lyn HollingsheadAlexander, MD

## 2018-02-10 ENCOUNTER — Encounter: Payer: Self-pay | Admitting: Internal Medicine

## 2018-02-10 ENCOUNTER — Non-Acute Institutional Stay (SKILLED_NURSING_FACILITY): Payer: Medicare Other | Admitting: Internal Medicine

## 2018-02-10 DIAGNOSIS — R400 Somnolence: Secondary | ICD-10-CM | POA: Diagnosis not present

## 2018-02-10 DIAGNOSIS — E1300 Other specified diabetes mellitus with hyperosmolarity without nonketotic hyperglycemic-hyperosmolar coma (NKHHC): Secondary | ICD-10-CM | POA: Diagnosis not present

## 2018-02-10 NOTE — Progress Notes (Signed)
:  Location:  [facility] Room Number: [phone] Place of Service:  SNF (31)  Randon Goldsmith. [name], MD  Patient Care Team: [name], MD as PCP - General (Internal Medicine)  Extended Emergency Contact Information Primary Emergency Contact: Laney Potash Address: [address]          [place], [place] [address] Darden Amber of [place] Home Phone: [phone] Relation: Son Preferred language: English Interpreter needed? No Secondary Emergency Contact: Marcille Buffy Home Phone: [phone] Relation: Sister     Allergies: Patient has no known allergies.  Chief Complaint  Patient presents with  . Acute Visit    HPI: Patient is 73 y.o. female who is being seen in follow-up from yesterday. Patient's chest x-ray has come back negative for pneumonia or any other acute process, but patient today is somnolent, minimally responsive.  Patient is still on O2 and her sats are fine.  Patient ate some breakfast this morning and drank fluids.  Past Medical History:  Diagnosis Date  . Arthritis   . Depression   . Hypertension   . Pulmonary embolism [facility])     Past Surgical History:  Procedure Laterality Date  . ABDOMINAL HYSTERECTOMY    . BACK SURGERY    . REPLACEMENT TOTAL KNEE BILATERAL      Allergies as of 02/10/2018   No Known Allergies     Medication List       Accurate as of February 10, 2018  3:20 PM. Always use your most recent med list.        acetaminophen 325 MG tablet Commonly known as:  TYLENOL Take 650 mg by mouth every 4 (four) hours as needed for mild pain or fever.   acetaminophen 500 MG tablet Commonly known as:  TYLENOL Take 500 mg by mouth 2 (two) times daily.   albuterol (2.5 MG/3ML) 0.083% nebulizer solution Commonly known as:  PROVENTIL Take 2.5 mg by nebulization every 4 (four) hours as needed for wheezing.   calcium carbonate 500 MG chewable tablet Commonly known as:  TUMS - dosed in mg elemental  calcium Chew 2 tablets by mouth 3 (three) times daily as needed for indigestion or heartburn.   CLARITIN 10 MG tablet Generic drug:  loratadine Take 10 mg by mouth daily.   FLONASE SENSIMIST 27.5 MCG/SPRAY nasal spray Generic drug:  fluticasone Place 2 sprays into the nose daily. apply 2 sprays to both nostrils daily prn nasal congestion/ nose related allergy symptoms   furosemide 20 MG tablet Commonly known as:  LASIX Take 20 mg by mouth daily.   gabapentin 300 MG capsule Commonly known as:  NEURONTIN Take 600 mg by mouth 3 (three) times daily.   pantoprazole 40 MG tablet Commonly known as:  PROTONIX Take 40 mg by mouth daily.   polyethylene glycol packet Commonly known as:  MIRALAX / GLYCOLAX Take 17 g by mouth daily.   potassium chloride 10 MEQ tablet Commonly known as:  K-DUR,KLOR-CON Take 10 mEq by mouth daily.       No orders of the defined types were placed in this encounter.   Immunization History  Administered Date(s) Administered  . Influenza-Unspecified 09/07/2016, 09/25/2017    Social History   Tobacco Use  . Smoking status: Former Smoker    Years: 55.00    Last attempt to quit: 04/2016    Years since quitting: 1.8  . Smokeless tobacco: Never Used  Substance Use Topics  . Alcohol use: No  Frequency: Never    Family history is   Family History  Problem Relation Age of Onset  . Cancer Mother   . Hypertension Mother   . Diabetes Mother   . Hypertension Father   . Cancer Sister   . Diabetes Sister   . Diabetes Maternal Grandmother   . Hypertension Maternal Grandmother   . Diabetes Maternal Grandfather   . Hypertension Maternal Grandfather       Review of Systems unable to obtain secondary to patient's condition     Vitals:   02/10/18 1516  BP: 140/70  Pulse: 78  Resp: 18  Temp: 98 F (36.7 C)    SpO2 Readings from Last 1 Encounters:  10/09/17 97%   Body mass index is 40.23 kg/m.     Physical Exam  GENERAL  APPEARANCE: Somnolent, opens eyes to voice, does not speak SKIN: No diaphoresis rash HEAD: Normocephalic, atraumatic  EYES: Conjunctiva/lids clear. Pupils round, reactive. EOMs intact.  EARS: External exam WNL, canals clear. Hearing grossly normal.  NOSE: No deformity or discharge.  MOUTH/THROAT: Lips w/o lesions  RESPIRATORY: Breathing is even, unlabored. Lung sounds are clear   CARDIOVASCULAR: Heart regular, increased heart rate no murmurs, rubs or gallops. No peripheral edema.   GASTROINTESTINAL: Abdomen is soft, non-tender, not distended w/ normal bowel sounds. GENITOURINARY: Bladder non tender, not distended  MUSCULOSKELETAL: No abnormal joints or musculature NEUROLOGIC:  Cranial nerves 2-12 grossly intact: Patient did not move during interview PSYCHIATRIC: Not applicable  Patient Active Problem List   Diagnosis Date Noted  . GERD (gastroesophageal reflux disease) 12/21/2017  . Allergic rhinitis 12/21/2017  . Declining functional status 08/18/2017  . Decreased functional mobility 08/18/2017  . Chronic kidney disease (CKD) 06/02/2017  . Arthritis 06/02/2017  . GI bleed 02/15/2017  . Acute blood loss anemia 02/15/2017  . Escherichia coli urinary tract infection 02/15/2017  . Hypoxia 02/15/2017  . Edema of upper extremity 02/15/2017  . Bilateral lower extremity edema 02/15/2017  . Pulmonary embolism (HCC) 02/08/2017  . Hypertension 02/08/2017  . Acute kidney injury superimposed on chronic kidney disease (HCC) 02/08/2017  . Polyneuropathy 02/08/2017  . Depression 02/08/2017      Labs reviewed: Basic Metabolic Panel:    Component Value Date/Time   NA 145 09/22/2017   K 4.5 09/22/2017   BUN 29 (A) 09/22/2017   CREATININE 0.9 09/22/2017   AST 21 01/27/2017   ALT 13 01/27/2017   ALKPHOS 66 01/27/2017    Recent Labs    08/08/17 08/18/17 09/22/17  NA 148* 145 145  K 4.4 4.5 4.5  BUN 27* 22* 29*  CREATININE 1.1 0.9 0.9   Liver Function Tests: No results for  input(s): AST, ALT, ALKPHOS, BILITOT, PROT, ALBUMIN in the last 8760 hours. No results for input(s): LIPASE, AMYLASE in the last 8760 hours. No results for input(s): AMMONIA in the last 8760 hours. CBC: Recent Labs    04/14/17 06/23/17  10/23/17 11/24/17 12/24/17  WBC 6.1 5   < > 7.1 5.2 5.8  NEUTROABS 4  --   --   --   --   --   HGB 9.0* 8.7   < > 9.3* 10.8* 11.4*  HCT 29* 28.9   < > 31* 35* 35*  MCV  --  78.6  --   --   --   --   PLT 237  --    < > 232 184 162   < > = values in this interval not displayed.   Lipid  No results for input(s): CHOL, HDL, LDLCALC, TRIG in the last 8760 hours.  Cardiac Enzymes: No results for input(s): CKTOTAL, CKMB, CKMBINDEX, TROPONINI in the last 8760 hours. BNP: No results for input(s): BNP in the last 8760 hours. No results found for: MICROALBUR No results found for: HGBA1C No results found for: TSH No results found for: VITAMINB12 No results found for: FOLATE No results found for: IRON, TIBC, FERRITIN  Imaging and Procedures obtained prior to SNF admission: Patient was never admitted.   Not all labs, radiology exams or other studies done during hospitalization come through on my EPIC note; however they are reviewed by me.    Assessment and Plan  Somnolence/hyperosmolarity due to diabetes mellitus type 2- patient is labs are pending, as well as a urine, but there is no time to wait for these patient needs to be sent to hospital.  I would have suspected sepsis, but when the first responders checked patient's glucose and it was greater than 600, I realize patient is on hyperosmolar syndrome secondary to diabetes mellitus 2, this is her presenting symptom for diabetes-no prior history.   I am spent greater than 35 minutes;> 50% of time with patient was spent reviewing records, labs, tests and studies, counseling and developing plan of care  [name] D. Lyn HollingsheadAlexander, MD

## 2018-02-14 ENCOUNTER — Encounter: Payer: Self-pay | Admitting: Internal Medicine

## 2018-02-19 ENCOUNTER — Non-Acute Institutional Stay (SKILLED_NURSING_FACILITY): Payer: Medicare Other | Admitting: Internal Medicine

## 2018-02-19 ENCOUNTER — Encounter: Payer: Self-pay | Admitting: Internal Medicine

## 2018-02-19 DIAGNOSIS — E870 Hyperosmolality and hypernatremia: Secondary | ICD-10-CM

## 2018-02-19 DIAGNOSIS — E1101 Type 2 diabetes mellitus with hyperosmolarity with coma: Secondary | ICD-10-CM

## 2018-02-19 DIAGNOSIS — E876 Hypokalemia: Secondary | ICD-10-CM

## 2018-02-19 DIAGNOSIS — N179 Acute kidney failure, unspecified: Secondary | ICD-10-CM

## 2018-02-19 DIAGNOSIS — I2699 Other pulmonary embolism without acute cor pulmonale: Secondary | ICD-10-CM

## 2018-02-19 DIAGNOSIS — K219 Gastro-esophageal reflux disease without esophagitis: Secondary | ICD-10-CM

## 2018-02-19 DIAGNOSIS — I10 Essential (primary) hypertension: Secondary | ICD-10-CM | POA: Diagnosis not present

## 2018-02-19 DIAGNOSIS — E119 Type 2 diabetes mellitus without complications: Secondary | ICD-10-CM | POA: Diagnosis not present

## 2018-02-19 DIAGNOSIS — L89152 Pressure ulcer of sacral region, stage 2: Secondary | ICD-10-CM

## 2018-02-19 DIAGNOSIS — D508 Other iron deficiency anemias: Secondary | ICD-10-CM

## 2018-02-19 DIAGNOSIS — J3089 Other allergic rhinitis: Secondary | ICD-10-CM

## 2018-02-19 DIAGNOSIS — N183 Chronic kidney disease, stage 3 (moderate): Secondary | ICD-10-CM

## 2018-02-19 NOTE — Progress Notes (Signed)
:  Location:  [facility] Room Number: [phone] Place of Service:  SNF (31)  Randon Goldsmith. [name], MD  Patient Care Team: [name], MD as PCP - General (Internal Medicine)  Extended Emergency Contact Information Primary Emergency Contact: Laney Potash Address: [address]          [place], [place] [address] Darden [place] of [place] Home Phone: [phone] Relation: Son Preferred language: English Interpreter needed? No Secondary Emergency Contact: Marcille Buffy Home Phone: [phone] Relation: Sister     Allergies: Patient has no known allergies.  Chief Complaint  Patient presents with  . New Admit To SNF    Admit to [facility]    HPI: Patient is 73 y.o. female with peripheral neuropathy, hypertension, obesity, who was brought to [facility] because of increasing lethargy over the past several days.  Patient's blood sugar at SNF was greater than 600.  Patient had no history of diabetes.  In the emergency department patient was found to have a glucose level of 1110 hyponatremia acute kidney injury and a UTI.  Patient was admitted to [facility] from 2/4-12 where she was treated with IV fluids, IV insulin, potassium and empiric IV antibiotics.  Hemoglobin A1c was greater than 15.  Patient was started on Lantus and sliding scale insulin and sugar had been under reasonable control.  Patient's hyponatremia and acute kidney injury improved with IV fluids.  UTI was ruled out so patient was taken off of antibiotics.  Patient was hypertensive while hospitalized and was put on metoprolol.  With history of PE but anticoagulation was stopped because of anemia and GI bleed.  Patient is admitted back to skilled nursing facility for OT/PT and for residential care.  While at skilled nursing facility patient will be followed for her iron deficiency anemia and treated with Feratab, GERD treated with Protonix and allergic rhinitis  treated with Claritin.   Past Medical History:  Diagnosis Date  . Arthritis   . Depression   . Hypertension   . Pulmonary embolism [facility])     Past Surgical History:  Procedure Laterality Date  . ABDOMINAL HYSTERECTOMY    . BACK SURGERY    . REPLACEMENT TOTAL KNEE BILATERAL      Allergies as of 02/19/2018   No Known Allergies     Medication List       Accurate as of February 19, 2018  1:38 PM. Always use your most recent med list.        acetaminophen 325 MG tablet Commonly known as:  TYLENOL Take 650 mg by mouth every 4 (four) hours as needed for mild pain or fever.   acetaminophen 500 MG tablet Commonly known as:  TYLENOL Take 500 mg by mouth 2 (two) times daily.   albuterol (2.5 MG/3ML) 0.083% nebulizer solution Commonly known as:  PROVENTIL Take 2.5 mg by nebulization every 4 (four) hours as needed for wheezing.   aspirin EC 81 MG tablet Take 81 mg by mouth daily.   CLARITIN 10 MG tablet Generic drug:  loratadine Take 10 mg by mouth daily.   ferrous sulfate 325 (65 FE) MG tablet Take 325 mg by mouth daily with breakfast.   FLONASE SENSIMIST 27.5 MCG/SPRAY nasal spray Generic drug:  fluticasone Place 2 sprays into the nose daily. apply 2 sprays to both nostrils daily prn nasal congestion/ nose related allergy symptoms   furosemide 20 MG tablet Commonly known as:  LASIX Take 20  mg by mouth daily.   gabapentin 300 MG capsule Commonly known as:  NEURONTIN Take 600 mg by mouth 3 (three) times daily.   insulin glargine 100 UNIT/ML injection Commonly known as:  LANTUS Inject 20 Units into the skin daily.   insulin lispro 100 UNIT/ML injection Commonly known as:  HUMALOG Inject 2-12 Units into the skin every 4 (four) hours as needed for high blood sugar.   metoprolol tartrate 25 MG tablet Commonly known as:  LOPRESSOR Take 12.5 mg by mouth 2 (two) times daily.   pantoprazole 40 MG tablet Commonly known as:  PROTONIX Take 40 mg by mouth daily.     polyethylene glycol packet Commonly known as:  MIRALAX / GLYCOLAX Take 17 g by mouth daily.   potassium chloride 10 MEQ tablet Commonly known as:  K-DUR,KLOR-CON Take 10 mEq by mouth daily.       No orders of the defined types were placed in this encounter.   Immunization History  Administered Date(s) Administered  . Influenza-Unspecified 09/07/2016, 09/25/2017    Social History   Tobacco Use  . Smoking status: Former Smoker    Years: 55.00    Last attempt to quit: 04/2016    Years since quitting: 1.8  . Smokeless tobacco: Never Used  Substance Use Topics  . Alcohol use: No    Frequency: Never    Family history is   Family History  Problem Relation Age of Onset  . Cancer Mother   . Hypertension Mother   . Diabetes Mother   . Hypertension Father   . Cancer Sister   . Diabetes Sister   . Diabetes Maternal Grandmother   . Hypertension Maternal Grandmother   . Diabetes Maternal Grandfather   . Hypertension Maternal Grandfather       Review of Systems  DATA OBTAINED: from patient GENERAL:  no fevers, fatigue, appetite changes SKIN: No itching, or rash EYES: No eye pain, redness, discharge EARS: No earache, tinnitus, change in hearing NOSE: No congestion, drainage or bleeding  MOUTH/THROAT: No mouth or tooth pain, No sore throat RESPIRATORY: No cough, wheezing, SOB CARDIAC: No chest pain, palpitations, lower extremity edema  GI: No abdominal pain, No N/V/D or constipation, No heartburn or reflux  GU: No dysuria, frequency or urgency, or incontinence  MUSCULOSKELETAL: No unrelieved bone/joint pain NEUROLOGIC: No headache, dizziness or focal weakness PSYCHIATRIC: No c/o anxiety or sadness   Vitals:   02/19/18 1329  BP: (!) 165/93  Pulse: 88  Resp: 18  Temp: 98.6 F (37 C)    SpO2 Readings from Last 1 Encounters:  10/09/17 97%   Body mass index is 40.04 kg/m.     Physical Exam  GENERAL APPEARANCE: Alert, conversant,  No acute distress.   SKIN: No diaphoresis rash HEAD: Normocephalic, atraumatic  EYES: Conjunctiva/lids clear. Pupils round, reactive. EOMs intact.  EARS: External exam WNL, canals clear. Hearing grossly normal.  NOSE: No deformity or discharge.  MOUTH/THROAT: Lips w/o lesions  RESPIRATORY: Breathing is even, unlabored. Lung sounds are clear   CARDIOVASCULAR: Heart RRR no murmurs, rubs or gallops. No peripheral edema.   GASTROINTESTINAL: Abdomen is soft, non-tender, not distended w/ normal bowel sounds. GENITOURINARY: Bladder non tender, not distended  MUSCULOSKELETAL: No abnormal joints or musculature NEUROLOGIC:  Cranial nerves 2-12 grossly intact. Moves all extremities  PSYCHIATRIC: Mood and affect appropriate to situation, no behavioral issues  Patient Active Problem List   Diagnosis Date Noted  . GERD (gastroesophageal reflux disease) 12/21/2017  . Allergic rhinitis 12/21/2017  .  Declining functional status 08/18/2017  . Decreased functional mobility 08/18/2017  . Chronic kidney disease (CKD) 06/02/2017  . Arthritis 06/02/2017  . GI bleed 02/15/2017  . Acute blood loss anemia 02/15/2017  . Escherichia coli urinary tract infection 02/15/2017  . Hypoxia 02/15/2017  . Edema of upper extremity 02/15/2017  . Bilateral lower extremity edema 02/15/2017  . Pulmonary embolism (HCC) 02/08/2017  . Hypertension 02/08/2017  . Acute kidney injury superimposed on chronic kidney disease (HCC) 02/08/2017  . Polyneuropathy 02/08/2017  . Depression 02/08/2017      Labs reviewed: Basic Metabolic Panel:    Component Value Date/Time   NA 145 09/22/2017   K 4.5 09/22/2017   BUN 29 (A) 09/22/2017   CREATININE 0.9 09/22/2017   AST 21 01/27/2017   ALT 13 01/27/2017   ALKPHOS 66 01/27/2017    Recent Labs    08/08/17 08/18/17 09/22/17  NA 148* 145 145  K 4.4 4.5 4.5  BUN 27* 22* 29*  CREATININE 1.1 0.9 0.9   Liver Function Tests: No results for input(s): AST, ALT, ALKPHOS, BILITOT, PROT, ALBUMIN in  the last 8760 hours. No results for input(s): LIPASE, AMYLASE in the last 8760 hours. No results for input(s): AMMONIA in the last 8760 hours. CBC: Recent Labs    04/14/17 06/23/17  10/23/17 11/24/17 12/24/17  WBC 6.1 5   < > 7.1 5.2 5.8  NEUTROABS 4  --   --   --   --   --   HGB 9.0* 8.7   < > 9.3* 10.8* 11.4*  HCT 29* 28.9   < > 31* 35* 35*  MCV  --  78.6  --   --   --   --   PLT 237  --    < > 232 184 162   < > = values in this interval not displayed.   Lipid No results for input(s): CHOL, HDL, LDLCALC, TRIG in the last 8760 hours.  Cardiac Enzymes: No results for input(s): CKTOTAL, CKMB, CKMBINDEX, TROPONINI in the last 8760 hours. BNP: No results for input(s): BNP in the last 8760 hours. No results found for: MICROALBUR No results found for: HGBA1C No results found for: TSH No results found for: VITAMINB12 No results found for: FOLATE No results found for: IRON, TIBC, FERRITIN  Imaging and Procedures obtained prior to SNF admission: Patient was never admitted.   Not all labs, radiology exams or other studies done during hospitalization come through on my EPIC note; however they are reviewed by me.    Assessment and Plan  Hyperosmolar hyperglycemic crisis/new onset diabetes mellitus type 2- patient was admitted to ICU and initially treated with an insulin drip, IV fluids potassium and empiric antibiotics.  Patient's A1c was found to be greater than 15.  As patient improved she was started on Lantus and sliding scale with insulin coverage and blood sugars have been under reasonable control.   SNF- admitted for OT/PT and for residential care.  She will be on Lantus 20 units every evening and Humalog sliding scale before meals and nightly  Acute renal failure on chronic kidney disease stage III/hypernatremia/hypokalemia-improved with IV fluids and replacement of potassium, creatinine is back to baseline SNF- we will follow-up BMP  Patient was treated with IV Rocephin and  then [facility] for UTI but patient did not have a UTI so antibiotics were stopped  Hypertension-patient was hypertensive in the hospital and metoprolol was started SNF- we will continue metoprolol 12.5 mg twice daily  Elevated troponin-patient  was started on metoprolol and ASA, even though the troponin elevation was felt to be due to demand ischemia from kidney disease SNF- will continue ASA 81 mg daily and metoprolol 12.5 mg twice daily  History of PE- patient not on anticoagulation secondary to anemia and prior GI bleed  Decubitus ulcer coccyx stage II SNF-we will continue wound care per wound care nurse  Iron deficiency anemia SNF- continue Feratab 325 mg daily  GERD SNF- not stated as uncontrolled; continue Protonix 40 mg daily  Allergic rhinitis SNF-controlled; continue Claritin 10 mg daily  Peripheral neuropathy patient's Neurontin was reduced based on renal function to 300 mg twice daily SNF- continue Neurontin 300 mg twice daily   Time spent greater than 45 minutes;> 50% of time with patient was spent reviewing records, labs, tests and studies, counseling and developing plan of care  Thurston Holenne D. Lyn HollingsheadAlexander, MD

## 2018-02-21 ENCOUNTER — Encounter: Payer: Self-pay | Admitting: Internal Medicine

## 2018-02-21 DIAGNOSIS — N179 Acute kidney failure, unspecified: Secondary | ICD-10-CM | POA: Insufficient documentation

## 2018-02-21 DIAGNOSIS — E1101 Type 2 diabetes mellitus with hyperosmolarity with coma: Secondary | ICD-10-CM | POA: Insufficient documentation

## 2018-02-21 DIAGNOSIS — E876 Hypokalemia: Secondary | ICD-10-CM | POA: Insufficient documentation

## 2018-02-21 DIAGNOSIS — E119 Type 2 diabetes mellitus without complications: Secondary | ICD-10-CM | POA: Insufficient documentation

## 2018-02-21 DIAGNOSIS — N183 Chronic kidney disease, stage 3 unspecified: Secondary | ICD-10-CM | POA: Insufficient documentation

## 2018-02-21 DIAGNOSIS — E870 Hyperosmolality and hypernatremia: Secondary | ICD-10-CM | POA: Insufficient documentation

## 2018-02-21 DIAGNOSIS — L89152 Pressure ulcer of sacral region, stage 2: Secondary | ICD-10-CM | POA: Insufficient documentation

## 2018-02-25 LAB — BASIC METABOLIC PANEL
BUN: 17 (ref 4–21)
Creatinine: 1.4 — AB (ref 0.5–1.1)
Glucose: 196
Potassium: 3.2 — AB (ref 3.4–5.3)
Sodium: 144 (ref 137–147)

## 2018-02-25 LAB — CBC AND DIFFERENTIAL
HCT: 34 — AB (ref 36–46)
HEMOGLOBIN: 10.9 — AB (ref 12.0–16.0)
Platelets: 252 (ref 150–399)
WBC: 7.5

## 2018-02-25 LAB — LIPID PANEL
Cholesterol: 140 (ref 0–200)
HDL: 47 (ref 35–70)
LDL Cholesterol: 68
LDl/HDL Ratio: 3
Triglycerides: 142 (ref 40–160)

## 2018-02-25 LAB — VITAMIN D 25 HYDROXY (VIT D DEFICIENCY, FRACTURES): VIT D 25 HYDROXY: 22.91

## 2018-02-25 LAB — HEPATIC FUNCTION PANEL
ALT: 8 (ref 7–35)
AST: 15 (ref 13–35)
Alkaline Phosphatase: 90 (ref 25–125)
Bilirubin, Total: 0.3

## 2018-02-25 LAB — TSH: TSH: 2.02 (ref 0.41–5.90)

## 2018-02-25 LAB — HEMOGLOBIN A1C: Hemoglobin A1C: 14.4

## 2018-03-03 LAB — BASIC METABOLIC PANEL WITH GFR
BUN: 15 (ref 4–21)
Creatinine: 0.9 (ref 0.5–1.1)
Glucose: 197
Potassium: 3.8 (ref 3.4–5.3)
Sodium: 142 (ref 137–147)

## 2018-03-19 ENCOUNTER — Non-Acute Institutional Stay (SKILLED_NURSING_FACILITY): Payer: Medicare Other | Admitting: Internal Medicine

## 2018-03-19 ENCOUNTER — Encounter: Payer: Self-pay | Admitting: Internal Medicine

## 2018-03-19 DIAGNOSIS — Z049 Encounter for examination and observation for unspecified reason: Secondary | ICD-10-CM

## 2018-03-19 NOTE — Progress Notes (Signed)
:  Location:  [facility] Room Number: [phone] Place of Service:  SNF (31)  Kristine Todd. Kristine Hollingshead, MD  Patient Care Team: [name], MD as PCP - General (Internal Medicine)  Extended Emergency Contact Information Primary Emergency Contact: Laney Potash Address: [address]          [place], [place] [address] Darden Amber of [place] Home Phone: [phone] Relation: Son Preferred language: English Interpreter needed? No Secondary Emergency Contact: Marcille Buffy Home Phone: [phone] Relation: Sister     Allergies: Patient has no known allergies.  Chief Complaint  Patient presents with  . Acute Visit    HPI: Patient is 73 y.o. female who is being seen today to check on her fluid status.  There is some question as to whether present patient has gained weight or not so I am checking on her today.  Patient denies shortness of breath or chest pain, dyspnea on exertion or PND.  Patient says she feels better after her hospitalization every day.  Patient has no lower extremity edema  Past Medical History:  Diagnosis Date  . Arthritis   . Depression   . Hypertension   . Pulmonary embolism [facility])     Past Surgical History:  Procedure Laterality Date  . ABDOMINAL HYSTERECTOMY    . BACK SURGERY    . REPLACEMENT TOTAL KNEE BILATERAL      Allergies as of 03/19/2018   No Known Allergies     Medication List       Accurate as of March 19, 2018  4:04 PM. Always use your most recent med list.        acetaminophen 325 MG tablet Commonly known as:  TYLENOL Take 650 mg by mouth every 4 (four) hours as needed for mild pain or fever.   acetaminophen 500 MG tablet Commonly known as:  TYLENOL Take 500 mg by mouth 2 (two) times daily.   aspirin EC 81 MG tablet Take 81 mg by mouth daily.   Claritin 10 MG tablet Generic drug:  loratadine Take 10 mg by mouth daily.   ferrous sulfate 325 (65 FE) MG tablet Take 325 mg by mouth daily  with breakfast.   Flonase Sensimist 27.5 MCG/SPRAY nasal spray Generic drug:  fluticasone Place 2 sprays into the nose daily. apply 2 sprays to both nostrils daily prn nasal congestion/ nose related allergy symptoms   furosemide 20 MG tablet Commonly known as:  LASIX Take 20 mg by mouth daily.   gabapentin 300 MG capsule Commonly known as:  NEURONTIN Take 600 mg by mouth 3 (three) times daily.   insulin glargine 100 UNIT/ML injection Commonly known as:  LANTUS Inject 20 Units into the skin daily.   insulin lispro 100 UNIT/ML injection Commonly known as:  HUMALOG Inject 2-12 Units into the skin every 4 (four) hours as needed for high blood sugar.   ipratropium-albuterol 0.5-2.5 (3) MG/3ML Soln Commonly known as:  DUONEB Take 3 mLs by nebulization every 6 (six) hours as needed.   metoprolol tartrate 25 MG tablet Commonly known as:  LOPRESSOR Take 12.5 mg by mouth 2 (two) times daily.   pantoprazole 40 MG tablet Commonly known as:  PROTONIX Take 40 mg by mouth daily.   polyethylene glycol packet Commonly known as:  MIRALAX / GLYCOLAX Take 17 g by mouth daily.   potassium chloride 10 MEQ tablet Commonly known as:  K-DUR,KLOR-CON Take 10 mEq by mouth daily.   Vitamin D (Ergocalciferol) 1.25  MG (50000 UT) Caps capsule Commonly known as:  DRISDOL Take 50,000 Units by mouth every 7 (seven) days. Start taking on:  July 09, 2018       No orders of the defined types were placed in this encounter.   Immunization History  Administered Date(s) Administered  . Influenza-Unspecified 09/07/2016, 09/25/2017    Social History   Tobacco Use  . Smoking status: Former Smoker    Years: 55.00    Last attempt to quit: 04/2016    Years since quitting: 1.9  . Smokeless tobacco: Never Used  Substance Use Topics  . Alcohol use: No    Frequency: Never    Family history is   Family History  Problem Relation Age of Onset  . Cancer Mother   . Hypertension Mother   .  Diabetes Mother   . Hypertension Father   . Cancer Sister   . Diabetes Sister   . Diabetes Maternal Grandmother   . Hypertension Maternal Grandmother   . Diabetes Maternal Grandfather   . Hypertension Maternal Grandfather       Review of Systems  DATA OBTAINED: from patient, nurse GENERAL:  no fevers, fatigue, appetite changes SKIN: No itching, or rash EYES: No eye pain, redness, discharge EARS: No earache, tinnitus, change in hearing NOSE: No congestion, drainage or bleeding  MOUTH/THROAT: No mouth or tooth pain, No sore throat RESPIRATORY: No cough, wheezing, SOB CARDIAC: No chest pain, palpitations, lower extremity edema  GI: No abdominal pain, No N/V/D or constipation, No heartburn or reflux  GU: No dysuria, frequency or urgency, or incontinence  MUSCULOSKELETAL: No unrelieved bone/joint pain NEUROLOGIC: No headache, dizziness or focal weakness PSYCHIATRIC: No c/o anxiety or sadness   Vitals:   03/19/18 1558  BP: 122/68  Pulse: 74  Resp: 16  Temp: 98 F (36.7 C)    SpO2 Readings from Last 1 Encounters:  10/09/17 97%   Body mass index is 40.43 kg/m.     Physical Exam  GENERAL APPEARANCE: Alert, conversant,  No acute distress.  SKIN: No diaphoresis rash HEAD: Normocephalic, atraumatic  EYES: Conjunctiva/lids clear. Pupils round, reactive. EOMs intact.  EARS: External exam WNL, canals clear. Hearing grossly normal.  NOSE: No deformity or discharge.  MOUTH/THROAT: Lips w/o lesions  RESPIRATORY: Breathing is even, unlabored. Lung sounds are clear   CARDIOVASCULAR: Heart RRR no murmurs, rubs or gallops. No peripheral edema.   GASTROINTESTINAL: Abdomen is soft, non-tender, not distended w/ normal bowel sounds. GENITOURINARY: Bladder non tender, not distended  MUSCULOSKELETAL: No abnormal joints or musculature NEUROLOGIC:  Cranial nerves 2-12 grossly intact. Moves all extremities  PSYCHIATRIC: Mood and affect appropriate to situation, no behavioral issues   Patient Active Problem List   Diagnosis Date Noted  . Hyperosmolar hyperglycemic coma due to diabetes mellitus without ketoacidosis (HCC) 02/21/2018  . New onset type 2 diabetes mellitus (HCC) 02/21/2018  . Acute renal failure superimposed on stage 3 chronic kidney disease (HCC) 02/21/2018  . Hypernatremia 02/21/2018  . Hypokalemia 02/21/2018  . Decubitus ulcer of coccygeal region, stage 2 (HCC) 02/21/2018  . GERD (gastroesophageal reflux disease) 12/21/2017  . Allergic rhinitis 12/21/2017  . Declining functional status 08/18/2017  . Decreased functional mobility 08/18/2017  . Chronic kidney disease (CKD) 06/02/2017  . Arthritis 06/02/2017  . GI bleed 02/15/2017  . Acute blood loss anemia 02/15/2017  . Escherichia coli urinary tract infection 02/15/2017  . Hypoxia 02/15/2017  . Edema of upper extremity 02/15/2017  . Bilateral lower extremity edema 02/15/2017  . Pulmonary embolism (  HCC) 02/08/2017  . Hypertension 02/08/2017  . Acute kidney injury superimposed on chronic kidney disease (HCC) 02/08/2017  . Polyneuropathy 02/08/2017  . Depression 02/08/2017      Labs reviewed: Basic Metabolic Panel:    Component Value Date/Time   NA 142 03/03/2018   K 3.8 03/03/2018   BUN 15 03/03/2018   CREATININE 0.9 03/03/2018   AST 15 02/25/2018   ALT 8 02/25/2018   ALKPHOS 90 02/25/2018    Recent Labs    09/22/17 02/25/18 03/03/18  NA 145 144 142  K 4.5 3.2* 3.8  BUN 29* 17 15  CREATININE 0.9 1.4* 0.9   Liver Function Tests: Recent Labs    02/25/18  AST 15  ALT 8  ALKPHOS 90   No results for input(s): LIPASE, AMYLASE in the last 8760 hours. No results for input(s): AMMONIA in the last 8760 hours. CBC: Recent Labs    04/14/17 06/23/17  11/24/17 12/24/17 02/25/18  WBC 6.1 5   < > 5.2 5.8 7.5  NEUTROABS 4  --   --   --   --   --   HGB 9.0* 8.7   < > 10.8* 11.4* 10.9*  HCT 29* 28.9   < > 35* 35* 34*  MCV  --  78.6  --   --   --   --   PLT 237  --    < > 184 162 252   <  > = values in this interval not displayed.   Lipid Recent Labs    02/25/18  CHOL 140  HDL 47  LDLCALC 68  TRIG 142    Cardiac Enzymes: No results for input(s): CKTOTAL, CKMB, CKMBINDEX, TROPONINI in the last 8760 hours. BNP: No results for input(s): BNP in the last 8760 hours. No results found for: [facility] Lab Results  Component Value Date   HGBA1C 14.4 02/25/2018   Lab Results  Component Value Date   TSH 2.02 02/25/2018   No results found for: VITAMINB12 No results found for: FOLATE No results found for: IRON, TIBC, FERRITIN  Imaging and Procedures obtained prior to SNF admission: Patient was never admitted.   Not all labs, radiology exams or other studies done during hospitalization come through on my EPIC note; however they are reviewed by me.    Assessment and Plan  Euvolemia- patient is in neutral fluid balance, more to the point she has not gained fluid weight; will continue to monitor  No problem-specific Assessment & Plan notes found for this encounter.   Kristine Todd. Kristine Hollingshead, MD

## 2018-03-22 ENCOUNTER — Encounter: Payer: Self-pay | Admitting: Internal Medicine

## 2018-03-24 ENCOUNTER — Non-Acute Institutional Stay (SKILLED_NURSING_FACILITY): Payer: Medicare Other | Admitting: Internal Medicine

## 2018-03-24 ENCOUNTER — Encounter: Payer: Self-pay | Admitting: Internal Medicine

## 2018-03-24 DIAGNOSIS — K219 Gastro-esophageal reflux disease without esophagitis: Secondary | ICD-10-CM

## 2018-03-24 DIAGNOSIS — J3089 Other allergic rhinitis: Secondary | ICD-10-CM | POA: Diagnosis not present

## 2018-03-24 DIAGNOSIS — I2699 Other pulmonary embolism without acute cor pulmonale: Secondary | ICD-10-CM | POA: Diagnosis not present

## 2018-03-24 NOTE — Progress Notes (Signed)
Location:  [facility] Room Number: [phone] Place of Service:  SNF [phone])  Kristine Todd. Kristine Hollingshead, MD  Patient Care Team: Kristine Hanks, MD as PCP - General (Internal Medicine)  Extended Emergency Contact Information Primary Emergency Contact: Kristine Todd Address: [address]          [place], [place] [address] D[place] of [place] Home Phone: [phone] Relation: Son Preferred language: English Interpreter needed? No Secondary Emergency Contact: Kristine Todd Home Phone: [phone] Relation: Sister    Allergies: Patient has no known allergies.  Chief Complaint  Patient presents with  . Medical Management of Chronic Issues    Routine visit    HPI: Patient is 73 y.o. female who is being seen for routine issues of history of PE, allergic rhinitis, and GERD.  Past Medical History:  Diagnosis Date  . Arthritis   . Depression   . Hypertension   . Pulmonary embolism [facility]     Past Surgical History:  Procedure Laterality Date  . ABDOMINAL HYSTERECTOMY    . BACK SURGERY    . REPLACEMENT TOTAL KNEE BILATERAL      Allergies as of 03/24/2018   No Known Allergies     Medication List       Accurate as of March 24, 2018 11:59 PM. Always use your most recent med list.        acetaminophen 325 MG tablet Commonly known as:  TYLENOL Take 650 mg by mouth every 4 (four) hours as needed for mild pain or fever.   acetaminophen 500 MG tablet Commonly known as:  TYLENOL Take 500 mg by mouth 2 (two) times daily.   aspirin EC 81 MG tablet Take 81 mg by mouth daily.   Claritin 10 MG tablet Generic drug:  loratadine Take 10 mg by mouth daily.   ferrous sulfate 325 (65 FE) MG tablet Take 325 mg by mouth daily with breakfast.   Flonase Sensimist 27.5 MCG/SPRAY nasal spray Generic drug:  fluticasone Place 2 sprays into the nose daily. apply 2 sprays to both nostrils daily prn nasal congestion/ nose related allergy symptoms    furosemide 20 MG tablet Commonly known as:  LASIX Take 20 mg by mouth daily.   gabapentin 300 MG capsule Commonly known as:  NEURONTIN Take 600 mg by mouth 3 (three) times daily.   insulin glargine 100 UNIT/ML injection Commonly known as:  LANTUS Inject 20 Units into the skin daily.   insulin lispro 100 UNIT/ML injection Commonly known as:  HUMALOG Inject 2-12 Units into the skin every 4 (four) hours as needed for high blood sugar.   ipratropium-albuterol 0.5-2.5 (3) MG/3ML Soln Commonly known as:  DUONEB Take 3 mLs by nebulization every 6 (six) hours as needed.   metoprolol tartrate 25 MG tablet Commonly known as:  LOPRESSOR Take 12.5 mg by mouth 2 (two) times daily.   pantoprazole 40 MG tablet Commonly known as:  PROTONIX Take 40 mg by mouth daily.   polyethylene glycol packet Commonly known as:  MIRALAX / GLYCOLAX Take 17 g by mouth daily.   potassium chloride 10 MEQ tablet Commonly known as:  K-DUR,KLOR-CON Take 10 mEq by mouth daily.   Vitamin D (Ergocalciferol) 1.25 MG (50000 UT) Caps capsule Commonly known as:  DRISDOL Take 50,000 Units by mouth every 7 (seven) days. Start taking on:  July 09, 2018       No orders of the defined types were placed in this encounter.  Immunization History  Administered Date(s) Administered  . Influenza-Unspecified 09/07/2016, 09/25/2017    Social History   Tobacco Use  . Smoking status: Former Smoker    Years: 55.00    Last attempt to quit: 04/2016    Years since quitting: 1.9  . Smokeless tobacco: Never Used  Substance Use Topics  . Alcohol use: No    Frequency: Never    Review of Systems  DATA OBTAINED: from nurse GENERAL:  no fevers, fatigue, appetite changes SKIN: No itching, rash HEENT: No complaint RESPIRATORY: No cough, wheezing, SOB CARDIAC: No chest pain, palpitations, lower extremity edema  GI: No abdominal pain, No N/V/D or constipation, No heartburn or reflux  GU: No dysuria, frequency or  urgency, or incontinence  MUSCULOSKELETAL: No unrelieved bone/joint pain NEUROLOGIC: No headache, dizziness  PSYCHIATRIC: No overt anxiety or sadness  Vitals:   03/24/18 0856  BP: 134/62  Pulse: 61  Resp: 18  Temp: 98.3 F (36.8 C)   Body mass index is 40.43 kg/m. Physical Exam  GENERAL APPEARANCE: Alert, conversant, No acute distress  SKIN: No diaphoresis rash HEENT: Unremarkable RESPIRATORY: Breathing is even, unlabored. Lung sounds are clear   CARDIOVASCULAR: Heart RRR no murmurs, rubs or gallops.  Eyes peripheral edema  GASTROINTESTINAL: Abdomen is soft, non-tender, not distended w/ normal bowel sounds.  GENITOURINARY: Bladder non tender, not distended  MUSCULOSKELETAL: No abnormal joints or musculature NEUROLOGIC: Cranial nerves 2-12 grossly intact. Moves all extremities PSYCHIATRIC: Mood and affect appropriate to situation, no behavioral issues  Patient Active Problem List   Diagnosis Date Noted  . Hyperosmolar hyperglycemic coma due to diabetes mellitus without ketoacidosis (HCC) 02/21/2018  . New onset type 2 diabetes mellitus (HCC) 02/21/2018  . Acute renal failure superimposed on stage 3 chronic kidney disease (HCC) 02/21/2018  . Hypernatremia 02/21/2018  . Hypokalemia 02/21/2018  . Decubitus ulcer of coccygeal region, stage 2 (HCC) 02/21/2018  . GERD (gastroesophageal reflux disease) 12/21/2017  . Allergic rhinitis 12/21/2017  . Declining functional status 08/18/2017  . Decreased functional mobility 08/18/2017  . Chronic kidney disease (CKD) 06/02/2017  . Arthritis 06/02/2017  . GI bleed 02/15/2017  . Acute blood loss anemia 02/15/2017  . Escherichia coli urinary tract infection 02/15/2017  . Hypoxia 02/15/2017  . Edema of upper extremity 02/15/2017  . Bilateral lower extremity edema 02/15/2017  . Pulmonary embolism (HCC) 02/08/2017  . Hypertension 02/08/2017  . Acute kidney injury superimposed on chronic kidney disease (HCC) 02/08/2017  . Polyneuropathy  02/08/2017  . Depression 02/08/2017    CMP     Component Value Date/Time   NA 142 03/03/2018   K 3.8 03/03/2018   BUN 15 03/03/2018   CREATININE 0.9 03/03/2018   AST 15 02/25/2018   ALT 8 02/25/2018   ALKPHOS 90 02/25/2018   Recent Labs    09/22/17 02/25/18 03/03/18  NA 145 144 142  K 4.5 3.2* 3.8  BUN 29* 17 15  CREATININE 0.9 1.4* 0.9   Recent Labs    02/25/18  AST 15  ALT 8  ALKPHOS 90   Recent Labs    04/14/17 06/23/17  11/24/17 12/24/17 02/25/18  WBC 6.1 5   < > 5.2 5.8 7.5  NEUTROABS 4  --   --   --   --   --   HGB 9.0* 8.7   < > 10.8* 11.4* 10.9*  HCT 29* 28.9   < > 35* 35* 34*  MCV  --  78.6  --   --   --   --  PLT 237  --    < > 184 162 252   < > = values in this interval not displayed.   Recent Labs    02/25/18  CHOL 140  LDLCALC 68  TRIG 142   No results found for: [facility] Lab Results  Component Value Date   TSH 2.02 02/25/2018   Lab Results  Component Value Date   HGBA1C 14.4 02/25/2018   Lab Results  Component Value Date   CHOL 140 02/25/2018   HDL 47 02/25/2018   LDLCALC 68 02/25/2018   TRIG 142 02/25/2018    Significant Diagnostic Results in last 30 days:  No results found.  Assessment and Plan  Pulmonary embolism (HCC) Treated until May 2019; continue to monitor  Allergic rhinitis No complaints or problems; continue Flonase 2 sprays each nostril daily  GERD (gastroesophageal reflux disease) No reported problems; continue Protonix 40 mg daily    Britlyn Martine D. Kristine Hollingshead, MD

## 2018-03-29 ENCOUNTER — Encounter: Payer: Self-pay | Admitting: Internal Medicine

## 2018-03-29 NOTE — Assessment & Plan Note (Signed)
No complaints or problems; continue Flonase 2 sprays each nostril daily

## 2018-03-29 NOTE — Assessment & Plan Note (Signed)
No reported problems; continue Protonix 40 mg daily 

## 2018-03-29 NOTE — Assessment & Plan Note (Signed)
Treated until May 2019; continue to monitor

## 2018-04-28 ENCOUNTER — Encounter: Payer: Self-pay | Admitting: Internal Medicine

## 2018-04-28 ENCOUNTER — Non-Acute Institutional Stay (SKILLED_NURSING_FACILITY): Payer: Medicare Other | Admitting: Internal Medicine

## 2018-04-28 DIAGNOSIS — I10 Essential (primary) hypertension: Secondary | ICD-10-CM

## 2018-04-28 DIAGNOSIS — M1990 Unspecified osteoarthritis, unspecified site: Secondary | ICD-10-CM | POA: Diagnosis not present

## 2018-04-28 DIAGNOSIS — G629 Polyneuropathy, unspecified: Secondary | ICD-10-CM | POA: Diagnosis not present

## 2018-04-28 NOTE — Progress Notes (Signed)
Location:  [facility] Room Number: 216/W Place of Service:  SNF (31)  Kristine Hanks, MD  Patient Care Team: Kristine Hanks, MD as PCP - General (Internal Medicine)  Extended Emergency Contact Information Primary Emergency Contact: Laney Potash Address: [address]          [place], [place] [address] [place] of [place] Home Phone: [phone] Relation: Son Preferred language: English Interpreter needed? No Secondary Emergency Contact: Marcille Buffy Home Phone: [phone] Relation: Sister    Allergies: Patient has no known allergies.  Chief Complaint  Patient presents with  . Medical Management of Chronic Issues    Routine visit of medical amangement  . Best Practice Recommendations    Eye Exam, Urine Mirco Albumin,Dexa Scan, Prevnat-13    HPI: Patient is 73 y.o. female who is being seen for routine issues of hypertension, polyneuropathy, and arthritis.  Past Medical History:  Diagnosis Date  . Arthritis   . Depression   . Hypertension   . Pulmonary embolism [facility])     Past Surgical History:  Procedure Laterality Date  . ABDOMINAL HYSTERECTOMY    . BACK SURGERY    . REPLACEMENT TOTAL KNEE BILATERAL      Allergies as of 04/28/2018   No Known Allergies     Medication List       Accurate as of April 28, 2018 11:59 PM. Always use your most recent med list.        acetaminophen 325 MG tablet Commonly known as:  TYLENOL Take 650 mg by mouth every 4 (four) hours as needed for mild pain or fever.   acetaminophen 500 MG tablet Commonly known as:  TYLENOL Take 1,000 mg by mouth 2 (two) times daily.   aspirin EC 81 MG tablet Take 81 mg by mouth daily.   Claritin 10 MG tablet Generic drug:  loratadine Take 10 mg by mouth daily.   ferrous sulfate 325 (65 FE) MG tablet Take 325 mg by mouth daily with breakfast.   Flonase Allergy Relief 50 MCG/ACT nasal spray Generic drug:  fluticasone 2 Sprays by  Nasal route nightly to relieve seasonal allergy   furosemide 20 MG tablet Commonly known as:  LASIX Take 20 mg by mouth daily.   gabapentin 300 MG capsule Commonly known as:  NEURONTIN Take 600 mg by mouth 2 (two) times daily.   Glucerna Liqd Glucerna Supplement TID between meals d/t decreased appetite with risk for malnutrition and to aid in wound healing.   HumaLOG KwikPen 100 UNIT/ML KwikPen Generic drug:  insulin lispro CBG AC with SSI 70- 120 =0unit 121-150=2units,151-200=3units,201-250=5units,251-300=8 units,301-350=11units,351 -400=15 units,greater than 400 call MD and give 15 units   insulin glargine 100 UNIT/ML injection Commonly known as:  LANTUS Inject 20 Units into the skin daily.   ipratropium-albuterol 0.5-2.5 (3) MG/3ML Soln Commonly known as:  DUONEB Take 3 mLs by nebulization every 6 (six) hours.   metoprolol tartrate 25 MG tablet Commonly known as:  LOPRESSOR Take 12.5 mg by mouth 2 (two) times daily.   pantoprazole 40 MG tablet Commonly known as:  PROTONIX Take 40 mg by mouth daily.   polyethylene glycol 17 g packet Commonly known as:  MIRALAX / GLYCOLAX Take 17 g by mouth daily.   potassium chloride 10 MEQ tablet Commonly known as:  K-DUR Take 10 mEq by mouth daily.   vitamin C 500 MG tablet Commonly known as:  ASCORBIC ACID Take by mouth. 500mg  Vit C BID to aid  in wound healing.   Vitamin D (Ergocalciferol) 1.25 MG (50000 UT) Caps capsule Commonly known as:  DRISDOL Take one capsule by mouth weekly for 16 weeks       No orders of the defined types were placed in this encounter.   Immunization History  Administered Date(s) Administered  . Influenza-Unspecified 09/07/2016, 09/25/2017    Social History   Tobacco Use  . Smoking status: Former Smoker    Years: 55.00    Last attempt to quit: 04/2016    Years since quitting: 2.0  . Smokeless tobacco: Never Used  Substance Use Topics  . Alcohol use: No    Frequency: Never    Review  of Systems  DATA OBTAINED: from patient, nurse GENERAL:  no fevers, fatigue, appetite changes SKIN: No itching, rash HEENT: No complaint RESPIRATORY: No cough, wheezing, SOB CARDIAC: No chest pain, palpitations, lower extremity edema  GI: No abdominal pain, No N/V/D or constipation, No heartburn or reflux  GU: No dysuria, frequency or urgency, or incontinence  MUSCULOSKELETAL: No unrelieved bone/joint pain NEUROLOGIC: No headache, dizziness; patient is having foot pain PSYCHIATRIC: No overt anxiety or sadness  Vitals:   04/28/18 0920  BP: 118/70  Pulse: 88  Resp: 18  Temp: 97.6 F (36.4 C)  SpO2: 92%   Body mass index is 41.52 kg/m. Physical Exam  GENERAL APPEARANCE: Alert, conversant, No acute distress  SKIN: No diaphoresis rash HEENT: Unremarkable RESPIRATORY: Breathing is even, unlabored. Lung sounds are clear   CARDIOVASCULAR: Heart RRR no murmurs, rubs or gallops. No peripheral edema  GASTROINTESTINAL: Abdomen is soft, non-tender, not distended w/ normal bowel sounds.  GENITOURINARY: Bladder non tender, not distended  MUSCULOSKELETAL: No abnormal joints or musculature NEUROLOGIC: Cranial nerves 2-12 grossly intact. Moves all extremities PSYCHIATRIC: Mood and affect appropriate to situation, no behavioral issues  Patient Active Problem List   Diagnosis Date Noted  . Hyperosmolar hyperglycemic coma due to diabetes mellitus without ketoacidosis (HCC) 02/21/2018  . New onset type 2 diabetes mellitus (HCC) 02/21/2018  . Acute renal failure superimposed on stage 3 chronic kidney disease (HCC) 02/21/2018  . Hypernatremia 02/21/2018  . Hypokalemia 02/21/2018  . Decubitus ulcer of coccygeal region, stage 2 (HCC) 02/21/2018  . GERD (gastroesophageal reflux disease) 12/21/2017  . Allergic rhinitis 12/21/2017  . Declining functional status 08/18/2017  . Decreased functional mobility 08/18/2017  . Chronic kidney disease (CKD) 06/02/2017  . Arthritis 06/02/2017  . GI bleed  02/15/2017  . Acute blood loss anemia 02/15/2017  . Escherichia coli urinary tract infection 02/15/2017  . Hypoxia 02/15/2017  . Edema of upper extremity 02/15/2017  . Bilateral lower extremity edema 02/15/2017  . Pulmonary embolism (HCC) 02/08/2017  . Hypertension 02/08/2017  . Acute kidney injury superimposed on chronic kidney disease (HCC) 02/08/2017  . Polyneuropathy 02/08/2017  . Depression 02/08/2017    CMP     Component Value Date/Time   NA 142 03/03/2018   K 3.8 03/03/2018   BUN 15 03/03/2018   CREATININE 0.9 03/03/2018   AST 15 02/25/2018   ALT 8 02/25/2018   ALKPHOS 90 02/25/2018   Recent Labs    09/22/17 02/25/18 03/03/18  NA 145 144 142  K 4.5 3.2* 3.8  BUN 29* 17 15  CREATININE 0.9 1.4* 0.9   Recent Labs    02/25/18  AST 15  ALT 8  ALKPHOS 90   Recent Labs    06/23/17  11/24/17 12/24/17 02/25/18  WBC 5   < > 5.2 5.8 7.5  HGB 8.7   < >  10.8* 11.4* 10.9*  HCT 28.9   < > 35* 35* 34*  MCV 78.6  --   --   --   --   PLT  --    < > 184 162 252   < > = values in this interval not displayed.   Recent Labs    02/25/18  CHOL 140  LDLCALC 68  TRIG 142   [facility] Results  Component Value Date   TSH 2.02 02/25/2018   Lab Results  Component Value Date   HGBA1C 14.4 02/25/2018   Lab Results  Component Value Date   CHOL 140 02/25/2018   HDL 47 02/25/2018   LDLCALC 68 02/25/2018   TRIG 142 02/25/2018    Significant Diagnostic Results in last 30 days:  No results found.  Assessment and Plan  Hypertension Chronic and stable; continue Lasix 20 mg daily  Polyneuropathy Patient complains of pain in her feet; she was sent from hospital on Neurontin 300 mg twice daily which was increased to 600 twice daily and which I am now increasing to 600 3 times daily; will monitor response  Arthritis No reports of joint pain; continue Tylenol 500 mg twice daily      Kristine HanksAnne D Ashvik Grundman MD

## 2018-05-02 ENCOUNTER — Encounter: Payer: Self-pay | Admitting: Internal Medicine

## 2018-05-02 NOTE — Assessment & Plan Note (Signed)
Chronic and stable; continue Lasix 20 mg daily 

## 2018-05-02 NOTE — Assessment & Plan Note (Signed)
No reports of joint pain; continue Tylenol 500 mg twice daily

## 2018-05-02 NOTE — Assessment & Plan Note (Signed)
Patient complains of pain in her feet; she was sent from hospital on Neurontin 300 mg twice daily which was increased to 600 twice daily and which I am now increasing to 600 3 times daily; will monitor response

## 2018-05-05 LAB — LIPID PANEL
Cholesterol: 144 (ref 0–200)
HDL: 42 (ref 35–70)
LDL Cholesterol: 80
LDl/HDL Ratio: 3.5
Triglycerides: 172 — AB (ref 40–160)

## 2018-05-05 LAB — HEMOGLOBIN A1C: Hemoglobin A1C: 9.2

## 2018-05-22 ENCOUNTER — Encounter: Payer: Self-pay | Admitting: Internal Medicine

## 2018-05-22 ENCOUNTER — Non-Acute Institutional Stay (SKILLED_NURSING_FACILITY): Payer: Medicare Other | Admitting: Internal Medicine

## 2018-05-22 DIAGNOSIS — R600 Localized edema: Secondary | ICD-10-CM

## 2018-05-22 DIAGNOSIS — E119 Type 2 diabetes mellitus without complications: Secondary | ICD-10-CM

## 2018-05-22 DIAGNOSIS — F32A Depression, unspecified: Secondary | ICD-10-CM

## 2018-05-22 DIAGNOSIS — F329 Major depressive disorder, single episode, unspecified: Secondary | ICD-10-CM | POA: Diagnosis not present

## 2018-05-22 NOTE — Progress Notes (Signed)
[facility] Room Number: 216-W Place of Service:  SNF (31)  Margit HanksAlexander, Thaison Kolodziejski D, MD  Patient Care Team: Margit HanksAlexander, Hersel Mcmeen D, MD as PCP - General (Internal Medicine)  Extended Emergency Contact Information Primary Emergency Contact: Laney PotashHarris, Clarence Address: [address]          [place], [place] [address] [place] Home Phone: [phone] Relation: Son Preferred language: English Interpreter needed? No Secondary Emergency Contact: Marcille Buffyroberts, lillian Home Phone: [phone] Relation: Sister    Allergies: Patient has no known allergies.  Chief Complaint  Patient presents with  . Medical Management of Chronic Issues    Routine [facility] visit    HPI: Patient is an 73 y.o. female who is being seen for routine issues of diabetes mellitus type 2, depression, and chronic bilateral lower extremity edema.  Past Medical History:  Diagnosis Date  . Arthritis   . Depression   . Hypertension   . Pulmonary embolism [facility](HCC)     Past Surgical History:  Procedure Laterality Date  . ABDOMINAL HYSTERECTOMY    . BACK SURGERY    . REPLACEMENT TOTAL KNEE BILATERAL      Allergies as of 05/22/2018   No Known Allergies     Medication List       Accurate as of May 22, 2018 11:59 PM. If you have any questions, ask your nurse or doctor.        STOP taking these medications   Vitamin D (Ergocalciferol) 1.25 MG (50000 UT) Caps capsule Commonly known as:  DRISDOL Stopped by:  Merrilee SeashoreAnne Keidan Aumiller, MD     TAKE these medications   acetaminophen 325 MG tablet Commonly known as:  TYLENOL Take 650 mg by mouth every 6 (six) hours as needed for mild pain or fever.   acetaminophen 500 MG tablet Commonly known as:  TYLENOL Take 1,000 mg by mouth 2 (two) times daily.   aspirin EC 81 MG tablet Take 81 mg by mouth daily.   Claritin 10 MG tablet Generic drug:  loratadine Take 10 mg by mouth daily.   ferrous sulfate 325 (65 FE) MG  tablet Take 325 mg by mouth daily with breakfast.   Flonase Allergy Relief 50 MCG/ACT nasal spray Generic drug:  fluticasone 2 Sprays by Nasal route nightly to relieve seasonal allergy   furosemide 20 MG tablet Commonly known as:  LASIX Take 20 mg by mouth daily.   gabapentin 600 MG tablet Commonly known as:  NEURONTIN Take 600 mg by mouth 3 (three) times daily. What changed:  Another medication with the same name was removed. Continue taking this medication, and follow the directions you see here. Changed by:  Merrilee SeashoreAnne Alexx Giambra, MD   Glucerna Liqd Take 237 mLs by mouth 3 (three) times daily between meals.   HumaLOG KwikPen 100 UNIT/ML KwikPen Generic drug:  insulin lispro CBG AC with SSI 70- 120 =0unit 121-150=2units,151-200=3units,201-250=5units,251-300=8 units,301-350=11units,351 -400=15 units,greater than 400 call MD and give 15 units   insulin glargine 100 UNIT/ML injection Commonly known as:  LANTUS Inject 20 Units into the skin daily.   ipratropium-albuterol 0.5-2.5 (3) MG/3ML Soln Commonly known as:  DUONEB Take 3 mLs by nebulization every 6 (six) hours.   metoprolol tartrate 25 MG tablet Commonly known as:  LOPRESSOR Take 12.5 mg by mouth 2 (two) times daily. 0.5 tablet to = 12.5 mg   pantoprazole 40 MG tablet Commonly known as:  PROTONIX Take 40 mg by mouth daily.  polyethylene glycol 17 g packet Commonly known as:  MIRALAX / GLYCOLAX Take 17 g by mouth daily.   potassium chloride 10 MEQ tablet Commonly known as:  K-DUR Take 10 mEq by mouth daily.   vitamin C 500 MG tablet Commonly known as:  ASCORBIC ACID Take 500 mg by mouth 2 (two) times daily.   Vitamin D3 1.25 MG (50000 UT) Caps Take 1 capsule by mouth once a week.       No orders of the defined types were placed in this encounter.   Immunization History  Administered Date(s) Administered  . Influenza-Unspecified 09/07/2016, 09/25/2017  . Pneumococcal Polysaccharide-23 11/25/2017     Social History   Tobacco Use  . Smoking status: Former Smoker    Years: 55.00    Last attempt to quit: 04/2016    Years since quitting: 2.1  . Smokeless tobacco: Never Used  Substance Use Topics  . Alcohol use: No    Frequency: Never    Review of Systems  DATA OBTAINED: from patient GENERAL:  no fevers, fatigue, appetite changes SKIN: No itching, rash HEENT: No complaint RESPIRATORY: No cough, wheezing, SOB CARDIAC: No chest pain, palpitations, lower extremity edema  GI: No abdominal pain, No N/V/D or constipation, No heartburn or reflux  GU: No dysuria, frequency or urgency, or incontinence  MUSCULOSKELETAL: No unrelieved bone/joint pain NEUROLOGIC: No headache, dizziness  PSYCHIATRIC: No overt anxiety or sadness  Vitals:   05/22/18 1008  BP: 109/68  Pulse: 85  Resp: 18  Temp: 97.7 F (36.5 C)  SpO2: 92%   Body mass index is 41.99 kg/m. Physical Exam  GENERAL APPEARANCE: Alert, conversant, No acute distress  SKIN: No diaphoresis rash HEENT: Unremarkable RESPIRATORY: Breathing is even, unlabored. Lung sounds are clear   CARDIOVASCULAR: Heart RRR no murmurs, rubs or gallops. No peripheral edema  GASTROINTESTINAL: Abdomen is soft, non-tender, not distended w/ normal bowel sounds.  GENITOURINARY: Bladder non tender, not distended  MUSCULOSKELETAL: No abnormal joints or musculature NEUROLOGIC: Cranial nerves 2-12 grossly intact. Moves all extremities PSYCHIATRIC: Mood and affect appropriate to situation, no behavioral issues  Patient Active Problem List   Diagnosis Date Noted  . Hyperosmolar hyperglycemic coma due to diabetes mellitus without ketoacidosis (HCC) 02/21/2018  . New onset type 2 diabetes mellitus (HCC) 02/21/2018  . Acute renal failure superimposed on stage 3 chronic kidney disease (HCC) 02/21/2018  . Hypernatremia 02/21/2018  . Hypokalemia 02/21/2018  . Decubitus ulcer of coccygeal region, stage 2 (HCC) 02/21/2018  . GERD (gastroesophageal reflux  disease) 12/21/2017  . Allergic rhinitis 12/21/2017  . Declining functional status 08/18/2017  . Decreased functional mobility 08/18/2017  . Chronic kidney disease (CKD) 06/02/2017  . Arthritis 06/02/2017  . GI bleed 02/15/2017  . Acute blood loss anemia 02/15/2017  . Escherichia coli urinary tract infection 02/15/2017  . Hypoxia 02/15/2017  . Edema of upper extremity 02/15/2017  . Bilateral lower extremity edema 02/15/2017  . Pulmonary embolism (HCC) 02/08/2017  . Hypertension 02/08/2017  . Acute kidney injury superimposed on chronic kidney disease (HCC) 02/08/2017  . Polyneuropathy 02/08/2017  . Depression 02/08/2017    CMP     Component Value Date/Time   NA 142 03/03/2018   K 3.8 03/03/2018   BUN 15 03/03/2018   CREATININE 0.9 03/03/2018   AST 15 02/25/2018   ALT 8 02/25/2018   ALKPHOS 90 02/25/2018   Recent Labs    09/22/17 02/25/18 03/03/18  NA 145 144 142  K 4.5 3.2* 3.8  BUN 29* 17 15  CREATININE 0.9 1.4* 0.9   Recent Labs    02/25/18  AST 15  ALT 8  ALKPHOS 90   Recent Labs    06/23/17  11/24/17 12/24/17 02/25/18  WBC 5   < > 5.2 5.8 7.5  HGB 8.7   < > 10.8* 11.4* 10.9*  HCT 28.9   < > 35* 35* 34*  MCV 78.6  --   --   --   --   PLT  --    < > 184 162 252   < > = values in this interval not displayed.   Recent Labs    02/25/18  CHOL 140  LDLCALC 68  TRIG 142   No results found for: [facility] Lab Results  Component Value Date   TSH 2.02 02/25/2018   Lab Results  Component Value Date   HGBA1C 14.4 02/25/2018   Lab Results  Component Value Date   CHOL 140 02/25/2018   HDL 47 02/25/2018   LDLCALC 68 02/25/2018   TRIG 142 02/25/2018    Significant Diagnostic Results in last 30 days:  No results found.  Assessment and Plan  New onset type 2 diabetes mellitus (HCC) No reports of high blood sugars; it is time for a new A1c, is been 3 months; continue Lantus insulin 20 units daily and sliding scale insulin with meals; patient is not on  statin, LDL cholesterol 2 months ago was 68 on nothing, patient is not on ACE, will start lisinopril 2.5 mg daily, patient is on ASA  Depression Continues to do well without medication; continue supportive care  Bilateral lower extremity edema Mild and at baseline; continue Lasix 20 mg daily    Nora Rooke D. [name], MD

## 2018-05-23 ENCOUNTER — Encounter: Payer: Self-pay | Admitting: Internal Medicine

## 2018-05-23 NOTE — Assessment & Plan Note (Signed)
No reports of high blood sugars; it is time for a new A1c, is been 3 months; continue Lantus insulin 20 units daily and sliding scale insulin with meals; patient is not on statin, LDL cholesterol 2 months ago was 68 on nothing, patient is not on ACE, will start lisinopril 2.5 mg daily, patient is on ASA

## 2018-05-23 NOTE — Assessment & Plan Note (Signed)
Mild and at baseline; continue Lasix 20 mg daily

## 2018-05-23 NOTE — Assessment & Plan Note (Signed)
Continues to do well without medication; continue supportive care

## 2018-05-26 LAB — HEPATIC FUNCTION PANEL
ALT: 23 (ref 7–35)
AST: 18 (ref 13–35)
Alkaline Phosphatase: 101 (ref 25–125)
Bilirubin, Total: 0.2

## 2018-05-26 LAB — LIPID PANEL
Cholesterol: 156 (ref 0–200)
HDL: 47 (ref 35–70)
LDL Cholesterol: 95
LDl/HDL Ratio: 3.3
Triglycerides: 118 (ref 40–160)

## 2018-05-26 LAB — BASIC METABOLIC PANEL
BUN: 33 — AB (ref 4–21)
Creatinine: 0.9 (ref 0.5–1.1)
Glucose: 183
Potassium: 4.4 (ref 3.4–5.3)
Sodium: 144 (ref 137–147)

## 2018-05-26 LAB — HEMOGLOBIN A1C: Hemoglobin A1C: 8.7

## 2018-05-26 LAB — CBC AND DIFFERENTIAL
HCT: 34 — AB (ref 36–46)
Hemoglobin: 10.9 — AB (ref 12.0–16.0)
Neutrophils Absolute: 4
Platelets: 207 (ref 150–399)
WBC: 5.8

## 2018-05-26 LAB — TSH: TSH: 2 (ref 0.41–5.90)

## 2018-06-22 ENCOUNTER — Non-Acute Institutional Stay (SKILLED_NURSING_FACILITY): Payer: Medicare Other | Admitting: Internal Medicine

## 2018-06-22 ENCOUNTER — Encounter: Payer: Self-pay | Admitting: Internal Medicine

## 2018-06-22 DIAGNOSIS — I10 Essential (primary) hypertension: Secondary | ICD-10-CM

## 2018-06-22 DIAGNOSIS — K219 Gastro-esophageal reflux disease without esophagitis: Secondary | ICD-10-CM | POA: Diagnosis not present

## 2018-06-22 DIAGNOSIS — J3089 Other allergic rhinitis: Secondary | ICD-10-CM | POA: Diagnosis not present

## 2018-06-22 NOTE — Progress Notes (Signed)
[facility] Room Number: [id number]-W Place of Service:  SNF (31)  Margit HanksAlexander, Daysia Vandenboom D, MD  Patient Care Team: Margit HanksAlexander, Kinisha Soper D, MD as PCP - General (Internal Medicine)  Extended Emergency Contact Information Primary Emergency Contact: Laney PotashHarris, Clarence Address: [address]          [place], [place] [address] [place] Home Phone: [phone] Relation: Son Preferred language: English Interpreter needed? No Secondary Emergency Contact: Marcille Buffyroberts, lillian Home Phone: [phone] Relation: Sister    Allergies: Patient has no known allergies.  Chief Complaint  Patient presents with  . Medical Management of Chronic Issues    Routine [facility] visit    HPI: Patient is an 73 y.o. female who is being seen for routine issues of hypertension, allergic rhinitis, and GERD.  Past Medical History:  Diagnosis Date  . Arthritis   . Depression   . Hypertension   . Pulmonary embolism [facility](HCC)     Past Surgical History:  Procedure Laterality Date  . ABDOMINAL HYSTERECTOMY    . BACK SURGERY    . REPLACEMENT TOTAL KNEE BILATERAL      Allergies as of 06/22/2018   No Known Allergies     Medication List       Accurate as of June 22, 2018 11:59 PM. If you have any questions, ask your nurse or doctor.        acetaminophen 325 MG tablet Commonly known as: TYLENOL Take 650 mg by mouth every 6 (six) hours as needed for mild pain or fever.   acetaminophen 500 MG tablet Commonly known as: TYLENOL Take 1,000 mg by mouth 2 (two) times daily.   aspirin EC 81 MG tablet Take 81 mg by mouth daily.   Claritin 10 MG tablet Generic drug: loratadine Take 10 mg by mouth daily.   ferrous sulfate 325 (65 FE) MG tablet Take 325 mg by mouth daily with breakfast.   Flonase Allergy Relief 50 MCG/ACT nasal spray Generic drug: fluticasone 2 Sprays by Nasal route nightly to relieve seasonal allergy   furosemide 20 MG tablet Commonly  known as: LASIX Take 20 mg by mouth daily.   gabapentin 600 MG tablet Commonly known as: NEURONTIN Take 600 mg by mouth 3 (three) times daily.   Glucerna Liqd Take 237 mLs by mouth 3 (three) times daily between meals.   HumaLOG KwikPen 100 UNIT/ML KwikPen Generic drug: insulin lispro CBG AC with SSI 70- 120 =0unit 121-150=2units,151-200=3units,201-250=5units,251-300=8 units,301-350=11units,351 -400=15 units,greater than 400 call MD and give 15 units   insulin glargine 100 UNIT/ML injection Commonly known as: LANTUS Inject 20 Units into the skin daily.   ipratropium-albuterol 0.5-2.5 (3) MG/3ML Soln Commonly known as: DUONEB Take 3 mLs by nebulization every 6 (six) hours.   lisinopril 2.5 MG tablet Commonly known as: ZESTRIL Take 2.5 mg by mouth daily.   metoprolol tartrate 25 MG tablet Commonly known as: LOPRESSOR Take 12.5 mg by mouth 2 (two) times daily. 0.5 tablet to = 12.5 mg   pantoprazole 40 MG tablet Commonly known as: PROTONIX Take 40 mg by mouth daily.   polyethylene glycol 17 g packet Commonly known as: MIRALAX / GLYCOLAX Take 17 g by mouth daily.   potassium chloride 10 MEQ tablet Commonly known as: K-DUR Take 10 mEq by mouth daily.   vitamin C 500 MG tablet Commonly known as: ASCORBIC ACID Take 500 mg by mouth 2 (two) times daily.   Vitamin D3 1.25 MG (50000 UT) Caps  Take 1 capsule by mouth once a week.       No orders of the defined types were placed in this encounter.   Immunization History  Administered Date(s) Administered  . Influenza-Unspecified 09/07/2016, 09/25/2017  . Pneumococcal Polysaccharide-23 11/25/2017    Social History   Tobacco Use  . Smoking status: Former Smoker    Years: 55.00    Quit date: 04/2016    Years since quitting: 2.2  . Smokeless tobacco: Never Used  Substance Use Topics  . Alcohol use: No    Frequency: Never    Review of Systems  DATA OBTAINED: from patient GENERAL:  no fevers, fatigue, appetite  changes SKIN: No itching, rash HEENT: No complaint RESPIRATORY: No cough, wheezing, SOB CARDIAC: No chest pain, palpitations, lower extremity edema  GI: No abdominal pain, No N/V/D or constipation, No heartburn or reflux  GU: No dysuria, frequency or urgency, or incontinence  MUSCULOSKELETAL: No unrelieved bone/joint pain NEUROLOGIC: No headache, dizziness; feet still hurt PSYCHIATRIC: No overt anxiety or sadness  Vitals:   06/22/18 1324  BP: 105/71  Pulse: 76  Resp: 20  Temp: (!) 97.4 F (36.3 C)  SpO2: 97%   Body mass index is 42.18 kg/m. Physical Exam  GENERAL APPEARANCE: Alert, conversant, No acute distress  SKIN: No diaphoresis rash HEENT: Unremarkable RESPIRATORY: Breathing is even, unlabored. Lung sounds are clear   CARDIOVASCULAR: Heart RRR no murmurs, rubs or gallops. No peripheral edema  GASTROINTESTINAL: Abdomen is soft, non-tender, not distended w/ normal bowel sounds.  GENITOURINARY: Bladder non tender, not distended  MUSCULOSKELETAL: No abnormal joints or musculature NEUROLOGIC: Cranial nerves 2-12 grossly intact. Moves all extremities PSYCHIATRIC: Mood and affect appropriate to situation, no behavioral issues  Patient Active Problem List   Diagnosis Date Noted  . Hyperosmolar hyperglycemic coma due to diabetes mellitus without ketoacidosis ([place]) 02/21/2018  . New onset type 2 diabetes mellitus ([place]) 02/21/2018  . Acute renal failure superimposed on stage 3 chronic kidney disease ([place]) 02/21/2018  . Hypernatremia 02/21/2018  . Hypokalemia 02/21/2018  . Decubitus ulcer of coccygeal region, stage 2 ([place]) 02/21/2018  . GERD (gastroesophageal reflux disease) 12/21/2017  . Allergic rhinitis 12/21/2017  . Declining functional status 08/18/2017  . Decreased functional mobility 08/18/2017  . Chronic kidney disease (CKD) 06/02/2017  . Arthritis 06/02/2017  . GI bleed 02/15/2017  . Acute blood loss anemia 02/15/2017  . Escherichia coli urinary tract infection  02/15/2017  . Hypoxia 02/15/2017  . Edema of upper extremity 02/15/2017  . Bilateral lower extremity edema 02/15/2017  . Pulmonary embolism ([place]) 02/08/2017  . Hypertension 02/08/2017  . Acute kidney injury superimposed on chronic kidney disease ([place]) 02/08/2017  . Polyneuropathy 02/08/2017  . Depression 02/08/2017    CMP     Component Value Date/Time   NA 142 03/03/2018   K 3.8 03/03/2018   BUN 15 03/03/2018   CREATININE 0.9 03/03/2018   AST 15 02/25/2018   ALT 8 02/25/2018   ALKPHOS 90 02/25/2018   Recent Labs    09/22/17 02/25/18 03/03/18  NA 145 144 142  K 4.5 3.2* 3.8  BUN 29* 17 15  CREATININE 0.9 1.4* 0.9   Recent Labs    02/25/18  AST 15  ALT 8  ALKPHOS 90   Recent Labs    11/24/17 12/24/17 02/25/18  WBC 5.2 5.8 7.5  HGB 10.8* 11.4* 10.9*  HCT 35* 35* 34*  PLT 184 162 252   Recent Labs    02/25/18  CHOL 140  LDLCALC 68  TRIG 142   No results found for: [facility] Lab Results  Component Value Date   TSH 2.02 02/25/2018   Lab Results  Component Value Date   HGBA1C 14.4 02/25/2018   Lab Results  Component Value Date   CHOL 140 02/25/2018   HDL 47 02/25/2018   LDLCALC 68 02/25/2018   TRIG 142 02/25/2018    Significant Diagnostic Results in last 30 days:  No results found.  Assessment and Plan  Hypertension Stable; continue Lasix 20 mg daily  Allergic rhinitis No reported complaints; continue Flonase 2 sprays each nostril daily  GERD (gastroesophageal reflux disease) No reported problems; continue Protonix 40 mg daily    Margit HanksAnne D Willman Cuny, MD

## 2018-06-23 ENCOUNTER — Encounter: Payer: Self-pay | Admitting: Internal Medicine

## 2018-06-23 LAB — VITAMIN D 25 HYDROXY (VIT D DEFICIENCY, FRACTURES): Vit D, 25-Hydroxy: 28.19

## 2018-06-23 NOTE — Assessment & Plan Note (Signed)
No reported problems; continue Protonix 40 mg daily 

## 2018-06-23 NOTE — Assessment & Plan Note (Signed)
Stable; continue Lasix 20 mg daily 

## 2018-06-23 NOTE — Assessment & Plan Note (Signed)
No reported complaints; continue Flonase 2 sprays each nostril daily

## 2018-07-15 ENCOUNTER — Non-Acute Institutional Stay (SKILLED_NURSING_FACILITY): Payer: Medicare Other | Admitting: Internal Medicine

## 2018-07-15 DIAGNOSIS — R5081 Fever presenting with conditions classified elsewhere: Secondary | ICD-10-CM | POA: Diagnosis not present

## 2018-07-15 DIAGNOSIS — R05 Cough: Secondary | ICD-10-CM | POA: Diagnosis not present

## 2018-07-15 DIAGNOSIS — R059 Cough, unspecified: Secondary | ICD-10-CM

## 2018-07-15 LAB — BASIC METABOLIC PANEL
BUN: 27 — AB (ref 4–21)
Creatinine: 1.1 (ref 0.5–1.1)
Glucose: 211
Potassium: 4.6 (ref 3.4–5.3)
Sodium: 142 (ref 137–147)

## 2018-07-15 LAB — HEPATIC FUNCTION PANEL
ALT: 15 (ref 7–35)
AST: 19 (ref 13–35)
Alkaline Phosphatase: 80 (ref 25–125)
Bilirubin, Total: 0.3

## 2018-07-15 LAB — CBC AND DIFFERENTIAL
HCT: 35 — AB (ref 36–46)
Hemoglobin: 11.3 — AB (ref 12.0–16.0)
Neutrophils Absolute: 2
Platelets: 167 (ref 150–399)
WBC: 3.5

## 2018-07-20 ENCOUNTER — Encounter: Payer: Self-pay | Admitting: Internal Medicine

## 2018-07-20 NOTE — Progress Notes (Signed)
[facility] Room Number: 511-P Place of Service:  SNF (31)  Margit HanksAlexander, Akeema Broder D, MD  Patient Care Team: Margit HanksAlexander, Ebonie Westerlund D, MD as PCP - General (Internal Medicine)  Extended Emergency Contact Information Primary Emergency Contact: Laney PotashHarris, Clarence Address: [address]          [place], [place] [address] [place] Home Phone: [phone] Relation: Son Preferred language: English Interpreter needed? No Secondary Emergency Contact: Marcille Buffyroberts, lillian Home Phone: [phone] Relation: Sister    Allergies: Patient has no known allergies.  Chief Complaint  Patient presents with  . Acute Visit    Patient is seen for fever and dry cough, was placed in isolation.    HPI: Patient is a 73 y.o. female who is being seen because she had a temperature last night and a dry cough is noted today.  I hear coarse rales bilaterally lower lobes.  Patient has no edema.  Patient denies chest pain or shortness of breath.  Past Medical History:  Diagnosis Date  . Arthritis   . Depression   . Hypertension   . Pulmonary embolism [facility](HCC)     Past Surgical History:  Procedure Laterality Date  . ABDOMINAL HYSTERECTOMY    . BACK SURGERY    . REPLACEMENT TOTAL KNEE BILATERAL      Allergies as of 07/15/2018   No Known Allergies     Medication List       Accurate as of July 15, 2018 11:59 PM. If you have any questions, ask your nurse or doctor.        acetaminophen 325 MG tablet Commonly known as: TYLENOL Take 650 mg by mouth every 6 (six) hours as needed for mild pain or fever.   acetaminophen 500 MG tablet Commonly known as: TYLENOL Take 1,000 mg by mouth 2 (two) times daily.   aspirin EC 81 MG tablet Take 81 mg by mouth daily.   Claritin 10 MG tablet Generic drug: loratadine Take 10 mg by mouth daily.   DULoxetine 30 MG capsule Commonly known as: CYMBALTA Take 30 mg by mouth daily.   ferrous sulfate 325 (65 FE) MG tablet  Take 325 mg by mouth daily with breakfast.   Flonase Allergy Relief 50 MCG/ACT nasal spray Generic drug: fluticasone 2 Sprays by Nasal route nightly to relieve seasonal allergy   furosemide 20 MG tablet Commonly known as: LASIX Take 20 mg by mouth daily.   gabapentin 600 MG tablet Commonly known as: NEURONTIN Take 600 mg by mouth 3 (three) times daily.   Glucerna Liqd Take 237 mLs by mouth 3 (three) times daily between meals.   HumaLOG KwikPen 100 UNIT/ML KwikPen Generic drug: insulin lispro CBG AC with SSI 70- 120 =0unit 121-150=2units,151-200=3units,201-250=5units,251-300=8 units,301-350=11units,351 -400=15 units,greater than 400 call MD and give 15 units   insulin glargine 100 UNIT/ML injection Commonly known as: LANTUS Inject 20 Units into the skin daily.   ipratropium-albuterol 0.5-2.5 (3) MG/3ML Soln Commonly known as: DUONEB Take 3 mLs by nebulization every 6 (six) hours.   lisinopril 2.5 MG tablet Commonly known as: ZESTRIL Take 2.5 mg by mouth daily.   metoprolol tartrate 25 MG tablet Commonly known as: LOPRESSOR Take 12.5 mg by mouth 2 (two) times daily. 0.5 tablet to = 12.5 mg   pantoprazole 40 MG tablet Commonly known as: PROTONIX Take 40 mg by mouth daily.   polyethylene glycol 17 g packet Commonly known as: MIRALAX / GLYCOLAX Take 17 g by mouth daily.  potassium chloride 10 MEQ tablet Commonly known as: K-DUR Take 10 mEq by mouth daily.   promethazine 25 MG tablet Commonly known as: PHENERGAN Take 25 mg by mouth every 6 (six) hours as needed for nausea or vomiting.   vitamin C 500 MG tablet Commonly known as: ASCORBIC ACID Take 500 mg by mouth 2 (two) times daily.       No orders of the defined types were placed in this encounter.   Immunization History  Administered Date(s) Administered  . Influenza-Unspecified 09/07/2016, 09/25/2017  . Pneumococcal Polysaccharide-23 11/25/2017    Social History   Tobacco Use  . Smoking status:  Former Smoker    Years: 55.00    Quit date: 04/2016    Years since quitting: 2.2  . Smokeless tobacco: Never Used  Substance Use Topics  . Alcohol use: No    Frequency: Never    Review of Systems  DATA OBTAINED: from patient, nurse GENERAL:  + fevers, no fatigue, appetite changes SKIN: No itching, rash HEENT: No complaint RESPIRATORY: + cough, no wheezing, no SOB CARDIAC: No chest pain, palpitations, lower extremity edema  GI: No abdominal pain, No N/V/D or constipation, No heartburn or reflux  GU: No dysuria, frequency or urgency, or incontinence  MUSCULOSKELETAL: No unrelieved bone/joint pain NEUROLOGIC: No headache, dizziness  PSYCHIATRIC: No overt anxiety or sadness  Vitals:   07/20/18 1518  BP: 128/79  Pulse: 78  Resp: 20  Temp: (!) 100.5 F (38.1 C)  SpO2: 96%   Body mass index is 42.18 kg/m. Physical Exam  GENERAL APPEARANCE: Alert, conversant, No acute distress  SKIN: No diaphoresis rash HEENT: Unremarkable RESPIRATORY: Breathing is even, unlabored. Lung sounds are Rales bilateral bases CARDIOVASCULAR: Heart RRR no murmurs, rubs or gallops. No peripheral edema  GASTROINTESTINAL: Abdomen is soft, non-tender, not distended w/ normal bowel sounds.  GENITOURINARY: Bladder non tender, not distended  MUSCULOSKELETAL: No abnormal joints or musculature NEUROLOGIC: Cranial nerves 2-12 grossly intact. Moves all extremities PSYCHIATRIC: Mood and affect appropriate to situation, no behavioral issues  Patient Active Problem List   Diagnosis Date Noted  . Hyperosmolar hyperglycemic coma due to diabetes mellitus without ketoacidosis ([place]) 02/21/2018  . New onset type 2 diabetes mellitus ([place]) 02/21/2018  . Acute renal failure superimposed on stage 3 chronic kidney disease ([place]) 02/21/2018  . Hypernatremia 02/21/2018  . Hypokalemia 02/21/2018  . Decubitus ulcer of coccygeal region, stage 2 ([place]) 02/21/2018  . GERD (gastroesophageal reflux disease) 12/21/2017  .  Allergic rhinitis 12/21/2017  . Declining functional status 08/18/2017  . Decreased functional mobility 08/18/2017  . Chronic kidney disease (CKD) 06/02/2017  . Arthritis 06/02/2017  . GI bleed 02/15/2017  . Acute blood loss anemia 02/15/2017  . Escherichia coli urinary tract infection 02/15/2017  . Hypoxia 02/15/2017  . Edema of upper extremity 02/15/2017  . Bilateral lower extremity edema 02/15/2017  . Pulmonary embolism ([place]) 02/08/2017  . Hypertension 02/08/2017  . Acute kidney injury superimposed on chronic kidney disease ([place]) 02/08/2017  . Polyneuropathy 02/08/2017  . Depression 02/08/2017    CMP     Component Value Date/Time   NA 142 03/03/2018   K 3.8 03/03/2018   BUN 15 03/03/2018   CREATININE 0.9 03/03/2018   AST 15 02/25/2018   ALT 8 02/25/2018   ALKPHOS 90 02/25/2018   Recent Labs    09/22/17 02/25/18 03/03/18  NA 145 144 142  K 4.5 3.2* 3.8  BUN 29* 17 15  CREATININE 0.9 1.4* 0.9   Recent Labs  02/25/18  AST 15  ALT 8  ALKPHOS 90   Recent Labs    11/24/17 12/24/17 02/25/18  WBC 5.2 5.8 7.5  HGB 10.8* 11.4* 10.9*  HCT 35* 35* 34*  PLT 184 162 252   Recent Labs    02/25/18  CHOL 140  LDLCALC 68  TRIG 142   No results found for: [facility] Lab Results  Component Value Date   TSH 2.02 02/25/2018   Lab Results  Component Value Date   HGBA1C 14.4 02/25/2018   Lab Results  Component Value Date   CHOL 140 02/25/2018   HDL 47 02/25/2018   LDLCALC 68 02/25/2018   TRIG 142 02/25/2018    Significant Diagnostic Results in last 30 days:  No results found.  Assessment and Plan  Fever/cough- patient's chest x-ray was negative awaiting blood work, patient is being moved to the isolation unit as we are living in a COVID-19 world; patient is being started on the "COVID cocktail" which includes vitamin C at thousand milligrams daily for 14 days, vitamin D 50,000 units weekly for 2 weeks, zinc 220 mg daily and ASA 81 mg daily; continue to  monitor   Time spent greater than 35 minutes;> 50% of time with patient was spent reviewing records, labs, tests and studies, counseling and developing plan of care  Margit HanksAnne D Kailena Lubas, MD

## 2018-07-24 ENCOUNTER — Encounter: Payer: Self-pay | Admitting: Internal Medicine

## 2018-07-24 ENCOUNTER — Non-Acute Institutional Stay (SKILLED_NURSING_FACILITY): Payer: Medicare Other | Admitting: Internal Medicine

## 2018-07-24 DIAGNOSIS — U071 COVID-19: Secondary | ICD-10-CM

## 2018-07-29 ENCOUNTER — Encounter: Payer: Self-pay | Admitting: Internal Medicine

## 2018-07-29 DIAGNOSIS — U071 COVID-19: Secondary | ICD-10-CM | POA: Insufficient documentation

## 2018-07-29 NOTE — Assessment & Plan Note (Signed)
Patient has no symptoms as per history of present illness; patient is in isolation she will be for 14 days and 2- tests; will monitor closely

## 2018-07-29 NOTE — Progress Notes (Signed)
[facility]   Place of Service:   SNF  Lyn HollingsheadAlexander, Randon Goldsmith D, MD  Patient Care Team: Margit HanksAlexander,  D, MD as PCP - General (Internal Medicine)  Extended Emergency Contact Information Primary Emergency Contact: Laney PotashHarris, Clarence Address: [address]          [place], [place] [address] [place] Home Phone: [phone] Relation: Son Preferred language: English Interpreter needed? No Secondary Emergency Contact: Marcille Buffyroberts, lillian Home Phone: [phone] Relation: Sister    Allergies: Patient has no known allergies.  Chief Complaint  Patient presents with  . Acute Visit    HPI: Patient is 73 y.o. female who is being seen because she is newly COVID positive.  Patient is asymptomatic.  She has had no fever, chills, nausea, vomiting, diarrhea, cough, chest pain, shortness of breath, sore throat, change in taste or smell or any other symptom associated with COVID-19.  Past Medical History:  Diagnosis Date  . Arthritis   . Depression   . Hypertension   . Pulmonary embolism [facility](HCC)     Past Surgical History:  Procedure Laterality Date  . ABDOMINAL HYSTERECTOMY    . BACK SURGERY    . REPLACEMENT TOTAL KNEE BILATERAL      Allergies as of 07/24/2018   No Known Allergies     Medication List       Accurate as of July 24, 2018 11:59 PM. If you have any questions, ask your nurse or doctor.        acetaminophen 325 MG tablet Commonly known as: TYLENOL Take 650 mg by mouth every 6 (six) hours as needed for mild pain or fever.   acetaminophen 500 MG tablet Commonly known as: TYLENOL Take 1,000 mg by mouth 2 (two) times daily.   aspirin EC 81 MG tablet Take 81 mg by mouth daily.   Claritin 10 MG tablet Generic drug: loratadine Take 10 mg by mouth daily.   DULoxetine 30 MG capsule Commonly known as: CYMBALTA Take 30 mg by mouth daily.   ferrous sulfate 325 (65 FE) MG tablet Take 325 mg by mouth daily with breakfast.   Flonase Allergy  Relief 50 MCG/ACT nasal spray Generic drug: fluticasone 2 Sprays by Nasal route nightly to relieve seasonal allergy   furosemide 20 MG tablet Commonly known as: LASIX Take 20 mg by mouth daily.   gabapentin 600 MG tablet Commonly known as: NEURONTIN Take 600 mg by mouth 3 (three) times daily.   Glucerna Liqd Take 237 mLs by mouth 3 (three) times daily between meals.   HumaLOG KwikPen 100 UNIT/ML KwikPen Generic drug: insulin lispro CBG AC with SSI 70- 120 =0unit 121-150=2units,151-200=3units,201-250=5units,251-300=8 units,301-350=11units,351 -400=15 units,greater than 400 call MD and give 15 units   insulin glargine 100 UNIT/ML injection Commonly known as: LANTUS Inject 20 Units into the skin daily.   ipratropium-albuterol 0.5-2.5 (3) MG/3ML Soln Commonly known as: DUONEB Take 3 mLs by nebulization every 6 (six) hours.   lisinopril 2.5 MG tablet Commonly known as: ZESTRIL Take 2.5 mg by mouth daily.   metoprolol tartrate 25 MG tablet Commonly known as: LOPRESSOR Take 12.5 mg by mouth 2 (two) times daily. 0.5 tablet to = 12.5 mg   pantoprazole 40 MG tablet Commonly known as: PROTONIX Take 40 mg by mouth daily.   polyethylene glycol 17 g packet Commonly known as: MIRALAX / GLYCOLAX Take 17 g by mouth daily.   potassium chloride 10 MEQ tablet Commonly known as: K-DUR Take 10 mEq by mouth daily.  promethazine 25 MG tablet Commonly known as: PHENERGAN Take 25 mg by mouth every 6 (six) hours as needed for nausea or vomiting.   vitamin C 500 MG tablet Commonly known as: ASCORBIC ACID Take 500 mg by mouth 2 (two) times daily.       No orders of the defined types were placed in this encounter.   Immunization History  Administered Date(s) Administered  . Influenza-Unspecified 09/07/2016, 09/25/2017  . Pneumococcal Polysaccharide-23 11/25/2017    Social History   Tobacco Use  . Smoking status: Former Smoker    Years: 55.00    Quit date: 04/2016    Years  since quitting: 2.3  . Smokeless tobacco: Never Used  Substance Use Topics  . Alcohol use: No    Frequency: Never    Review of Systems  DATA OBTAINED: from patient, nurse GENERAL:  no fevers, fatigue, appetite changes SKIN: No itching, rash HEENT: No complaint RESPIRATORY: No cough, wheezing, SOB CARDIAC: No chest pain, palpitations, lower extremity edema  GI: No abdominal pain, No N/V/D or constipation, No heartburn or reflux  GU: No dysuria, frequency or urgency, or incontinence  MUSCULOSKELETAL: No unrelieved bone/joint pain NEUROLOGIC: No headache, dizziness  PSYCHIATRIC: No overt anxiety or sadness  Vitals:   07/29/18 1423  BP: 123/75  Pulse: 78  Resp: 18  Temp: 98 F (36.7 C)   There is no height or weight on file to calculate BMI. Physical Exam  GENERAL APPEARANCE: Alert, conversant, No acute distress  SKIN: No diaphoresis rash HEENT: Unremarkable RESPIRATORY: Breathing is even, unlabored. Lung sounds are clear   CARDIOVASCULAR: Heart RRR no murmurs, rubs or gallops. No peripheral edema  GASTROINTESTINAL: Abdomen is soft, non-tender, not distended w/ normal bowel sounds.  GENITOURINARY: Bladder non tender, not distended  MUSCULOSKELETAL: No abnormal joints or musculature NEUROLOGIC: Cranial nerves 2-12 grossly intact. Moves all extremities PSYCHIATRIC: Mood and affect appropriate to situation, no behavioral issues  Patient Active Problem List   Diagnosis Date Noted  . COVID-19 virus infection 07/29/2018  . Hyperosmolar hyperglycemic coma due to diabetes mellitus without ketoacidosis ([place]) 02/21/2018  . New onset type 2 diabetes mellitus ([place]) 02/21/2018  . Acute renal failure superimposed on stage 3 chronic kidney disease ([place]) 02/21/2018  . Hypernatremia 02/21/2018  . Hypokalemia 02/21/2018  . Decubitus ulcer of coccygeal region, stage 2 ([employer]) 02/21/2018  . GERD (gastroesophageal reflux disease) 12/21/2017  . Allergic rhinitis 12/21/2017  . Declining  functional status 08/18/2017  . Decreased functional mobility 08/18/2017  . Chronic kidney disease (CKD) 06/02/2017  . Arthritis 06/02/2017  . GI bleed 02/15/2017  . Acute blood loss anemia 02/15/2017  . Escherichia coli urinary tract infection 02/15/2017  . Hypoxia 02/15/2017  . Edema of upper extremity 02/15/2017  . Bilateral lower extremity edema 02/15/2017  . Pulmonary embolism ([place]) 02/08/2017  . Hypertension 02/08/2017  . Acute kidney injury superimposed on chronic kidney disease ([place]) 02/08/2017  . Polyneuropathy 02/08/2017  . Depression 02/08/2017    CMP     Component Value Date/Time   NA 142 03/03/2018   K 3.8 03/03/2018   BUN 15 03/03/2018   CREATININE 0.9 03/03/2018   AST 15 02/25/2018   ALT 8 02/25/2018   ALKPHOS 90 02/25/2018   Recent Labs    09/22/17 02/25/18 03/03/18  NA 145 144 142  K 4.5 3.2* 3.8  BUN 29* 17 15  CREATININE 0.9 1.4* 0.9   Recent Labs    02/25/18  AST 15  ALT 8  ALKPHOS 90  Recent Labs    11/24/17 12/24/17 02/25/18  WBC 5.2 5.8 7.5  HGB 10.8* 11.4* 10.9*  HCT 35* 35* 34*  PLT 184 162 252   Recent Labs    02/25/18  CHOL 140  LDLCALC 68  TRIG 142   No results found for: [facility] Lab Results  Component Value Date   TSH 2.02 02/25/2018   Lab Results  Component Value Date   HGBA1C 14.4 02/25/2018   Lab Results  Component Value Date   CHOL 140 02/25/2018   HDL 47 02/25/2018   LDLCALC 68 02/25/2018   TRIG 142 02/25/2018    Significant Diagnostic Results in last 30 days:  No results found.  Assessment and Plan  COVID-19 virus infection Patient has no symptoms as per history of present illness; patient is in isolation she will be for 14 days and 2- tests; will monitor closely    Margit HanksAnne D , MD

## 2018-08-06 LAB — NOVEL CORONAVIRUS, NAA: SARS-CoV-2, NAA: POSITIVE

## 2018-08-07 ENCOUNTER — Non-Acute Institutional Stay (SKILLED_NURSING_FACILITY): Payer: Medicare Other | Admitting: Internal Medicine

## 2018-08-07 ENCOUNTER — Encounter: Payer: Self-pay | Admitting: Internal Medicine

## 2018-08-07 DIAGNOSIS — M1990 Unspecified osteoarthritis, unspecified site: Secondary | ICD-10-CM | POA: Diagnosis not present

## 2018-08-07 DIAGNOSIS — G629 Polyneuropathy, unspecified: Secondary | ICD-10-CM

## 2018-08-07 DIAGNOSIS — R600 Localized edema: Secondary | ICD-10-CM | POA: Diagnosis not present

## 2018-08-07 NOTE — Progress Notes (Signed)
Location:  [place] Room Number: 511-P Place of Service:  SNF (31)  Hennie Duos, MD  Patient Care Team: Hennie Duos, MD as PCP - General (Internal Medicine)  Extended Emergency Contact Information Primary Emergency Contact: Marshall Cork Address: [address]          [place], [place] [address] [place]te Litter of [place] Phone: [phone] Relation: Son Preferred language: English Interpreter needed? No Secondary Emergency Contact: Oren Binet Home Phone: [phone] Relation: Sister    Allergies: Patient has no known allergies.  Chief Complaint  Patient presents with  . Medical Management of Chronic Issues    Routine [facility] visit    HPI: Patient is an 73 y.o. female who has just been released from COVID isolation, she is been asymptomatic her entire stay, and is being seen today for routine issues of polyneuropathy, arthritis, and bilateral lower extremity edema.  Past Medical History:  Diagnosis Date  . Arthritis   . Depression   . Hypertension   . Pulmonary embolism [facility])     Past Surgical History:  Procedure Laterality Date  . ABDOMINAL HYSTERECTOMY    . BACK SURGERY    . REPLACEMENT TOTAL KNEE BILATERAL      Allergies as of 08/07/2018   No Known Allergies     Medication List       Accurate as of August 07, 2018 11:59 PM. If you have any questions, ask your nurse or doctor.        acetaminophen 325 MG tablet Commonly known as: TYLENOL Take 650 mg by mouth every 6 (six) hours as needed for mild pain or fever.   acetaminophen 500 MG tablet Commonly known as: TYLENOL Take 1,000 mg by mouth 2 (two) times daily.   albuterol 108 (90 Base) MCG/ACT inhaler Commonly known as: VENTOLIN HFA Inhale 2 puffs into the lungs 3 (three) times daily.   aspirin EC 81 MG tablet Take 81 mg by mouth daily.   Claritin 10 MG tablet Generic drug: loratadine Take 10 mg by mouth daily.   DULoxetine  30 MG capsule Commonly known as: CYMBALTA Take 30 mg by mouth daily.   ferrous sulfate 325 (65 FE) MG tablet Take 325 mg by mouth daily with breakfast.   Flonase Allergy Relief 50 MCG/ACT nasal spray Generic drug: fluticasone 2 Sprays by Nasal route nightly to relieve seasonal allergy   furosemide 20 MG tablet Commonly known as: LASIX Take 20 mg by mouth daily.   gabapentin 600 MG tablet Commonly known as: NEURONTIN Take 600 mg by mouth 3 (three) times daily.   Glucerna Liqd Take 237 mLs by mouth 3 (three) times daily between meals.   HumaLOG KwikPen 100 UNIT/ML KwikPen Generic drug: insulin lispro CBG AC with SSI 70- 120 =0unit 121-150=2units,151-200=3units,201-250=5units,251-300=8 units,301-350=11units,351 -400=15 units,greater than 400 call MD and give 15 units   insulin glargine 100 UNIT/ML injection Commonly known as: LANTUS Inject 20 Units into the skin daily.   lisinopril 2.5 MG tablet Commonly known as: ZESTRIL Take 2.5 mg by mouth daily.   metoprolol tartrate 25 MG tablet Commonly known as: LOPRESSOR Take 12.5 mg by mouth 2 (two) times daily. 0.5 tablet to = 12.5 mg   pantoprazole 40 MG tablet Commonly known as: PROTONIX Take 40 mg by mouth daily.   polyethylene glycol 17 g packet Commonly known as: MIRALAX / GLYCOLAX Take 17 g by mouth daily.   potassium chloride 10 MEQ tablet Commonly known as:  K-DUR Take 10 mEq by mouth daily.   promethazine 25 MG tablet Commonly known as: PHENERGAN Take 25 mg by mouth every 6 (six) hours as needed for nausea or vomiting.   Vitamin D3 1.25 MG (50000 UT) Caps Take 1 capsule by mouth once a week.       No orders of the defined types were placed in this encounter.   Immunization History  Administered Date(s) Administered  . Influenza-Unspecified 09/07/2016, 09/25/2017  . Pneumococcal Polysaccharide-23 11/25/2017    Social History   Tobacco Use  . Smoking status: Former Smoker    Years: 55.00    Quit  date: 04/2016    Years since quitting: 2.3  . Smokeless tobacco: Never Used  Substance Use Topics  . Alcohol use: No    Frequency: Never    Review of Systems  DATA OBTAINED: from patient, nurse GENERAL:  no fevers, fatigue, appetite changes SKIN: No itching, rash HEENT: No complaint RESPIRATORY: No cough, wheezing, SOB CARDIAC: No chest pain, palpitations, lower extremity edema  GI: No abdominal pain, No N/V/D or constipation, No heartburn or reflux  GU: No dysuria, frequency or urgency, or incontinence  MUSCULOSKELETAL: No unrelieved bone/joint pain NEUROLOGIC: No headache, dizziness  PSYCHIATRIC: No overt anxiety or sadness  Vitals:   08/07/18 1125  BP: 122/72  Pulse: 88  Resp: 19  Temp: 98.7 F (37.1 C)  SpO2: 97%   Body mass index is 42.18 kg/m. Physical Exam  GENERAL APPEARANCE: Alert, conversant, No acute distress  SKIN: No diaphoresis rash HEENT: Unremarkable RESPIRATORY: Breathing is even, unlabored. Lung sounds are clear   CARDIOVASCULAR: Heart RRR no murmurs, rubs or gallops. No peripheral edema  GASTROINTESTINAL: Abdomen is soft, non-tender, not distended w/ normal bowel sounds.  GENITOURINARY: Bladder non tender, not distended  MUSCULOSKELETAL: No abnormal joints or musculature NEUROLOGIC: Cranial nerves 2-12 grossly intact. Moves all extremities PSYCHIATRIC: Mood and affect appropriate to situation with some dementia, no behavioral issues  Patient Active Problem List   Diagnosis Date Noted  . COVID-19 virus infection 07/29/2018  . Hyperosmolar hyperglycemic coma due to diabetes mellitus without ketoacidosis (HCC) 02/21/2018  . New onset type 2 diabetes mellitus (HCC) 02/21/2018  . Acute renal failure superimposed on stage 3 chronic kidney disease (HCC) 02/21/2018  . Hypernatremia 02/21/2018  . Hypokalemia 02/21/2018  . Decubitus ulcer of coccygeal region, stage 2 (HCC) 02/21/2018  . GERD (gastroesophageal reflux disease) 12/21/2017  . Allergic  rhinitis 12/21/2017  . Declining functional status 08/18/2017  . Decreased functional mobility 08/18/2017  . Chronic kidney disease (CKD) 06/02/2017  . Arthritis 06/02/2017  . GI bleed 02/15/2017  . Acute blood loss anemia 02/15/2017  . Escherichia coli urinary tract infection 02/15/2017  . Hypoxia 02/15/2017  . Edema of upper extremity 02/15/2017  . Bilateral lower extremity edema 02/15/2017  . Pulmonary embolism (HCC) 02/08/2017  . Hypertension 02/08/2017  . Acute kidney injury superimposed on chronic kidney disease (HCC) 02/08/2017  . Polyneuropathy 02/08/2017  . Depression 02/08/2017    CMP     Component Value Date/Time   NA 144 05/26/2018   K 4.4 05/26/2018   BUN 33 (A) 05/26/2018   CREATININE 0.9 05/26/2018   AST 18 05/26/2018   ALT 23 05/26/2018   ALKPHOS 101 05/26/2018   Recent Labs    02/25/18 03/03/18 05/26/18  NA 144 142 144  K 3.2* 3.8 4.4  BUN 17 15 33*  CREATININE 1.4* 0.9 0.9   Recent Labs    02/25/18 05/26/18  AST 15  18  ALT 8 23  ALKPHOS 90 101   Recent Labs    12/24/17 02/25/18 05/26/18  WBC 5.8 7.5 5.8  NEUTROABS  --   --  4  HGB 11.4* 10.9* 10.9*  HCT 35* 34* 34*  PLT 162 252 207   Recent Labs    02/25/18 05/05/18 05/26/18  CHOL 140 144 156  LDLCALC 68 80 95  TRIG 142 172* 118   No results found for: [facility] Lab Results  Component Value Date   TSH 2.00 05/26/2018   Lab Results  Component Value Date   HGBA1C 8.7 05/26/2018   Lab Results  Component Value Date   CHOL 156 05/26/2018   HDL 47 05/26/2018   LDLCALC 95 05/26/2018   TRIG 118 05/26/2018    Significant Diagnostic Results in last 30 days:  No results found.  Assessment and Plan  Polyneuropathy Is not being controlled by Neurontin 600 mg 3 times daily so have started Cymbalta 30 mg daily in addition have not heard any complaints; continue current regimen.  Arthritis No reports of pain; continue Tylenol 500 mg twice daily  Bilateral lower extremity edema  Continues to be mild and at baseline; continue Lasix 20 mg daily     Margit HanksAnne D Norva Bowe, MD

## 2018-08-08 ENCOUNTER — Encounter: Payer: Self-pay | Admitting: Internal Medicine

## 2018-08-08 NOTE — Assessment & Plan Note (Signed)
Is not being controlled by Neurontin 600 mg 3 times daily so have started Cymbalta 30 mg daily in addition have not heard any complaints; continue current regimen.

## 2018-08-08 NOTE — Assessment & Plan Note (Signed)
Continues to be mild and at baseline; continue Lasix 20 mg daily

## 2018-08-08 NOTE — Assessment & Plan Note (Signed)
No reports of pain; continue Tylenol 500 mg twice daily

## 2018-08-31 ENCOUNTER — Non-Acute Institutional Stay (SKILLED_NURSING_FACILITY): Payer: Medicare Other | Admitting: Internal Medicine

## 2018-08-31 ENCOUNTER — Encounter: Payer: Self-pay | Admitting: Internal Medicine

## 2018-08-31 DIAGNOSIS — G629 Polyneuropathy, unspecified: Secondary | ICD-10-CM

## 2018-08-31 DIAGNOSIS — R600 Localized edema: Secondary | ICD-10-CM

## 2018-08-31 DIAGNOSIS — E119 Type 2 diabetes mellitus without complications: Secondary | ICD-10-CM | POA: Diagnosis not present

## 2018-08-31 DIAGNOSIS — U071 COVID-19: Secondary | ICD-10-CM

## 2018-08-31 DIAGNOSIS — I10 Essential (primary) hypertension: Secondary | ICD-10-CM | POA: Diagnosis not present

## 2018-08-31 NOTE — Progress Notes (Signed)
Location:  [facility] Room Number: 02/18/18 Place of Service:  SNF (31) Provider:  Edmon CrapeLassen, Marlaine Arey, PA-C   Margit HanksAlexander, Anne D, MD  Patient Care Team: Margit HanksAlexander, Anne D, MD as PCP - General (Internal Medicine)  Extended Emergency Contact Information Primary Emergency Contact: Laney PotashHarris, Clarence Address: [address]          [place], [place] [id number] [place] Home Phone: [phone] Relation: Son Preferred language: English Interpreter needed? No Secondary Emergency Contact: Marcille Buffyroberts, lillian Home Phone: [phone] Relation: Sister  Code Status:  DNR Goals of care: Advanced Directive information Advanced Directives 08/07/2018  Does Patient Have a Medical Advance Directive? Yes  Type of Advance Directive Out of facility DNR (pink MOST or yellow form)  Does patient want to make changes to medical advance directive? No - Patient declined  Would patient like information on creating a medical advance directive? -  Pre-existing out of facility DNR order (yellow form or pink MOST form) Yellow form placed in chart (order not valid for inpatient use)  L --Chief complaint-routine visit for medical management of chronic medical conditions including polyneuropathy- osteoarthritis-lower extremity edema- hypertension- type 2 diabetes-GERD-anemia-depression   HPI:  Pt is an 73 y.o. female seen today for medical management       Of chronic conditions as noted above  Patient does not have any complaints today and nursing does not report any concerns.  He has completed a course of isolation secondary to a positive COVID-19 test- but essentially remained asymptomatic.  Currently she is lying in bed comfortably vital signs are stable.  In regards to type 2 diabetes she is on Lantus 20 units daily as well as her Humalog sliding scale with meals  Blood sugars in the morning appear to be more in the 100s with more variability later in the day  ranging from the mid 100s to 300s.  She also has a history of polyneuropathy and Cymbalta was recently added to her Neurontin to help with pain she says this is having some beneficial effect.  She does have a history of hypertension she is on Lasix 20 mg daily as well as lisinopril 2.5 mg a day and Lopressor 12.5 mg twice daily this appears to be stable-recent blood pressures 129/73-134/66-117/68.  She also has a history of anemia she is on iron hemoglobin s/p stability it was 11.3 on lab done last month.  She also has lower extremity edema which appears to be fairly stable on Lasix 20 mg a day.        Past Medical History:  Diagnosis Date  . Arthritis   . Depression   . Hypertension   . Pulmonary embolism [facility](HCC)    Past Surgical History:  Procedure Laterality Date  . ABDOMINAL HYSTERECTOMY    . BACK SURGERY    . REPLACEMENT TOTAL KNEE BILATERAL      No Known Allergies  Outpatient Encounter Medications as of 08/31/2018  Medication Sig  . acetaminophen (TYLENOL) 325 MG tablet Take 650 mg by mouth every 6 (six) hours as needed for mild pain or fever.   Marland Kitchen. acetaminophen (TYLENOL) 500 MG tablet Take 1,000 mg by mouth 2 (two) times daily.   Marland Kitchen. albuterol (VENTOLIN HFA) 108 (90 Base) MCG/ACT inhaler Inhale 2 puffs into the lungs 3 (three) times daily.  Marland Kitchen. aspirin EC 81 MG tablet Take 81 mg by mouth daily.  . Cholecalciferol (VITAMIN D3) 1.25 MG (50000 UT) CAPS Take 1 capsule by mouth once  a week.  . DULoxetine (CYMBALTA) 30 MG capsule Take 30 mg by mouth daily.  . ferrous sulfate 325 (65 FE) MG tablet Take 325 mg by mouth daily with breakfast.  . fluticasone (FLONASE ALLERGY RELIEF) 50 MCG/ACT nasal spray 2 Sprays by Nasal route nightly to relieve seasonal allergy  . furosemide (LASIX) 20 MG tablet Take 20 mg by mouth daily.  Marland Kitchen gabapentin (NEURONTIN) 600 MG tablet Take 600 mg by mouth 3 (three) times daily.  . Glucerna (GLUCERNA) LIQD Take 237 mLs by mouth 3 (three) times daily  between meals.   . insulin glargine (LANTUS) 100 UNIT/ML injection Inject 20 Units into the skin daily.   . insulin lispro (HUMALOG KWIKPEN) 100 UNIT/ML KwikPen CBG AC with SSI 70- 120 =0unit 121-150=2units,151-200=3units,201-250=5units,251-300=8 units,301-350=11units,351 -400=15 units,greater than 400 call MD and give 15 units  . lisinopril (ZESTRIL) 2.5 MG tablet Take 2.5 mg by mouth daily.  Marland Kitchen loratadine (CLARITIN) 10 MG tablet Take 10 mg by mouth daily.   . metoprolol tartrate (LOPRESSOR) 25 MG tablet Take 12.5 mg by mouth 2 (two) times daily. 0.5 tablet to = 12.5 mg  . pantoprazole (PROTONIX) 40 MG tablet Take 40 mg by mouth daily.  . polyethylene glycol (MIRALAX / GLYCOLAX) packet Take 17 g by mouth daily.  . potassium chloride (K-DUR,KLOR-CON) 10 MEQ tablet Take 10 mEq by mouth daily.  . promethazine (PHENERGAN) 25 MG tablet Take 25 mg by mouth every 6 (six) hours as needed for nausea or vomiting.   No facility-administered encounter medications on file as of 08/31/2018.     Review of Systems  In general she is not complaining of any fever or chills.  Skin does not complain of rashes or itching.  Head ears eyes nose mouth and throat is not complaining of visual changes or sore throat.  Respiratory does not complain of shortness of breath or cough.  Cardiac is not complaining of chest pain has chronic lower extremity edema which appears stable.  GI is not complaining of abdominal pain nausea vomiting diarrhea constipation says she is having regular bowel movements and does not have trouble urinating.  GU as noted above does not complain of dysuria.  Musculoskeletal does have lower extremity weakness at baseline but does not really complain of pain.  Neurologic does have a history of neuropathy she says the Cymbalta as well as the Neurontin combination appears to help Does not complain of headache dizziness or syncope.  Psych does have a listed history of depression but appears  to be in good spirits does not complain of being overtly depressed or anxious  Immunization History  Administered Date(s) Administered  . Influenza-Unspecified 09/07/2016, 09/25/2017  . Pneumococcal Polysaccharide-23 11/25/2017   Pertinent  Health Maintenance Due  Topic Date Due  . DEXA SCAN  07/06/2001  . INFLUENZA VACCINE  08/08/2018  . OPHTHALMOLOGY EXAM  11/24/2019 (Originally 1945/11/06)  . FOOT EXAM  10/25/2018  . HEMOGLOBIN A1C  11/26/2018  . PNA vac Low Risk Adult (2 of 2 - PCV13) 11/26/2018   Fall Risk  11/21/2017  Falls in the past year? 0  Number falls in past yr: 0  Injury with Fall? 0   Functional Status Survey:    Vitals:   08/31/18 0854  BP: 129/73  Pulse: 78  Resp: 18  Temp: (!) 97 F (36.1 C)  TempSrc: Oral  SpO2: 96%  Weight: 215 lb 6.4 oz (97.7 kg)  Height: 5' (1.524 m)   Body mass index is 42.07 kg/m. Physical  Exam   In general this is a pleasant elderly female no distress lying comfortably in bed.  Skin is warm and dry.  Eyes visual acuity appears grossly intact sclera and conjunctive are clear.  Oropharynx is clear mucous membranes moist she has numerous extractions.  Chest is clear to auscultation with somewhat shallow air entry there is no labored breathing she is wearing oxygen.  Heart is regular rate and rhythm without murmur gallop or rub it appears she has relatively baseline lower extremity edema which is controlled.  Abdomen is somewhat obese soft nontender with positive bowel sounds.  Musculoskeletal moves all her extremities with baseline strength-he does have fairly extensive lower extremity weakness does have movement of her lower legs but significantly weaker than her upper extremities- she does have a well-healed surgical scar right knee.  Neurologic as noted above positive for weakness cannot really appreciate lateralizing findings cranial nerves appear grossly intact her speech is clear.  Psych she continues to be pleasant  and appropriate  Labs reviewed: Recent Labs    03/03/18 05/26/18 07/15/18  NA 142 144 142  K 3.8 4.4 4.6  BUN 15 33* 27*  CREATININE 0.9 0.9 1.1   Recent Labs    02/25/18 05/26/18 07/15/18  AST 15 18 19   ALT 8 23 15   ALKPHOS 90 101 80   Recent Labs    02/25/18 05/26/18 07/15/18  WBC 7.5 5.8 3.5  NEUTROABS  --  4 2  HGB 10.9* 10.9* 11.3*  HCT 34* 34* 35*  PLT 252 207 167   Lab Results  Component Value Date   TSH 2.00 05/26/2018   Lab Results  Component Value Date   HGBA1C 8.7 05/26/2018   Lab Results  Component Value Date   CHOL 156 05/26/2018   HDL 47 05/26/2018   LDLCALC 95 05/26/2018   TRIG 118 05/26/2018    Significant Diagnostic Results in last 30 days:  No results found.  Assessment/Plan  #1 history of diabetes type 2-as noted above she has somewhat variable blood sugars later in the day morning sugars appear to be largely in the 100s-hemoglobin A1c actually was 8.7 back in May will have this updated.  She continues on Lantus 20 units a day as well as Humalog sliding scale which is fairly aggressive with meals-again will see what the hemoglobin A1c tells us.  2.-  History of polyneuropathy apparently the Cymbalta 30 mg a day is helping in addition to Neurontin 600 mg 3 times daily.  3.  History of osteoarthritis continues on Tylenol thousand milligrams twice daily apparently this is helping control the pain and discomfort.  4.  History of hypertension this appears stable as noted above on low-dose lisinopril 2.5 mg a day in addition to Lopressor 12.5 mg twice daily and Lasix 20 mg a day.  5.  History of anemia hemoglobin controlled stability at 11.3 she is on iron.  6.  History of pulmonary embolism in the past she has completed therapy for this until May 2019 she does continue on aspirin.  7.  History of GERD this appears stable on Protonix.  8.  History of allergic rhinitis appears relatively asymptomatic Flonase.  9.  History of vitamin D  deficiency and she is on supplementation through course.  Offline insulation continues to be stable with regard last level was 28.19 back in June.  #10 history of COVID-19 positive-again she remains essentially asymptomatic--he has completed a course of andisolation  has returned to her room  --[phone]

## 2018-09-01 LAB — HEMOGLOBIN A1C: Hemoglobin A1C: 8.6

## 2018-09-29 ENCOUNTER — Encounter: Payer: Self-pay | Admitting: Internal Medicine

## 2018-09-29 ENCOUNTER — Non-Acute Institutional Stay (SKILLED_NURSING_FACILITY): Payer: Medicare Other | Admitting: Internal Medicine

## 2018-09-29 DIAGNOSIS — I10 Essential (primary) hypertension: Secondary | ICD-10-CM

## 2018-09-29 DIAGNOSIS — N183 Chronic kidney disease, stage 3 unspecified: Secondary | ICD-10-CM

## 2018-09-29 DIAGNOSIS — K219 Gastro-esophageal reflux disease without esophagitis: Secondary | ICD-10-CM | POA: Diagnosis not present

## 2018-09-29 NOTE — Progress Notes (Signed)
Location:  [facility] Room Number: 216-W Place of Service:  SNF (31)  [name], MD  Patient Care Team: [name], MD as PCP - General (Internal Medicine)  Extended Emergency Contact Information Primary Emergency Contact: Laney Potash Address: [address]          [place], [place] [address] [place] Home Phone: [phone] Relation: Son Preferred language: English Interpreter needed? No Secondary Emergency Contact: Marcille Buffy Home Phone: [phone] Relation: Sister    Allergies: Patient has no known allergies.  Chief Complaint  Patient presents with  . Medical Management of Chronic Issues    Routine [facility] visit    HPI: Patient is an 73 y.o. female who is being seen for routine issues of hypertension, GERD, and chronic kidney disease stage III.  Past Medical History:  Diagnosis Date  . Arthritis   . Depression   . Hypertension   . Pulmonary embolism [facility])     Past Surgical History:  Procedure Laterality Date  . ABDOMINAL HYSTERECTOMY    . BACK SURGERY    . REPLACEMENT TOTAL KNEE BILATERAL      Allergies as of 09/29/2018   No Known Allergies     Medication List       Accurate as of September 29, 2018 11:59 PM. If you have any questions, ask your nurse or doctor.        acetaminophen 325 MG tablet Commonly known as: TYLENOL Take 650 mg by mouth every 6 (six) hours as needed for mild pain or fever.   acetaminophen 500 MG tablet Commonly known as: TYLENOL Take 1,000 mg by mouth 2 (two) times daily.   albuterol 108 (90 Base) MCG/ACT inhaler Commonly known as: VENTOLIN HFA Inhale 2 puffs into the lungs 3 (three) times daily.   aspirin EC 81 MG tablet Take 81 mg by mouth daily.   Claritin 10 MG tablet Generic drug: loratadine Take 10 mg by mouth daily.   DULoxetine 30 MG capsule Commonly known as: CYMBALTA Take 30 mg by mouth daily.   ferrous sulfate 325 (65  FE) MG tablet Take 325 mg by mouth daily with breakfast.   Flonase Allergy Relief 50 MCG/ACT nasal spray Generic drug: fluticasone 2 Sprays by Nasal route nightly to relieve seasonal allergy   furosemide 20 MG tablet Commonly known as: LASIX Take 20 mg by mouth daily.   gabapentin 600 MG tablet Commonly known as: NEURONTIN Take 600 mg by mouth 3 (three) times daily.   Glucerna Liqd Take 237 mLs by mouth 2 (two) times daily between meals.   HumaLOG KwikPen 100 UNIT/ML KwikPen Generic drug: insulin lispro CBG AC with SSI 70- 120 =0unit 121-150=2units,151-200=3units,201-250=5units,251-300=8 units,301-350=11units,351 -400=15 units,greater than 400 call MD and give 15 units   insulin glargine 100 UNIT/ML injection Commonly known as: LANTUS Inject 20 Units into the skin daily.   lisinopril 2.5 MG tablet Commonly known as: ZESTRIL Take 2.5 mg by mouth daily.   metoprolol tartrate 25 MG tablet Commonly known as: LOPRESSOR Take 12.5 mg by mouth 2 (two) times daily. 0.5 tablet to = 12.5 mg   pantoprazole 40 MG tablet Commonly known as: PROTONIX Take 40 mg by mouth daily.   polyethylene glycol 17 g packet Commonly known as: MIRALAX / GLYCOLAX Take 17 g by mouth daily.   potassium chloride 10 MEQ tablet Commonly known as: K-DUR Take 10 mEq by mouth daily.   promethazine 25 MG tablet Commonly known  as: PHENERGAN Take 25 mg by mouth every 6 (six) hours as needed for nausea or vomiting.   Vitamin D3 1.25 MG (50000 UT) Caps Take 1 capsule by mouth once a week.       No orders of the defined types were placed in this encounter.   Immunization History  Administered Date(s) Administered  . Influenza-Unspecified 09/07/2016, 09/25/2017  . Pneumococcal Polysaccharide-23 11/25/2017    Social History   Tobacco Use  . Smoking status: Former Smoker    Years: 55.00    Quit date: 04/2016    Years since quitting: 2.4  . Smokeless tobacco: Never Used  Substance Use Topics   . Alcohol use: No    Frequency: Never    Review of Systems  DATA OBTAINED: from nurse GENERAL:  no fevers, fatigue, appetite changes SKIN: No itching, rash HEENT: No complaint RESPIRATORY: No cough, wheezing, SOB CARDIAC: No chest pain, palpitations, lower extremity edema  GI: No abdominal pain, No N/V/D or constipation, No heartburn or reflux  GU: No dysuria, frequency or urgency, or incontinence  MUSCULOSKELETAL: No unrelieved bone/joint pain NEUROLOGIC: No headache, dizziness  PSYCHIATRIC: No overt anxiety or sadness  Vitals:   09/29/18 1024  BP: 127/79  Pulse: 71  Resp: 18  Temp: 98.7 F (37.1 C)   Body mass index is 42.89 kg/m. Physical Exam  GENERAL APPEARANCE: Alert, conversant, No acute distress  SKIN: No diaphoresis rash HEENT: Unremarkable RESPIRATORY: Breathing is even, unlabored. Lung sounds are clear   CARDIOVASCULAR: Heart RRR no murmurs, rubs or gallops. No peripheral edema  GASTROINTESTINAL: Abdomen is soft, non-tender, not distended w/ normal bowel sounds.  GENITOURINARY: Bladder non tender, not distended  MUSCULOSKELETAL: No abnormal joints or musculature NEUROLOGIC: Cranial nerves 2-12 grossly intact. Moves all extremities PSYCHIATRIC: Mood and affect appropriate to situation, no behavioral issues  Patient Active Problem List   Diagnosis Date Noted  . COVID-19 virus infection 07/29/2018  . Hyperosmolar hyperglycemic coma due to diabetes mellitus without ketoacidosis ([place]) 02/21/2018  . New onset type 2 diabetes mellitus ([place]) 02/21/2018  . Acute renal failure superimposed on stage 3 chronic kidney disease ([place]) 02/21/2018  . Hypernatremia 02/21/2018  . Hypokalemia 02/21/2018  . Decubitus ulcer of coccygeal region, stage 2 ([place]) 02/21/2018  . GERD (gastroesophageal reflux disease) 12/21/2017  . Declining functional status 08/18/2017  . Decreased functional mobility 08/18/2017  . Chronic kidney disease (CKD) 06/02/2017  . Arthritis 06/02/2017   . GI bleed 02/15/2017  . Acute blood loss anemia 02/15/2017  . Escherichia coli urinary tract infection 02/15/2017  . Hypoxia 02/15/2017  . Edema of upper extremity 02/15/2017  . Bilateral lower extremity edema 02/15/2017  . Pulmonary embolism ([place]) 02/08/2017  . Hypertension 02/08/2017  . Acute kidney injury superimposed on chronic kidney disease ([place]) 02/08/2017  . Polyneuropathy 02/08/2017  . Depression 02/08/2017    CMP     Component Value Date/Time   NA 142 07/15/2018   K 4.6 07/15/2018   BUN 27 (A) 07/15/2018   CREATININE 1.1 07/15/2018   AST 19 07/15/2018   ALT 15 07/15/2018   ALKPHOS 80 07/15/2018   Recent Labs    03/03/18 05/26/18 07/15/18  NA 142 144 142  K 3.8 4.4 4.6  BUN 15 33* 27*  CREATININE 0.9 0.9 1.1   Recent Labs    02/25/18 05/26/18 07/15/18  AST 15 18 19   ALT 8 23 15   ALKPHOS 90 101 80   Recent Labs    02/25/18 05/26/18 07/15/18  WBC 7.5 5.8  3.5  NEUTROABS  --  4 2  HGB 10.9* 10.9* 11.3*  HCT 34* 34* 35*  PLT 252 207 167   Recent Labs    02/25/18 05/05/18 05/26/18  CHOL 140 144 156  LDLCALC 68 80 95  TRIG 142 172* 118   No results found for: [facility] Lab Results  Component Value Date   TSH 2.00 05/26/2018   Lab Results  Component Value Date   HGBA1C 8.7 05/26/2018   Lab Results  Component Value Date   CHOL 156 05/26/2018   HDL 47 05/26/2018   LDLCALC 95 05/26/2018   TRIG 118 05/26/2018    Significant Diagnostic Results in last 30 days:  No results found.  Assessment and Plan  Hypertension Chronic and stable; continue Lasix 20 mg daily  GERD (gastroesophageal reflux disease) Continues without problem; continue Protonix 40 mg daily=  Chronic kidney disease (CKD) Most recent creatinine is 1.1 which is stable from prior; will monitor intervals    [name], MD

## 2018-10-02 ENCOUNTER — Encounter: Payer: Self-pay | Admitting: Internal Medicine

## 2018-10-02 NOTE — Assessment & Plan Note (Signed)
Most recent creatinine is 1.1 which is stable from prior; will monitor intervals

## 2018-10-02 NOTE — Assessment & Plan Note (Signed)
Continues without problem; continue Protonix 40 mg daily=

## 2018-10-02 NOTE — Assessment & Plan Note (Signed)
Chronic and stable; continue Lasix 20 mg daily 

## 2018-10-06 LAB — BASIC METABOLIC PANEL
BUN: 31 — AB (ref 4–21)
BUN: 31 — AB (ref 4–21)
CO2: 32 — AB (ref 13–22)
CO2: 32 — AB (ref 13–22)
Chloride: 96 — AB (ref 99–108)
Chloride: 96 — AB (ref 99–108)
Creatinine: 1.1 (ref 0.5–1.1)
Creatinine: 1.1 (ref 0.5–1.1)
Glucose: 173
Glucose: 173
Potassium: 4.5 (ref 3.4–5.3)
Potassium: 4.5 (ref 3.4–5.3)
Sodium: 141 (ref 137–147)
Sodium: 141 (ref 137–147)

## 2018-10-06 LAB — CBC
RBC: 4.36 (ref 3.87–5.11)
RBC: 4.36 (ref 3.87–5.11)

## 2018-10-06 LAB — COMPREHENSIVE METABOLIC PANEL
Albumin: 4.1 (ref 3.5–5.0)
Albumin: 4.1 (ref 3.5–5.0)
Calcium: 9.6 (ref 8.7–10.7)
Calcium: 9.6 (ref 8.7–10.7)
GFR calc Af Amer: 56.07
GFR calc Af Amer: 56.07
GFR calc non Af Amer: 48.38
GFR calc non Af Amer: 48.38
Globulin: 2.6
Globulin: 2.6

## 2018-10-06 LAB — CBC AND DIFFERENTIAL
HCT: 36 (ref 36–46)
HCT: 36 (ref 36–46)
Hemoglobin: 11 — AB (ref 12.0–16.0)
Hemoglobin: 11 — AB (ref 12.0–16.0)
Neutrophils Absolute: 4
Neutrophils Absolute: 4
Platelets: 193 (ref 150–399)
Platelets: 193 (ref 150–399)
WBC: 6.6
WBC: 6.6

## 2018-10-06 LAB — HEPATIC FUNCTION PANEL
ALT: 19 (ref 7–35)
ALT: 19 (ref 7–35)
AST: 14 (ref 13–35)
AST: 14 (ref 13–35)
Alkaline Phosphatase: 86 (ref 25–125)
Alkaline Phosphatase: 86 (ref 25–125)
Bilirubin, Total: 0.2
Bilirubin, Total: 0.2

## 2018-10-06 LAB — TSH
TSH: 2.23 (ref 0.41–5.90)
TSH: 2.23 (ref 0.41–5.90)

## 2018-10-06 LAB — LIPID PANEL
Cholesterol: 155 (ref 0–200)
Cholesterol: 155 (ref 0–200)
HDL: 48 (ref 35–70)
HDL: 48 (ref 35–70)
LDL Cholesterol: 81
LDL Cholesterol: 81
LDl/HDL Ratio: 3.2
LDl/HDL Ratio: 3.2
Triglycerides: 131 (ref 40–160)
Triglycerides: 131 (ref 40–160)

## 2018-10-06 LAB — VITAMIN B12: Vitamin B-12: 42.08

## 2018-10-06 LAB — HEMOGLOBIN A1C
Hemoglobin A1C: 9
Hemoglobin A1C: 9

## 2018-10-08 ENCOUNTER — Non-Acute Institutional Stay (SKILLED_NURSING_FACILITY): Payer: Medicare Other | Admitting: Internal Medicine

## 2018-10-08 ENCOUNTER — Encounter: Payer: Self-pay | Admitting: Internal Medicine

## 2018-10-08 DIAGNOSIS — E119 Type 2 diabetes mellitus without complications: Secondary | ICD-10-CM

## 2018-10-08 DIAGNOSIS — G629 Polyneuropathy, unspecified: Secondary | ICD-10-CM | POA: Diagnosis not present

## 2018-10-08 DIAGNOSIS — N183 Chronic kidney disease, stage 3 unspecified: Secondary | ICD-10-CM

## 2018-10-08 NOTE — Progress Notes (Signed)
Location:  [facility] Room Number: 216-W Place of Service:  SNF [phone]) Provider:  Henreitta Leber, MD  Patient Care Team: Hennie Duos, MD as PCP - General (Internal Medicine)  Extended Emergency Contact Information Primary Emergency Contact: Marshall Cork Address: [address].          [place], [place] [address] Johnnette Litter of [place] Phone: [phone] Relation: Son Preferred language: English Interpreter needed? No Secondary Emergency Contact: Oren Binet Home Phone: [phone] Relation: Sister  Code Status:  DNR Goals of care: Advanced Directive information Advanced Directives 09/30/2018  Does Patient Have a Medical Advance Directive? -  Type of Advance Directive Out of facility DNR (pink MOST or yellow form)  Does patient want to make changes to medical advance directive? No - Patient declined  Would patient like information on creating a medical advance directive? -  Pre-existing out of facility DNR order (yellow form or pink MOST form) Yellow form placed in chart (order not valid for inpatient use)     Chief Complaint  Patient presents with  . Acute Visit    Elevated blood sugars    HPI:  Pt is an 73 y.o. female seen today for an acute visit for elevated blood sugars. Patient has a history of type 2 diabetes in addition to polyneuropathy as well as osteoarthritis lower extremity edema GERD anemia and depression.  She does not really have any complaints today but nursing staff is noted at times her blood sugars will run high more so later in the day.  Earlier today before noon her blood sugar actually was over 500 she did receive sliding scale 15 units of Humalog and eventually it did come down to the 200s.  She is on Lantus 20 units a day in addition to the Humalog sliding scale which begins with a blood sugar of 121 and starts at 2 units and goes up to 15 units for blood sugar greater than 351.  Per review of  blood sugars it appears her morning sugars are more in the mid 100s to 200 range but later in the day rise anywhere from the higher 200s to 300s occasionally in the 400s and rarely above 500.  The challenging issue is that it appears at times her blood sugar does drop significantly overnight so we are hesitant to be real aggressive for concerns that it may cause hypoglycemia.  Her hemoglobin A1c was 9.0 on lab on September 29 previously was 8.7 it appears this is been her relative baseline.  Vital signs are stable nursing does not report any concerns and she does not really complain of any acute issues either.  Recently she did complain of increased neuropathic pain and  her Cymbalta has been increased to 60 mg a day which appears to be helping she is also on Neurontin 600 mg 3 times daily       Past Medical History:  Diagnosis Date  . Arthritis   . Depression   . Hypertension   . Pulmonary embolism [facility])    Past Surgical History:  Procedure Laterality Date  . ABDOMINAL HYSTERECTOMY    . BACK SURGERY    . REPLACEMENT TOTAL KNEE BILATERAL      No Known Allergies  Outpatient Encounter Medications as of 10/08/2018  Medication Sig  . acetaminophen (TYLENOL) 325 MG tablet Take 650 mg by mouth every 6 (six) hours as needed for mild pain or fever.   Marland Kitchen acetaminophen (TYLENOL) 500 MG  tablet Take 1,000 mg by mouth 2 (two) times daily.   Marland Kitchen. albuterol (VENTOLIN HFA) 108 (90 Base) MCG/ACT inhaler Inhale 2 puffs into the lungs 3 (three) times daily.  Marland Kitchen. aspirin EC 81 MG tablet Take 81 mg by mouth daily.  . Cholecalciferol (VITAMIN D3) 1.25 MG (50000 UT) CAPS Take 1 capsule by mouth once a week.  . DULoxetine (CYMBALTA) 60 MG capsule Take 60 mg by mouth daily.  . ferrous sulfate 325 (65 FE) MG tablet Take 325 mg by mouth daily with breakfast.  . fluticasone (FLONASE ALLERGY RELIEF) 50 MCG/ACT nasal spray 2 Sprays by Nasal route nightly to relieve seasonal allergy  . furosemide (LASIX) 20 MG  tablet Take 20 mg by mouth daily.  Marland Kitchen. gabapentin (NEURONTIN) 600 MG tablet Take 600 mg by mouth 3 (three) times daily.  . Glucerna (GLUCERNA) LIQD Take 237 mLs by mouth 2 (two) times daily between meals.   . insulin glargine (LANTUS) 100 UNIT/ML injection Inject 20 Units into the skin daily.   . insulin lispro (HUMALOG KWIKPEN) 100 UNIT/ML KwikPen CBG AC with SSI 70- 120 =0unit 121-150=2units,151-200=3units,201-250=5units,251-300=8 units,301-350=11units,351 -400=15 units,greater than 400 call MD and give 15 units  . lisinopril (ZESTRIL) 2.5 MG tablet Take 2.5 mg by mouth daily.  Marland Kitchen. loratadine (CLARITIN) 10 MG tablet Take 10 mg by mouth daily.   . metoprolol tartrate (LOPRESSOR) 25 MG tablet Take 12.5 mg by mouth 2 (two) times daily. 0.5 tablet to = 12.5 mg  . pantoprazole (PROTONIX) 40 MG tablet Take 40 mg by mouth daily.  . polyethylene glycol (MIRALAX / GLYCOLAX) packet Take 17 g by mouth daily.  . potassium chloride (K-DUR,KLOR-CON) 10 MEQ tablet Take 10 mEq by mouth daily.  . promethazine (PHENERGAN) 25 MG tablet Take 25 mg by mouth every 6 (six) hours as needed for nausea or vomiting.  . [DISCONTINUED] DULoxetine (CYMBALTA) 30 MG capsule Take 30 mg by mouth daily.   No facility-administered encounter medications on file as of 10/08/2018.     Review of Systems   General she is not complaining of any fever or chills.  Skin is not complain of rashes or itching.  Or any diaphoresis  Head ears eyes nose mouth and throat is not complaining of any visual changes or sore throat.  Respiratory is not complaining of cough or shortness of breath.  Cardiac does not complain of chest pain or palpitations.  GI not complaining of abdominal discomfort nausea vomiting diarrhea constipation.  GU does not complain of dysuria.  Musculoskeletal does not currently complain of joint pain does have baseline lower extremity weakness.  Neurologic does not complain of dizziness headache or syncope.  And  psych does not complain of being depressed or anxious she does have a listed history of depression but appears to be in relatively good spirits  Immunization History  Administered Date(s) Administered  . Influenza-Unspecified 09/07/2016, 09/25/2017  . Pneumococcal Polysaccharide-23 11/25/2017   Pertinent  Health Maintenance Due  Topic Date Due  . INFLUENZA VACCINE  08/08/2018  . DEXA SCAN  10/08/2019 (Originally 07/06/2001)  . OPHTHALMOLOGY EXAM  11/24/2019 (Originally [birth year])  . FOOT EXAM  10/25/2018  . HEMOGLOBIN A1C  11/26/2018  . PNA vac Low Risk Adult (2 of 2 - PCV13) 11/26/2018   Fall Risk  11/21/2017  Falls in the past year? 0  Number falls in past yr: 0  Injury with Fall? 0   Functional Status Survey:    Vitals:   10/08/18 1350  BP: 121/80  Pulse: 84  Resp: 18  Temp: 98.8 F (37.1 C)  TempSrc: Oral  SpO2: 97%  Weight: 219 lb 9.6 oz (99.6 kg)  Height: 5' (1.524 m)   Body mass index is 42.89 kg/m. Physical Exam   In general this is a pleasant elderly female in no distress lying comfortably in bed.  Her skin is warm and dry.  Eyes visual acuity appears to be intact sclera and conjunctive are clear.  Oropharynx clear mucous membranes moist.  Chest is clear to auscultation with somewhat shallow air entry-no signs of labored breathing she does wear oxygen.  Heart is regular rate and rhythm without murmur gallop or rub she has somewhat distant heart sounds.  Edema appears to be relatively baseline  Abdomen is somewhat obese soft nontender with positive bowel sounds.  Musculoskeletal Limited exam since she is in bed but able to move all extremities and appears at baseline with baseline lower extremity weakness.  Neurologic is grossly intact her speech is clear could not appreciate lateralizing findings cranial nerves appear to be intact.  Psych she continues to be pleasant and appropriate at baseline  Labs reviewed:  October 06, 2018.  Sodium 141  potassium 4.5 BUN 31.4 creatinine 1.07.  On metabolic panel glucose which was 173.  Albumin was 4.1 otherwise liver function tests within normal limits.  WBC 6.6 hemoglobin 11.0 platelets 193.  TSH was 2.23.  LDL was 81.  Vitamin D level was 42 08.    Assessment and plan.  1.  Type 2 diabetes-this is a challenging situation-this was discussed with Dr. [name] via phone and at this point will continue to monitor and continue a fairly aggressive sliding scale.  Would be hesitant to increase long-term insulin because of hypoglycemia risk overnight- it appears her blood sugar does come down with a sliding scale but will continue to monitor this- possibly consider routine Humalog with meals but will await Dr. Mardelle Matte input since there is quite a bit of variability here and again we really want to avoid hypoglycemia  Clinically she appears to be doing well.  She also continues on an ACE inhibitor low-dose lisinopril 2.5 mg a day.  2.  History of vitamin D deficiency she is on supplementation vitamin D level is now within acceptable range at 42.8 previously had been around 28 at this point continue to monitor.  3.  History of polyneuropathy most likely secondary to diabetes this appears to be stabilized she is on Neurontin 600 mg 3 times daily as well as Cymbalta 60 mg a day which was recently increased because of neuropathy complaints  #4 history of chronic kidney disease this appears stable with a creatinine of 1.07 on lab done on September 29 this appears relatively baseline   Additional recent labs Recent Labs    03/03/18 05/26/18 07/15/18  NA 142 144 142  K 3.8 4.4 4.6  BUN 15 33* 27*  CREATININE 0.9 0.9 1.1   Recent Labs    02/25/18 05/26/18 07/15/18  AST ALT ALKPHOS 90 101 80   Recent Labs    02/25/18 05/26/18 07/15/18  WBC 7.5 5.8 3.5  NEUTROABS  --  4 2  HGB 10.9* 10.9* 11.3*  HCT 34* 34* 35*  PLT 252 207 167   Lab Results  Component  Value Date   TSH 2.00 05/26/2018   Lab Results  Component Value Date   HGBA1C 8.7 05/26/2018   Lab Results  Component Value Date  CHOL 156 05/26/2018   HDL 47 05/26/2018   LDLCALC 95 05/26/2018   TRIG 118 05/26/2018         CPT- [id number]     Edmon Crape, PA-C [phone]

## 2018-10-16 LAB — VITAMIN D 25 HYDROXY (VIT D DEFICIENCY, FRACTURES): Vit D, 25-Hydroxy: 48.43

## 2018-10-21 ENCOUNTER — Encounter: Payer: Self-pay | Admitting: Internal Medicine

## 2018-10-21 NOTE — Progress Notes (Deleted)
Location:  [facility] Room Number: 216-W Place of Service:  SNF [phone]) Provider:  Edmon Crape, PA-C  [name], MD  Patient Care Team: [name], MD as PCP - General (Internal Medicine)  Extended Emergency Contact Information Primary Emergency Contact: Laney Potash Address: [address].          [place], [place] [address] Darden Amber of [place] Home Phone: [phone] Relation: Son Preferred language: English Interpreter needed? No Secondary Emergency Contact: Marcille Buffy Home Phone: [phone] Relation: Sister  Code Status:  DNR Goals of care: Advanced Directive information Advanced Directives 09/30/2018  Does Patient Have a Medical Advance Directive? -  Type of Advance Directive Out of facility DNR (pink MOST or yellow form)  Does patient want to make changes to medical advance directive? No - Patient declined  Would patient like information on creating a medical advance directive? -  Pre-existing out of facility DNR order (yellow form or pink MOST form) Yellow form placed in chart (order not valid for inpatient use)     Chief Complaint  Patient presents with  . Medical Management of Chronic Issues    Routine [facility] visit    HPI:  Pt is an 73 y.o. female seen today for medical management of chronic diseases.      Past Medical History:  Diagnosis Date  . Arthritis   . Depression   . Hypertension   . Pulmonary embolism [facility])    Past Surgical History:  Procedure Laterality Date  . ABDOMINAL HYSTERECTOMY    . BACK SURGERY    . REPLACEMENT TOTAL KNEE BILATERAL      No Known Allergies  Outpatient Encounter Medications as of 10/21/2018  Medication Sig  . acetaminophen (TYLENOL) 325 MG tablet Take 650 mg by mouth every 6 (six) hours as needed for mild pain or fever.   Marland Kitchen acetaminophen (TYLENOL) 500 MG tablet Take 1,000 mg by mouth 2 (two) times daily.   Marland Kitchen albuterol (VENTOLIN HFA) 108 (90 Base)  MCG/ACT inhaler Inhale 2 puffs into the lungs 3 (three) times daily.  Marland Kitchen aspirin EC 81 MG tablet Take 81 mg by mouth daily.  . DULoxetine (CYMBALTA) 60 MG capsule Take 60 mg by mouth daily.  . ferrous sulfate 325 (65 FE) MG tablet Take 325 mg by mouth daily with breakfast.  . fluticasone (FLONASE ALLERGY RELIEF) 50 MCG/ACT nasal spray 2 Sprays by Nasal route nightly to relieve seasonal allergy  . furosemide (LASIX) 20 MG tablet Take 20 mg by mouth daily.  Marland Kitchen gabapentin (NEURONTIN) 600 MG tablet Take 600 mg by mouth 3 (three) times daily.  . Glucerna (GLUCERNA) LIQD Take 237 mLs by mouth 2 (two) times daily between meals.   . insulin glargine (LANTUS) 100 UNIT/ML injection Inject 20 Units into the skin daily.   . insulin lispro (HUMALOG KWIKPEN) 100 UNIT/ML KwikPen CBG AC with SSI 70- 120 =0unit 121-150=2units,151-200=3units,201-250=5units,251-300=8 units,301-350=11units,351 -400=15 units,greater than 400 call MD and give 15 units  . lisinopril (ZESTRIL) 2.5 MG tablet Take 2.5 mg by mouth daily.  Marland Kitchen loratadine (CLARITIN) 10 MG tablet Take 10 mg by mouth daily.   . metoprolol tartrate (LOPRESSOR) 25 MG tablet Take 12.5 mg by mouth 2 (two) times daily. 0.5 tablet to = 12.5 mg  . pantoprazole (PROTONIX) 40 MG tablet Take 40 mg by mouth daily.  . polyethylene glycol (MIRALAX / GLYCOLAX) packet Take 17 g by mouth daily.  . potassium chloride (K-DUR,KLOR-CON) 10 MEQ tablet  Take 10 mEq by mouth daily.  . promethazine (PHENERGAN) 25 MG tablet Take 25 mg by mouth every 6 (six) hours as needed for nausea or vomiting.   No facility-administered encounter medications on file as of 10/21/2018.     Review of Systems  Immunization History  Administered Date(s) Administered  . Influenza-Unspecified 09/07/2016, 09/25/2017  . Pneumococcal Polysaccharide-23 11/25/2017   Pertinent  Health Maintenance Due  Topic Date Due  . INFLUENZA VACCINE  08/08/2018  . DEXA SCAN  10/08/2019 (Originally 07/06/2001)  .  OPHTHALMOLOGY EXAM  11/24/2019 (Originally 05/27/1945)  . FOOT EXAM  10/25/2018  . HEMOGLOBIN A1C  11/26/2018  . PNA vac Low Risk Adult (2 of 2 - PCV13) 11/26/2018   Fall Risk  11/21/2017  Falls in the past year? 0  Number falls in past yr: 0  Injury with Fall? 0   Functional Status Survey:    Vitals:   10/21/18 1431  BP: 103/64  Pulse: 91  Resp: 18  Temp: (!) 97.2 F (36.2 C)  TempSrc: Oral  SpO2: 97%  Weight: 219 lb 9.6 oz (99.6 kg)  Height: 5' (1.524 m)   Body mass index is 42.89 kg/m. Physical Exam  Labs reviewed: Recent Labs    03/03/18 05/26/18 07/15/18  NA 142 144 142  K 3.8 4.4 4.6  BUN 15 33* 27*  CREATININE 0.9 0.9 1.1   Recent Labs    02/25/18 05/26/18 07/15/18  AST 15 18 19   ALT 8 23 15   ALKPHOS 90 101 80   Recent Labs    02/25/18 05/26/18 07/15/18  WBC 7.5 5.8 3.5  NEUTROABS  --  4 2  HGB 10.9* 10.9* 11.3*  HCT 34* 34* 35*  PLT 252 207 167   Lab Results  Component Value Date   TSH 2.00 05/26/2018   Lab Results  Component Value Date   HGBA1C 8.7 05/26/2018   Lab Results  Component Value Date   CHOL 156 05/26/2018   HDL 47 05/26/2018   LDLCALC 95 05/26/2018   TRIG 118 05/26/2018    Significant Diagnostic Results in last 30 days:  No results found.  Assessment/Plan There are no diagnoses linked to this encounter.   Family/ staff Communication: ***  Labs/tests ordered:  ***

## 2018-10-22 ENCOUNTER — Non-Acute Institutional Stay (SKILLED_NURSING_FACILITY): Payer: Medicare Other | Admitting: Internal Medicine

## 2018-10-22 DIAGNOSIS — N183 Chronic kidney disease, stage 3 unspecified: Secondary | ICD-10-CM

## 2018-10-22 DIAGNOSIS — G629 Polyneuropathy, unspecified: Secondary | ICD-10-CM | POA: Diagnosis not present

## 2018-10-22 DIAGNOSIS — E1140 Type 2 diabetes mellitus with diabetic neuropathy, unspecified: Secondary | ICD-10-CM

## 2018-10-22 DIAGNOSIS — I10 Essential (primary) hypertension: Secondary | ICD-10-CM

## 2018-10-22 DIAGNOSIS — I2699 Other pulmonary embolism without acute cor pulmonale: Secondary | ICD-10-CM

## 2018-10-22 DIAGNOSIS — K219 Gastro-esophageal reflux disease without esophagitis: Secondary | ICD-10-CM

## 2018-10-22 DIAGNOSIS — Z794 Long term (current) use of insulin: Secondary | ICD-10-CM

## 2018-10-22 NOTE — Progress Notes (Signed)
This is a routine visit.  Level care skilled.  Facility is [facility].  Chief complaint-routine visit for medical management of chronic medical conditions including hypertension GERD chronic kidney disease type 2 diabetes polyneuropathy osteoarthritis history of lower extremity edema anemia-history of pulmonary embolism allergic rhinitis.  History of present illness.  Patient is a pleasant 73 year old female with the above diagnosis nursing does not report any recent acute issues-patient herself does not really complain of any acute issues either.  She was seen recently for her blood sugars which at times can run somewhat high-however this has been somewhat challenging with her high risk for hypoglycemia overnight.  She is on Humalog sliding scale-she is on Lantus 20 units routinely.  Blood sugars continue to be variable more in the 100s in the morning but can rise variably into the 200s sometimes 300s later in the day.  Her hemoglobin A1c was 9.0 in late September which is relatively baseline with what it was previously when it was in the high eights.  .  Currently she is resting in bed comfortably vital signs appear to be stable.  In regards to her other issues does have a history of hypertension which appears to be stable on Lasix 20 mg a day lisinopril 2.5 mg a day and Lopressor 12.5 mg twice daily recent blood pressures 124/74-103/64-the highest when I see is 147 over 70s but this appears to be fairly uncommon.  She does have a history of diabetic related polyneuropathy she continues on Neurontin 600 mg 3 times daily as well as Cymbalta 60 mg a day secondary to consistent complaints of neuropathy which at times is her main complaint.  She did ask if she could get Tylenol intermittently and she does have a as needed order I did speak with nursing about this as well as with patient and encouraged her to ask for this since she feels this may give her some relief.  In regards to  GERD she continues on Protonix 40 mg a day and an anemia she is on iron hemoglobin has been stable it was 11.6 on lab done in late September.  She does have a history of pulmonary embolism she is completed treatment for this she is on low-dose aspirin.  Past Medical History:  Diagnosis Date  . Arthritis   . Depression   . Hypertension   . Pulmonary embolism [facility])          Past Surgical History:  Procedure Laterality Date  . ABDOMINAL HYSTERECTOMY    . BACK SURGERY    . REPLACEMENT TOTAL KNEE BILATERAL        No Known Allergies        Medication List             acetaminophen 325 MG tablet Commonly known as: TYLENOL Take 650 mg by mouth every 6 (six) hours as needed for mild pain or fever.   acetaminophen 500 MG tablet Commonly known as: TYLENOL Take 1,000 mg by mouth 2 (two) times daily.   albuterol 108 (90 Base) MCG/ACT inhaler Commonly known as: VENTOLIN HFA Inhale 2 puffs into the lungs 3 (three) times daily.   aspirin EC 81 MG tablet Take 81 mg by mouth daily.   Claritin 10 MG tablet Generic drug: loratadine Take 10 mg by mouth daily.   DULoxetine 30 MG capsule Commonly known as: CYMBALTA Take 30 mg by mouth daily.   ferrous sulfate 325 (65 FE) MG tablet Take 325 mg by mouth daily with  breakfast.   Flonase Allergy Relief 50 MCG/ACT nasal spray Generic drug: fluticasone 2 Sprays by Nasal route nightly to relieve seasonal allergy   furosemide 20 MG tablet Commonly known as: LASIX Take 20 mg by mouth daily.   gabapentin 600 MG tablet Commonly known as: NEURONTIN Take 600 mg by mouth 3 (three) times daily.   Glucerna Liqd Take 237 mLs by mouth 2 (two) times daily between meals.   HumaLOG KwikPen 100 UNIT/ML KwikPen Generic drug: insulin lispro CBG AC with SSI 70- 120 =0unit 121-150=2units,151-200=3units,201-250=5units,251-300=8 units,301-350=11units,351 -400=15 units,greater than 400 call MD and give 15 units    insulin glargine 100 UNIT/ML injection Commonly known as: LANTUS Inject 20 Units into the skin daily.   lisinopril 2.5 MG tablet Commonly known as: ZESTRIL Take 2.5 mg by mouth daily.   metoprolol tartrate 25 MG tablet Commonly known as: LOPRESSOR Take 12.5 mg by mouth 2 (two) times daily. 0.5 tablet to = 12.5 mg   pantoprazole 40 MG tablet Commonly known as: PROTONIX Take 40 mg by mouth daily.   polyethylene glycol 17 g packet Commonly known as: MIRALAX / GLYCOLAX Take 17 g by mouth daily.   potassium chloride 10 MEQ tablet Commonly known as: K-DUR Take 10 mEq by mouth daily.   promethazine 25 MG tablet Commonly known as: PHENERGAN Take 25 mg by mouth every 6 (six) hours as needed for nausea or vomiting.   Vitamin D3 1.25 MG (50000 UT) Caps Take 1 capsule by mouth once a week.      Review of systems.  In general she is not complaining of any fever or chills.  Skin does not complain of rashes or itching.  Head ears eyes nose mouth and throat not complain of visual changes or sore throat.  Respiratory does not complain of being short of breath or coughing she is on chronic oxygen.  Cardiac does not complain of chest pain appears to have baseline mild/moderate edema.  GI is not complaining of abdominal discomfort nausea vomiting diarrhea she has she has fairly regular bowel movements but sometimes are not very large  Musculoskeletal does not really complain of joint pain her pain complaints appear to be more neuropathic at times.  Neurologic again does have a history of neuropathy with some complaints at times of burning and stinging.  Psych does not complain of being depressed or anxious.     Physical exam.  Temperature 97.0-pulse 71 respirations 16 blood pressure 124/74  In general this is a very pleasant elderly female in no distress lying comfortably in bed.  Her skin is warm and dry.  Eyes visual acuity appears grossly intact sclera and  conjunctive are clear.  Oropharynx is clear mucous membranes moist.  Chest is clear to auscultation with somewhat reduced breath sounds there is no labored breathing.  Heart is regular rate and rhythm without murmur gallop or rub she has mild lower extremity edema.  Abdomen is obese soft nontender with positive bowel sounds.  Musculoskeletal continues with lower extremity weakness with some edema moves her upper extremities at baseline.  Neurologic is grossly intact her speech is clear could not really appreciate lateralizing findings.  Psych she is grossly alert and oriented pleasant and appropriate.   Labs.  October 06, 2018.  Hemoglobin A1c 9.0.  Sodium 141 potassium 4.5 BUN 31.4 creatinine 1.07 at that time glucose was 173.  WBC 6.6 hemoglobin 11.6 platelets 193.  TSH 2.23.  LDL of 81.  Vitamin D was 42.8.  Assessment and plan.  1.  History of hypertension this appears to be stable as noted above she is on Lasix as well as lisinopril 2.5 mg a day Lopressor 12.5 mg twice daily.  2.  History of type 2 diabetes this continues to be somewhat challenging with concerns for risk of hypoglycemia early morning-at this point will monitor continues Humalog sliding scale Lantus 20 units a day-hemoglobin A1c was 9.0-this will need monitoring however.--This has been discussed with Dr. Sheppard Coil previously  3.  History of polyneuropathy diabetic related she is on Neurontin 600 mg 3 times daily as well as Cymbalta 60 mg a day also did speak with her and nursing about asking for her as needed Tylenol which apparently gives her relief as well.  4.-History of osteoarthritis again she does have orders for Tylenol as needed.  5.  History of lower extremity edema continues on Lasix 20 mg a day with potassium supplementation last metabolic panel showed stability with a creatinine of 1.07 potassium of 4.5.  6.  History of anemia she continues on iron hemoglobin has been stable in the 11's  range recently.  7.  History of pulmonary embolism she is completed therapy for this she is now on low-dose aspirin.  8.  History of allergic rhinitis she continues on Claritin 10 mg a day she is also on Flonase.  9.  History of constipation she continues on MiraLAX at times she will complain of constipation I did speak with nursing about this and they will be watching and be aggressive with the MiraLAX if needed  #10 history of depression she continues on Cymbalta this appears to be stable.  11.  History of chronic kidney disease this appears relatively stable with creatinine 1.07 on most recent lab will monitor periodically   [phone]

## 2018-10-23 ENCOUNTER — Encounter: Payer: Self-pay | Admitting: Internal Medicine

## 2018-10-23 NOTE — Progress Notes (Signed)
This encounter was created in error - please disregard.

## 2018-10-25 ENCOUNTER — Encounter: Payer: Self-pay | Admitting: Internal Medicine

## 2018-10-27 ENCOUNTER — Non-Acute Institutional Stay (SKILLED_NURSING_FACILITY): Payer: Medicare Other | Admitting: Internal Medicine

## 2018-10-27 DIAGNOSIS — IMO0002 Reserved for concepts with insufficient information to code with codable children: Secondary | ICD-10-CM

## 2018-10-27 DIAGNOSIS — E1165 Type 2 diabetes mellitus with hyperglycemia: Secondary | ICD-10-CM | POA: Diagnosis not present

## 2018-10-27 DIAGNOSIS — E1140 Type 2 diabetes mellitus with diabetic neuropathy, unspecified: Secondary | ICD-10-CM | POA: Diagnosis not present

## 2018-11-02 ENCOUNTER — Encounter: Payer: Self-pay | Admitting: Internal Medicine

## 2018-11-02 NOTE — Progress Notes (Signed)
Location:   [facility]:   SNF  Sheppard Coil, Noah Delaine, MD  Patient Care Team: Hennie Duos, MD as PCP - General (Internal Medicine)  Extended Emergency Contact Information Primary Emergency Contact: Marshall Cork Address: [address]          [place], [place] [address] Johnnette Litter of [place] Phone: [phone] Relation: Son Preferred language: English Interpreter needed? No Secondary Emergency Contact: Oren Binet Home Phone: [phone] Relation: Sister    Allergies: Patient has no known allergies.  Chief Complaint  Patient presents with  . Acute Visit    HPI: Patient is 73 y.o. female who nursing asked me to see because her blood sugars have been running high.  Patient has had no symptoms.  Patient has recently been diagnosed as diabetic.  Past Medical History:  Diagnosis Date  . Arthritis   . Depression   . Hypertension   . Pulmonary embolism [facility])     Past Surgical History:  Procedure Laterality Date  . ABDOMINAL HYSTERECTOMY    . BACK SURGERY    . REPLACEMENT TOTAL KNEE BILATERAL      Allergies as of 10/27/2018   No Known Allergies     Medication List       Accurate as of October 27, 2018 11:59 PM. If you have any questions, ask your nurse or doctor.        acetaminophen 325 MG tablet Commonly known as: TYLENOL Take 650 mg by mouth every 6 (six) hours as needed for mild pain or fever.   acetaminophen 500 MG tablet Commonly known as: TYLENOL Take 1,000 mg by mouth 2 (two) times daily.   albuterol 108 (90 Base) MCG/ACT inhaler Commonly known as: VENTOLIN HFA Inhale 2 puffs into the lungs 3 (three) times daily.   aspirin EC 81 MG tablet Take 81 mg by mouth daily.   Claritin 10 MG tablet Generic drug: loratadine Take 10 mg by mouth daily.   DULoxetine 60 MG capsule Commonly known as: CYMBALTA Take 60 mg by mouth daily.   ferrous sulfate 325 (65 FE) MG tablet Take 325 mg by mouth daily with  breakfast.   Flonase Allergy Relief 50 MCG/ACT nasal spray Generic drug: fluticasone 2 Sprays by Nasal route nightly to relieve seasonal allergy   furosemide 20 MG tablet Commonly known as: LASIX Take 20 mg by mouth daily.   gabapentin 600 MG tablet Commonly known as: NEURONTIN Take 600 mg by mouth 3 (three) times daily.   Glucerna Liqd Take 237 mLs by mouth 2 (two) times daily between meals.   HumaLOG KwikPen 100 UNIT/ML KwikPen Generic drug: insulin lispro CBG AC with SSI 70- 120 =0unit 121-150=2units,151-200=3units,201-250=5units,251-300=8 units,301-350=11units,351 -400=15 units,greater than 400 call MD and give 15 units   insulin glargine 100 UNIT/ML injection Commonly known as: LANTUS Inject 20 Units into the skin daily.   lisinopril 2.5 MG tablet Commonly known as: ZESTRIL Take 2.5 mg by mouth daily.   metoprolol tartrate 25 MG tablet Commonly known as: LOPRESSOR Take 12.5 mg by mouth 2 (two) times daily. 0.5 tablet to = 12.5 mg   pantoprazole 40 MG tablet Commonly known as: PROTONIX Take 40 mg by mouth daily.   polyethylene glycol 17 g packet Commonly known as: MIRALAX / GLYCOLAX Take 17 g by mouth daily.   potassium chloride 10 MEQ tablet Commonly known as: KLOR-CON Take 10 mEq by mouth daily.   promethazine 25 MG tablet Commonly known as: PHENERGAN Take 25 mg  by mouth every 6 (six) hours as needed for nausea or vomiting.       No orders of the defined types were placed in this encounter.   Immunization History  Administered Date(s) Administered  . Influenza-Unspecified 09/07/2016, 09/25/2017  . Pneumococcal Polysaccharide-23 11/25/2017    Social History   Tobacco Use  . Smoking status: Former Smoker    Years: 55.00    Quit date: 04/2016    Years since quitting: 2.5  . Smokeless tobacco: Never Used  Substance Use Topics  . Alcohol use: No    Frequency: Never    Review of Systems  DATA OBTAINED: from nurse GENERAL:  no fevers,  fatigue, appetite changes SKIN: No itching, rash HEENT: No complaint RESPIRATORY: No cough, wheezing, SOB CARDIAC: No chest pain, palpitations, lower extremity edema  GI: No abdominal pain, No N/V/D or constipation, No heartburn or reflux  GU: No dysuria, frequency or urgency, or incontinence  MUSCULOSKELETAL: No unrelieved bone/joint pain NEUROLOGIC: No headache, dizziness  PSYCHIATRIC: No overt anxiety or sadness  Vitals:   11/02/18 2044  BP: 126/76  Pulse: 69  Resp: 18  Temp: 97.8 F (36.6 C)   There is no height or weight on file to calculate BMI. Physical Exam  GENERAL APPEARANCE: Alert, conversant, No acute distress  SKIN: No diaphoresis rash HEENT: Unremarkable RESPIRATORY: Breathing is even, unlabored.   CARDIOVASCULAR:  No peripheral edema  GASTROINTESTINAL: Abdomen is not distended w/ normal bowel sounds.  GENITOURINARY: Bladder not distended  MUSCULOSKELETAL: No abnormal joints or musculature NEUROLOGIC: Cranial nerves 2-12 grossly intact. Moves all extremities PSYCHIATRIC: Mood and affect appropriate to situation, no behavioral issues  Patient Active Problem List   Diagnosis Date Noted  . COVID-19 virus infection 07/29/2018  . Hyperosmolar hyperglycemic coma due to diabetes mellitus without ketoacidosis (HCC) 02/21/2018  . New onset type 2 diabetes mellitus (HCC) 02/21/2018  . Acute renal failure superimposed on stage 3 chronic kidney disease (HCC) 02/21/2018  . Hypernatremia 02/21/2018  . Hypokalemia 02/21/2018  . Decubitus ulcer of coccygeal region, stage 2 (HCC) 02/21/2018  . GERD (gastroesophageal reflux disease) 12/21/2017  . Declining functional status 08/18/2017  . Decreased functional mobility 08/18/2017  . Chronic kidney disease (CKD) 06/02/2017  . Arthritis 06/02/2017  . GI bleed 02/15/2017  . Acute blood loss anemia 02/15/2017  . Escherichia coli urinary tract infection 02/15/2017  . Hypoxia 02/15/2017  . Edema of upper extremity 02/15/2017   . Bilateral lower extremity edema 02/15/2017  . Pulmonary embolism (HCC) 02/08/2017  . Hypertension 02/08/2017  . Acute kidney injury superimposed on chronic kidney disease (HCC) 02/08/2017  . Polyneuropathy 02/08/2017  . Depression 02/08/2017    CMP     Component Value Date/Time   NA 142 07/15/2018   K 4.6 07/15/2018   BUN 27 (A) 07/15/2018   CREATININE 1.1 07/15/2018   AST 19 07/15/2018   ALT 15 07/15/2018   ALKPHOS 80 07/15/2018   Recent Labs    03/03/18 05/26/18 07/15/18  NA 142 144 142  K 3.8 4.4 4.6  BUN 15 33* 27*  CREATININE 0.9 0.9 1.1   Recent Labs    02/25/18 05/26/18 07/15/18  AST 15 18 19   ALT 8 23 15   ALKPHOS 90 101 80   Recent Labs    02/25/18 05/26/18 07/15/18  WBC 7.5 5.8 3.5  NEUTROABS  --  4 2  HGB 10.9* 10.9* 11.3*  HCT 34* 34* 35*  PLT 252 207 167   Recent Labs    02/25/18  05/05/18 05/26/18  CHOL 140 144 156  LDLCALC 68 80 95  TRIG 142 172* 118   No results found for: [facility] Lab Results  Component Value Date   TSH 2.00 05/26/2018   Lab Results  Component Value Date   HGBA1C 8.7 05/26/2018   Lab Results  Component Value Date   CHOL 156 05/26/2018   HDL 47 05/26/2018   LDLCALC 95 05/26/2018   TRIG 118 05/26/2018    Significant Diagnostic Results in last 30 days:  No results found.  Assessment and Plan to 76,  Hyperglycemia/diabetes mellitus type 2, uncontrolled-I have gone over 18 days worth of blood sugars 3 times a day; blood sugars in the morning run usually below 200 with a high of 275 and unfortunately 2 lows of 115 and 122; blood sugar same to increase across the day lunch running 200 300, dinner running higher 200s to 300s but in reality they are all over the place; for now sliding scale insulin since the best with control; patient is on moderate sliding scale insulin, will change to resistant sliding scale insulin and follow-up for the next 2 weeks then reassess   ;> 50% of time with patient was spent reviewing  records, labs, tests and studies, counseling and developing plan of care  Margit HanksAnne D Layna Roeper, MD

## 2018-11-20 ENCOUNTER — Encounter: Payer: Self-pay | Admitting: Internal Medicine

## 2018-11-20 ENCOUNTER — Non-Acute Institutional Stay (SKILLED_NURSING_FACILITY): Payer: Medicare Other | Admitting: Internal Medicine

## 2018-11-20 DIAGNOSIS — E118 Type 2 diabetes mellitus with unspecified complications: Secondary | ICD-10-CM | POA: Diagnosis not present

## 2018-11-20 DIAGNOSIS — E1165 Type 2 diabetes mellitus with hyperglycemia: Secondary | ICD-10-CM | POA: Diagnosis not present

## 2018-11-20 DIAGNOSIS — Z794 Long term (current) use of insulin: Secondary | ICD-10-CM | POA: Diagnosis not present

## 2018-11-20 NOTE — Progress Notes (Signed)
Location[facility] Room Number: 216-W Place of Service:  SNF (31)  [name], MD  Patient Care Team: [name], MD as PCP - General (Internal Medicine)  Extended Emergency Contact Information Primary Emergency Contact: Laney Potash Address: [address]          [place], [place] [address] Darden Amber of [place] Home Phone: [phone] Relation: Son Preferred language: English Interpreter needed? No Secondary Emergency Contact: Marcille Buffy Home Phone: [phone] Relation: Sister    Allergies: Patient has no known allergies.  Chief Complaint  Patient presents with  . Acute Visit    Patient is seen for blood sugar control.    HPI: Patient is an 73 y.o. female who is being seen because her blood sugars are high.  First intervention was increasing her from moderate sliding scale to resistant sliding scale this is not appear to help much.  Patient has no complaints no problems or infections from high blood sugar.  Nurses cannot say whether she has been compliant.  Past Medical History:  Diagnosis Date  . Arthritis   . Depression   . Hypertension   . Pulmonary embolism [facility])     Past Surgical History:  Procedure Laterality Date  . ABDOMINAL HYSTERECTOMY    . BACK SURGERY    . REPLACEMENT TOTAL KNEE BILATERAL      Allergies as of 11/20/2018   No Known Allergies     Medication List       Accurate as of November 20, 2018 11:59 PM. If you have any questions, ask your nurse or doctor.        acetaminophen 325 MG tablet Commonly known as: TYLENOL Take 650 mg by mouth every 6 (six) hours as needed for mild pain or fever.   acetaminophen 500 MG tablet Commonly known as: TYLENOL Take 1,000 mg by mouth 2 (two) times daily.   albuterol 108 (90 Base) MCG/ACT inhaler Commonly known as: VENTOLIN HFA Inhale 2 puffs into the lungs 3 (three) times daily.   aspirin EC 81 MG tablet Take 81 mg by mouth  daily.   Claritin 10 MG tablet Generic drug: loratadine Take 10 mg by mouth daily.   DULoxetine 60 MG capsule Commonly known as: CYMBALTA Take 60 mg by mouth daily.   ferrous sulfate 325 (65 FE) MG tablet Take 325 mg by mouth daily with breakfast.   Flonase Allergy Relief 50 MCG/ACT nasal spray Generic drug: fluticasone 2 Sprays by Nasal route nightly to relieve seasonal allergy   furosemide 20 MG tablet Commonly known as: LASIX Take 20 mg by mouth daily.   gabapentin 600 MG tablet Commonly known as: NEURONTIN Take 600 mg by mouth 3 (three) times daily.   Glucerna Liqd Take 237 mLs by mouth 2 (two) times daily between meals.   HumaLOG KwikPen 100 UNIT/ML KwikPen Generic drug: insulin lispro CBG AC with SSI 70- 120 = 0 unit 121-150=3 units,151-200=3 units, 201-250=7 units, 251-300=11 units, 301-350=15 units, 351-400=20 units, greater than 400 call MD and give 20 units   insulin glargine 100 UNIT/ML injection Commonly known as: LANTUS Inject 24 Units into the skin daily.   lisinopril 2.5 MG tablet Commonly known as: ZESTRIL Take 2.5 mg by mouth daily.   metoprolol tartrate 25 MG tablet Commonly known as: LOPRESSOR Take 12.5 mg by mouth 2 (two) times daily. 0.5 tablet to = 12.5 mg   pantoprazole 40 MG tablet Commonly known as: PROTONIX Take 40 mg by  mouth daily.   polyethylene glycol 17 g packet Commonly known as: MIRALAX / GLYCOLAX Take 17 g by mouth daily.   potassium chloride 10 MEQ tablet Commonly known as: KLOR-CON Take 10 mEq by mouth daily.   PREVIDENT 5000 BOOSTER DT Place 1 application onto teeth daily. Brush on teeth with toothbrush after PM mouth care, spit out excess, do not rinse   promethazine 25 MG tablet Commonly known as: PHENERGAN Take 25 mg by mouth every 6 (six) hours as needed for nausea or vomiting.       No orders of the defined types were placed in this encounter.   Immunization History  Administered Date(s) Administered  .  Influenza-Unspecified 09/07/2016, 09/25/2017  . Pneumococcal Polysaccharide-23 11/25/2017    Social History   Tobacco Use  . Smoking status: Former Smoker    Years: 55.00    Quit date: 04/2016    Years since quitting: 2.6  . Smokeless tobacco: Never Used  Substance Use Topics  . Alcohol use: No    Frequency: Never    Review of Systems  GENERAL:  no fevers, fatigue, appetite changes SKIN: No itching, rash HEENT: No complaint RESPIRATORY: No cough, wheezing, SOB CARDIAC: No chest pain, palpitations, lower extremity edema  GI: No abdominal pain, No N/V/D or constipation, No heartburn or reflux  GU: No dysuria, frequency or urgency, or incontinence  MUSCULOSKELETAL: No unrelieved bone/joint pain NEUROLOGIC: No headache, dizziness  PSYCHIATRIC: No overt anxiety or sadness  Vitals:   11/20/18 1621  BP: 130/72  Pulse: 90  Resp: 18  Temp: 98 F (36.7 C)  SpO2: 97%   Body mass index is 43.08 kg/m. Physical Exam  GENERAL APPEARANCE: Alert, conversant, No acute distress  SKIN: No diaphoresis rash HEENT: Unremarkable RESPIRATORY: Breathing is even, unlabored.  CARDIOVASCULAR:  No peripheral edema  GASTROINTESTINAL: Abdomen not distended w/ normal bowel sounds.  GENITOURINARY: Bladder  not distended  MUSCULOSKELETAL: No abnormal joints or musculature NEUROLOGIC: Cranial nerves 2-12 grossly intact. Moves all extremities PSYCHIATRIC: Mood and affect appropriate to situation, no behavioral issues  Patient Active Problem List   Diagnosis Date Noted  . COVID-19 virus infection 07/29/2018  . Hyperosmolar hyperglycemic coma due to diabetes mellitus without ketoacidosis (HCC) 02/21/2018  . New onset type 2 diabetes mellitus (HCC) 02/21/2018  . Acute renal failure superimposed on stage 3 chronic kidney disease (HCC) 02/21/2018  . Hypernatremia 02/21/2018  . Hypokalemia 02/21/2018  . Decubitus ulcer of coccygeal region, stage 2 (HCC) 02/21/2018  . GERD (gastroesophageal reflux  disease) 12/21/2017  . Declining functional status 08/18/2017  . Decreased functional mobility 08/18/2017  . Chronic kidney disease (CKD) 06/02/2017  . Arthritis 06/02/2017  . GI bleed 02/15/2017  . Acute blood loss anemia 02/15/2017  . Escherichia coli urinary tract infection 02/15/2017  . Hypoxia 02/15/2017  . Edema of upper extremity 02/15/2017  . Bilateral lower extremity edema 02/15/2017  . Pulmonary embolism (HCC) 02/08/2017  . Hypertension 02/08/2017  . Acute kidney injury superimposed on chronic kidney disease (HCC) 02/08/2017  . Polyneuropathy 02/08/2017  . Depression 02/08/2017    CMP     Component Value Date/Time   NA 142 07/15/2018   K 4.6 07/15/2018   BUN 27 (A) 07/15/2018   CREATININE 1.1 07/15/2018   AST 19 07/15/2018   ALT 15 07/15/2018   ALKPHOS 80 07/15/2018   Recent Labs    03/03/18 05/26/18 07/15/18  NA 142 144 142  K 3.8 4.4 4.6  BUN 15 33* 27*  CREATININE 0.9 0.9  1.1   Recent Labs    02/25/18 05/26/18 07/15/18  AST 15 18 19   ALT 8 23 15   ALKPHOS 90 101 80   Recent Labs    02/25/18 05/26/18 07/15/18  WBC 7.5 5.8 3.5  NEUTROABS  --  4 2  HGB 10.9* 10.9* 11.3*  HCT 34* 34* 35*  PLT 252 207 167   Recent Labs    02/25/18 05/05/18 05/26/18  CHOL 140 144 156  LDLCALC 68 80 95  TRIG 142 172* 118   No results found for: [facility] Lab Results  Component Value Date   TSH 2.00 05/26/2018   Lab Results  Component Value Date   HGBA1C 8.7 05/26/2018   Lab Results  Component Value Date   CHOL 156 05/26/2018   HDL 47 05/26/2018   LDLCALC 95 05/26/2018   TRIG 118 05/26/2018    Significant Diagnostic Results in last 30 days:  No results found.  Assessment and Plan  Diabetes mellitus type 2/hyperglycemia-patient's blood sugars for every meal have a wide range.  At breakfast blood sugars run from 177-332 lunch 258- 87 and dinner 235-400.  It would be desirable to have not a sliding scale but a consistent amount of insulin to give the  patient with every meal but that does not seem possible.  Only move appears to be increasing Lantus Solostar from 20 units to 24 units nightly, continue resistant sliding scale with meals    Time spent greater than 35 minutes;> 50% of time with patient was spent reviewing records, labs, tests and studies, counseling and developing plan of care  Hennie Duos, MD

## 2018-11-22 ENCOUNTER — Encounter: Payer: Self-pay | Admitting: Internal Medicine

## 2018-11-30 ENCOUNTER — Encounter: Payer: Self-pay | Admitting: Internal Medicine

## 2018-11-30 NOTE — Progress Notes (Signed)
Location[facility] Room Number: 216-W Place of Service:  SNF [phone]) Provider:  Edmon Crape, PA-C  Patient Care Team: [name], MD as PCP - General (Internal Medicine)  Extended Emergency Contact Information Primary Emergency Contact: Laney Potash Address: [address]          [place], [place] [address] Darden Amber of [place] Home Phone: [phone] Relation: Son Preferred language: English Interpreter needed? No Secondary Emergency Contact: Marcille Buffy Home Phone: [phone] Relation: Sister  Code Status:  DNR Goals of care: Advanced Directive information Advanced Directives 11/20/2018  Does Patient Have a Medical Advance Directive? Yes  Type of Advance Directive Out of facility DNR (pink MOST or yellow form)  Does patient want to make changes to medical advance directive? No - Patient declined  Would patient like information on creating a medical advance directive? -  Pre-existing out of facility DNR order (yellow form or pink MOST form) -     Chief Complaint  Patient presents with  . Medical Management of Chronic Issues    Routine [facility] visit    HPI:  Pt is an 73 y.o. female seen today for medical management of chronic diseases.     Past Medical History:  Diagnosis Date  . Arthritis   . Depression   . Hypertension   . Pulmonary embolism [facility])    Past Surgical History:  Procedure Laterality Date  . ABDOMINAL HYSTERECTOMY    . BACK SURGERY    . REPLACEMENT TOTAL KNEE BILATERAL      No Known Allergies  Outpatient Encounter Medications as of 11/30/2018  Medication Sig  . acetaminophen (TYLENOL) 325 MG tablet Take 650 mg by mouth every 6 (six) hours as needed for mild pain or fever.   Marland Kitchen acetaminophen (TYLENOL) 500 MG tablet Take 1,000 mg by mouth 2 (two) times daily.   Marland Kitchen albuterol (VENTOLIN HFA) 108 (90 Base) MCG/ACT inhaler Inhale 2 puffs into the lungs 3 (three) times daily.  Marland Kitchen aspirin EC  81 MG tablet Take 81 mg by mouth daily.  . DULoxetine (CYMBALTA) 60 MG capsule Take 60 mg by mouth daily.  . ferrous sulfate 325 (65 FE) MG tablet Take 325 mg by mouth daily with breakfast.  . fluticasone (FLONASE ALLERGY RELIEF) 50 MCG/ACT nasal spray 2 Sprays by Nasal route nightly to relieve seasonal allergy  . furosemide (LASIX) 20 MG tablet Take 20 mg by mouth daily.  Marland Kitchen gabapentin (NEURONTIN) 600 MG tablet Take 600 mg by mouth 3 (three) times daily.  . Glucerna (GLUCERNA) LIQD Take 237 mLs by mouth 2 (two) times daily between meals.   . insulin glargine (LANTUS) 100 UNIT/ML injection Inject 24 Units into the skin daily.   . insulin lispro (HUMALOG KWIKPEN) 100 UNIT/ML KwikPen CBG AC with SSI 70- 120 = 0 unit 121-150=3 units,151-200=3 units, 201-250=7 units, 251-300=11 units, 301-350=15 units, 351-400=20 units, greater than 400 call MD and give 20 units  . lisinopril (ZESTRIL) 2.5 MG tablet Take 2.5 mg by mouth daily.  Marland Kitchen loratadine (CLARITIN) 10 MG tablet Take 10 mg by mouth daily.   . metoprolol tartrate (LOPRESSOR) 25 MG tablet Take 12.5 mg by mouth 2 (two) times daily. 0.5 tablet to = 12.5 mg  . pantoprazole (PROTONIX) 40 MG tablet Take 40 mg by mouth daily.  . polyethylene glycol (MIRALAX / GLYCOLAX) packet Take 17 g by mouth daily.  . potassium chloride (K-DUR,KLOR-CON) 10 MEQ tablet Take 10 mEq by mouth daily.  Marland Kitchen  promethazine (PHENERGAN) 25 MG tablet Take 25 mg by mouth every 6 (six) hours as needed for nausea or vomiting.  . Sodium Fluoride (PREVIDENT 5000 BOOSTER DT) Place 1 application onto teeth daily. Brush on teeth with toothbrush after PM mouth care, spit out excess, do not rinse   No facility-administered encounter medications on file as of 11/30/2018.     Review of Systems  Immunization History  Administered Date(s) Administered  . Influenza-Unspecified 09/07/2016, 09/25/2017, 10/10/2018  . Pneumococcal Polysaccharide-23 11/25/2017   Pertinent  Health Maintenance Due   Topic Date Due  . HEMOGLOBIN A1C  11/26/2018  . PNA vac Low Risk Adult (2 of 2 - PCV13) 11/26/2018  . DEXA SCAN  10/08/2019 (Originally 07/06/2001)  . OPHTHALMOLOGY EXAM  11/24/2019 (Originally 1945/10/08)  . FOOT EXAM  01/06/2019  . INFLUENZA VACCINE  Completed   Fall Risk  11/21/2017  Falls in the past year? 0  Number falls in past yr: 0  Injury with Fall? 0   Functional Status Survey:    Vitals:   11/30/18 1012  BP: 130/70  Pulse: 79  Resp: 20  Temp: 98.2 F (36.8 C)  TempSrc: Oral  SpO2: 97%  Weight: 220 lb 9.6 oz (100.1 kg)  Height: 5' (1.524 m)   Body mass index is 43.08 kg/m. Physical Exam  Labs reviewed: Recent Labs    05/26/18 07/15/18 10/06/18  NA 144 142 141  K 4.4 4.6 4.5  CL  --   --  96*  CO2  --   --  32*  BUN 33* 27* 31*  CREATININE 0.9 1.1 1.1  CALCIUM  --   --  9.6   Recent Labs    05/26/18 07/15/18 10/06/18  AST 18 19 14   ALT 23 15 19   ALKPHOS 101 80 86  ALBUMIN  --   --  4.1   Recent Labs    05/26/18 07/15/18 10/06/18  WBC 5.8 3.5 6.6  NEUTROABS 4 2 4   HGB 10.9* 11.3* 11.0*  HCT 34* 35* 36  PLT 207 167 193   Lab Results  Component Value Date   TSH 2.23 10/06/2018   Lab Results  Component Value Date   HGBA1C 9.0 10/06/2018   Lab Results  Component Value Date   CHOL 155 10/06/2018   HDL 48 10/06/2018   LDLCALC 81 10/06/2018   TRIG 131 10/06/2018    Significant Diagnostic Results in last 30 days:  No results found.  Assessment/Plan      This encounter was created in error - please disregard.

## 2018-12-01 ENCOUNTER — Encounter: Payer: Self-pay | Admitting: Internal Medicine

## 2018-12-01 ENCOUNTER — Non-Acute Institutional Stay (SKILLED_NURSING_FACILITY): Payer: Medicare Other | Admitting: Internal Medicine

## 2018-12-01 DIAGNOSIS — E119 Type 2 diabetes mellitus without complications: Secondary | ICD-10-CM

## 2018-12-01 DIAGNOSIS — Z298 Encounter for other specified prophylactic measures: Secondary | ICD-10-CM | POA: Diagnosis not present

## 2018-12-01 DIAGNOSIS — I10 Essential (primary) hypertension: Secondary | ICD-10-CM

## 2018-12-01 DIAGNOSIS — Z789 Other specified health status: Secondary | ICD-10-CM | POA: Diagnosis not present

## 2018-12-01 NOTE — Progress Notes (Signed)
Location:  [facility] Room Number: 216-W Place of Service:  SNF (31)  [name], MD  Patient Care Team: [name], MD as PCP - General (Internal Medicine)  Extended Emergency Contact Information Primary Emergency Contact: Laney Potash Address: [address]          [place], [place] [address] [place] of [place] Home Phone: [phone] Relation: Son Preferred language: English Interpreter needed? No Secondary Emergency Contact: Marcille Buffy Home Phone: [phone] Relation: Sister    Allergies: Patient has no known allergies.  Chief Complaint  Patient presents with  . Acute Visit    Patient is seen for COVID prophylaxis    HPI: Patient is an 73 y.o. female who is being seen for viral prophylaxis.  Everyone in the facility has been put on vitamin C, zinc, and vitamin D.  Past Medical History:  Diagnosis Date  . Arthritis   . Depression   . Hypertension   . Pulmonary embolism [facility])     Past Surgical History:  Procedure Laterality Date  . ABDOMINAL HYSTERECTOMY    . BACK SURGERY    . REPLACEMENT TOTAL KNEE BILATERAL      Allergies as of 12/01/2018   No Known Allergies     Medication List       Accurate as of December 01, 2018  1:41 PM. If you have any questions, ask your nurse or doctor.        acetaminophen 325 MG tablet Commonly known as: TYLENOL Take 650 mg by mouth every 6 (six) hours as needed for mild pain or fever.   acetaminophen 500 MG tablet Commonly known as: TYLENOL Take 1,000 mg by mouth 2 (two) times daily.   albuterol 108 (90 Base) MCG/ACT inhaler Commonly known as: VENTOLIN HFA Inhale 2 puffs into the lungs 3 (three) times daily.   aspirin EC 81 MG tablet Take 81 mg by mouth daily.   Claritin 10 MG tablet Generic drug: loratadine Take 10 mg by mouth daily.   DULoxetine 60 MG capsule Commonly known as: CYMBALTA Take 60 mg by mouth daily.   ferrous sulfate 325 (65  FE) MG tablet Take 325 mg by mouth daily with breakfast.   Flonase Allergy Relief 50 MCG/ACT nasal spray Generic drug: fluticasone 2 Sprays by Nasal route nightly to relieve seasonal allergy   furosemide 20 MG tablet Commonly known as: LASIX Take 20 mg by mouth daily.   gabapentin 600 MG tablet Commonly known as: NEURONTIN Take 600 mg by mouth 3 (three) times daily.   Glucerna Liqd Take 237 mLs by mouth 2 (two) times daily between meals.   HumaLOG KwikPen 100 UNIT/ML KwikPen Generic drug: insulin lispro CBG AC with SSI 70- 120 = 0 unit 121-150=3 units,151-200=3 units, 201-250=7 units, 251-300=11 units, 301-350=15 units, 351-400=20 units, greater than 400 call MD and give 20 units   insulin glargine 100 UNIT/ML injection Commonly known as: LANTUS Inject 24 Units into the skin daily.   lisinopril 2.5 MG tablet Commonly known as: ZESTRIL Take 2.5 mg by mouth daily.   metoprolol tartrate 25 MG tablet Commonly known as: LOPRESSOR Take 12.5 mg by mouth 2 (two) times daily. 0.5 tablet to = 12.5 mg   pantoprazole 40 MG tablet Commonly known as: PROTONIX Take 40 mg by mouth daily.   polyethylene glycol 17 g packet Commonly known as: MIRALAX / GLYCOLAX Take 17 g by mouth daily.   potassium chloride 10 MEQ tablet Commonly known as:  KLOR-CON Take 10 mEq by mouth daily.   PREVIDENT 5000 BOOSTER DT Place 1 application onto teeth daily. Brush on teeth with toothbrush after PM mouth care, spit out excess, do not rinse   promethazine 25 MG tablet Commonly known as: PHENERGAN Take 25 mg by mouth every 6 (six) hours as needed for nausea or vomiting.       No orders of the defined types were placed in this encounter.   Immunization History  Administered Date(s) Administered  . Influenza-Unspecified 09/07/2016, 09/25/2017, 10/10/2018  . Pneumococcal Polysaccharide-23 11/25/2017    Social History   Tobacco Use  . Smoking status: Former Smoker    Years: 55.00    Quit  date: 04/2016    Years since quitting: 2.6  . Smokeless tobacco: Never Used  Substance Use Topics  . Alcohol use: No    Frequency: Never    Review of Systems  GENERAL:  no fevers, fatigue, appetite changes SKIN: No itching, rash HEENT: No complaint RESPIRATORY: No cough, wheezing, SOB CARDIAC: No chest pain, palpitations, lower extremity edema  GI: No abdominal pain, No N/V/D or constipation, No heartburn or reflux  GU: No dysuria, frequency or urgency, or incontinence  MUSCULOSKELETAL: No unrelieved bone/joint pain NEUROLOGIC: No headache, dizziness  PSYCHIATRIC: No overt anxiety or sadness  Vitals:   12/01/18 1340  BP: 130/70  Pulse: 87  Resp: 18  Temp: 98.2 F (36.8 C)  SpO2: 95%   Body mass index is 43.08 kg/m. Physical Exam  GENERAL APPEARANCE: Alert, conversant, No acute distress  SKIN: No diaphoresis rash HEENT: Unremarkable RESPIRATORY: Breathing is even, unlabored. Lung sounds are clear   CARDIOVASCULAR: Heart RRR no murmurs, rubs or gallops. No peripheral edema  GASTROINTESTINAL: Abdomen is soft, non-tender, not distended w/ normal bowel sounds.  GENITOURINARY: Bladder non tender, not distended  MUSCULOSKELETAL: No abnormal joints or musculature NEUROLOGIC: Cranial nerves 2-12 grossly intact. Moves all extremities PSYCHIATRIC: Mood and affect appropriate to situation, no behavioral issues  Patient Active Problem List   Diagnosis Date Noted  . COVID-19 virus infection 07/29/2018  . Hyperosmolar hyperglycemic coma due to diabetes mellitus without ketoacidosis (HCC) 02/21/2018  . New onset type 2 diabetes mellitus (HCC) 02/21/2018  . Acute renal failure superimposed on stage 3 chronic kidney disease (HCC) 02/21/2018  . Hypernatremia 02/21/2018  . Hypokalemia 02/21/2018  . Decubitus ulcer of coccygeal region, stage 2 (HCC) 02/21/2018  . GERD (gastroesophageal reflux disease) 12/21/2017  . Declining functional status 08/18/2017  . Decreased functional  mobility 08/18/2017  . Chronic kidney disease (CKD) 06/02/2017  . Arthritis 06/02/2017  . GI bleed 02/15/2017  . Acute blood loss anemia 02/15/2017  . Escherichia coli urinary tract infection 02/15/2017  . Hypoxia 02/15/2017  . Edema of upper extremity 02/15/2017  . Bilateral lower extremity edema 02/15/2017  . Pulmonary embolism (HCC) 02/08/2017  . Hypertension 02/08/2017  . Acute kidney injury superimposed on chronic kidney disease (HCC) 02/08/2017  . Polyneuropathy 02/08/2017  . Depression 02/08/2017    CMP     Component Value Date/Time   NA 141 10/06/2018   K 4.5 10/06/2018   CL 96 (A) 10/06/2018   CO2 32 (A) 10/06/2018   BUN 31 (A) 10/06/2018   CREATININE 1.1 10/06/2018   CALCIUM 9.6 10/06/2018   ALBUMIN 4.1 10/06/2018   AST 14 10/06/2018   ALT 19 10/06/2018   ALKPHOS 86 10/06/2018   GFRNONAA 48.38 10/06/2018   GFRAA 56.07 10/06/2018   Recent Labs    05/26/18 07/15/18 10/06/18  NA  144 142 141  K 4.4 4.6 4.5  CL  --   --  96*  CO2  --   --  32*  BUN 33* 27* 31*  CREATININE 0.9 1.1 1.1  CALCIUM  --   --  9.6   Recent Labs    05/26/18 07/15/18 10/06/18  AST 18 19 14   ALT 23 15 19   ALKPHOS 101 80 86  ALBUMIN  --   --  4.1   Recent Labs    05/26/18 07/15/18 10/06/18  WBC 5.8 3.5 6.6  NEUTROABS 4 2 4   HGB 10.9* 11.3* 11.0*  HCT 34* 35* 36  PLT 207 167 193   Recent Labs    05/05/18 05/26/18 10/06/18  CHOL 144 156 155  LDLCALC 80 95 81  TRIG 172* 118 131   No results found for: [facility] Lab Results  Component Value Date   TSH 2.23 10/06/2018   Lab Results  Component Value Date   HGBA1C 9.0 10/06/2018   Lab Results  Component Value Date   CHOL 155 10/06/2018   HDL 48 10/06/2018   LDLCALC 81 10/06/2018   TRIG 131 10/06/2018    Significant Diagnostic Results in last 30 days:  No results found.  Assessment and Plan  Hypertension/diabetes mellitus type 2/need for immunotherapy prophylaxis/does not have immunity to COVID-19-pharmacy was  consulted.  Patient's been written for zinc sulfate 220 mg 1 p.o. every other day, vitamin D 50,000 units monthly, and vitamin C 500 mg daily.  Patient is on ASA 81 mg daily vitamin D level be drawn today and again in 90 days.    Hennie Duos, MD

## 2018-12-02 ENCOUNTER — Non-Acute Institutional Stay (SKILLED_NURSING_FACILITY): Payer: Medicare Other | Admitting: Internal Medicine

## 2018-12-02 DIAGNOSIS — E119 Type 2 diabetes mellitus without complications: Secondary | ICD-10-CM | POA: Diagnosis not present

## 2018-12-02 DIAGNOSIS — R600 Localized edema: Secondary | ICD-10-CM

## 2018-12-02 DIAGNOSIS — G629 Polyneuropathy, unspecified: Secondary | ICD-10-CM

## 2018-12-02 DIAGNOSIS — I10 Essential (primary) hypertension: Secondary | ICD-10-CM | POA: Diagnosis not present

## 2018-12-02 DIAGNOSIS — M1990 Unspecified osteoarthritis, unspecified site: Secondary | ICD-10-CM

## 2018-12-02 DIAGNOSIS — N183 Chronic kidney disease, stage 3 unspecified: Secondary | ICD-10-CM

## 2018-12-02 NOTE — Progress Notes (Signed)
This is a routine visit.  Level of care skilled.  Facility is [facility].  Chief complaint routine visit for medical management of chronic medical conditions including hypertension-chronic kidney disease-type 2 diabetes-diabetic neuropathy-osteoarthritis-lower extremity edema-anemia-and history of pulmonary embolism.  History of present illness.  Patient is a pleasant 73 year old female with the above diagnoses She appears to be stable and at baseline with no acute complaints today nursing does not report any issues.  Dr. Lyn HollingsheadAlexander has been following her blood sugars secondary to some elevated readings but this is complicated at times with risk for hypoglycemia-her Lantus was recently increased to 24 units-she is also on Humalog sliding scale-her blood sugars at times continue to be somewhat elevated often in the 2-300's  Range  Regards to her other issues she appears to be stable she does have a history of hypertension she is on lisinopril 2.5 mg a day in addition to Lopressor 12.5 mg twice daily-recent blood pressures 142/80-109/53-130/70-119/69.  She also has a history of chronic kidney disease which has been stable now for some time most recent creatinine was 1.07 in September.  In regards to neuropathy she continues on Neurontin 600 3 times daily Cymbalta was added as well-she says this is helping some.  She also has a history persistent lower extremity edema continues on Lasix 20 mg a day with potassium supplementation and this appears to be relatively Baseline.  Currently she is lying in bed comfortably she is about to eat dinner and vital signs appear to be stable.       Past Medical History:  Diagnosis Date  . Arthritis   . Depression   . Hypertension   . Pulmonary embolism [facility](HCC)          Past Surgical History:  Procedure Laterality Date  . ABDOMINAL HYSTERECTOMY    . BACK SURGERY    . REPLACEMENT TOTAL KNEE BILATERAL      Allergies as of  12/01/2018   No Known Allergies        Medication List       Accurate as of December 01, 2018  1:41 PM. If you have any questions, ask your nurse or doctor.        acetaminophen 325 MG tablet Commonly known as: TYLENOL Take 650 mg by mouth every 6 (six) hours as needed for mild pain or fever.   acetaminophen 500 MG tablet Commonly known as: TYLENOL Take 1,000 mg by mouth 2 (two) times daily.   albuterol 108 (90 Base) MCG/ACT inhaler Commonly known as: VENTOLIN HFA Inhale 2 puffs into the lungs 3 (three) times daily.   aspirin EC 81 MG tablet Take 81 mg by mouth daily.   Claritin 10 MG tablet Generic drug: loratadine Take 10 mg by mouth daily.   DULoxetine 60 MG capsule Commonly known as: CYMBALTA Take 60 mg by mouth daily.   ferrous sulfate 325 (65 FE) MG tablet Take 325 mg by mouth daily with breakfast.   Flonase Allergy Relief 50 MCG/ACT nasal spray Generic drug: fluticasone 2 Sprays by Nasal route nightly to relieve seasonal allergy   furosemide 20 MG tablet Commonly known as: LASIX Take 20 mg by mouth daily.   gabapentin 600 MG tablet Commonly known as: NEURONTIN Take 600 mg by mouth 3 (three) times daily.   Glucerna Liqd Take 237 mLs by mouth 2 (two) times daily between meals.   HumaLOG KwikPen 100 UNIT/ML KwikPen Generic drug: insulin lispro CBG AC with SSI 70- 120 = 0 unit 121-150=3  units,151-200=3 units, 201-250=7 units, 251-300=11 units, 301-350=15 units, 351-400=20 units, greater than 400 call MD and give 20 units   insulin glargine 100 UNIT/ML injection Commonly known as: LANTUS Inject 24 Units into the skin daily.   lisinopril 2.5 MG tablet Commonly known as: ZESTRIL Take 2.5 mg by mouth daily.   metoprolol tartrate 25 MG tablet Commonly known as: LOPRESSOR Take 12.5 mg by mouth 2 (two) times daily. 0.5 tablet to = 12.5 mg   pantoprazole 40 MG tablet Commonly known as: PROTONIX Take 40 mg by mouth daily.    polyethylene glycol 17 g packet Commonly known as: MIRALAX / GLYCOLAX Take 17 g by mouth daily.   potassium chloride 10 MEQ tablet Commonly known as: KLOR-CON Take 10 mEq by mouth daily.   PREVIDENT 5000 BOOSTER DT Place 1 application onto teeth daily. Brush on teeth with toothbrush after PM mouth care, spit out excess, do not rinse   promethazine 25 MG tablet Commonly known as: PHENERGAN Take 25 mg by mouth every 6 (six) hours as needed for nausea or vomiting.       No orders of the defined types were placed in this encounter.       Immunization History  Administered Date(s) Administered  . Influenza-Unspecified 09/07/2016, 09/25/2017, 10/10/2018  . Pneumococcal Polysaccharide-23 11/25/2017    Social History        Tobacco Use  . Smoking status: Former Smoker    Years: 55.00    Quit date: 04/2016    Years since quitting: 2.6  . Smokeless tobacco: Never Used  Substance Use Topics  . Alcohol use: No    Frequency: Never    Review of systems.  General she is not complaining of any fever or chills.  Skin does not complain of rashes or itching  Head ears eyes nose mouth and throat is not complain of visual changes or sore throat.  Respiratory denies shortness of breath or cough.  Cardiac is not complaining of chest pain has chronic lower extremity edema which appears relatively unchanged possibly slightly improved.  GI is not complaining of abdominal discomfort nausea vomiting diarrhea or constipation.  GU does not complain of dysuria.  Musculoskeletal has lower extremity weakness at baseline at this point does not complain of pain.  Neurologic does not complain of dizziness headache does complain at times of neuropathic pain of her legs she does have Neurontin and Cymbalta as she says the addition of Cymbalta has helped some.  Psych does not complain of overt depression or anxiety.  Physical exam.  Temperature is 97.9 pulse 84  respirations 18 blood pressure 142/80-109/53-oxygen saturation is 96%.  General this is a pleasant elderly female in no distress lying comfortably in bed.  Her skin is warm and dry..  Eyes visual acuity appears intact sclera and conjunctive are clear.  Oropharynx is clear mucous membranes moist.  Chest is clear to auscultation with somewhat shallow air entry there is no labored breathing.  Heart is regular rate and rhythm without murmur gallop or rub she has moderate lower extremity edema which appears to be baseline possibly slightly improved from previous exam.  Abdomen is obese soft nontender with positive bowel sounds.  Musculoskeletal does move upper extremities at baseline with continued lower extremity weakness.  Neurologic appears grossly intact her speech is clear could not appreciate lateralizing findings.  Psych she is grossly alert and oriented very pleasant and appropriate.  Labs.  October 16, 2018.  Vitamin D level 40.43.  October 06, 2018.  Sodium 141 potassium 4.5 BUN 31.4 creatinine 1.07.  Liver function tests within normal limits.  WBC 6.6 hemoglobin 11.0 platelets 193.  TSH 2.23.  Hemoglobin A1c was 9.  Total cholesterol was 155 and LDL was 81    Assessment and plan.  1.  Hypertension at this point appears to be stable systolics range from 10 9-1 42 recently-continues on lisinopril 2.5 mg a day and Lopressor 12.5 mg twice daily.  2.  Diabetes type 2 this continues to be a challenge-this was discussed with Dr. Sheppard Coil and will order an endocrinology consult-at this point continue Lantus 24 units a day with Humalog sliding scale-there are concerns with possible hypoglycemia overnight so there has been some hesitancy to be real aggressive with the Lantus but will await endocrinology input on this.  3.  History of chronic kidney disease has been stable with a creatinine of 1.07 on lab done in September will have this updated.  4.  History of  diabetic neuropathy she is on Cymbalta 60 mg a day in addition to Neurontin 600 mg 3 times daily-at times she will still complain of pain but apparently the addition of Cymbalta has been helping at this point will monitor.  5.  History of osteoarthritis she is on Tylenol 1000 mg 2 times daily her pain appears to be more neuropathic at times.  6.-History of lower extremity edema at this point appears baseline on Lasix 20 mg a day with potassium supplementation.  7.  History of anemia she continues on iron 325 mg a day hemoglobin was 11.0 on recent lab which shows stability.  8.  History of pulmonary embolism in the past she is on aspirin 81 mg a day there has been no reoccurrence.  9.  History of allergic rhinitis she continues on Claritin 10 mg a day as well as Flonase nightly.  10.  History of GERD she continues on Protonix 40 mg a day this appears to be stable.  Will update lab work including a CBC and metabolic panel first laboratory day next week.  Again she will need an endocrinology consult secondary to difficult to control diabetes.  [id number]

## 2018-12-03 ENCOUNTER — Encounter: Payer: Self-pay | Admitting: Internal Medicine

## 2018-12-04 ENCOUNTER — Encounter: Payer: Self-pay | Admitting: Internal Medicine

## 2018-12-05 ENCOUNTER — Encounter: Payer: Self-pay | Admitting: Internal Medicine

## 2018-12-07 LAB — CBC AND DIFFERENTIAL
HCT: 36 (ref 36–46)
Hemoglobin: 11.2 — AB (ref 12.0–16.0)
Neutrophils Absolute: 3
Platelets: 197 (ref 150–399)
WBC: 5.9

## 2018-12-07 LAB — COMPREHENSIVE METABOLIC PANEL
Calcium: 9.4 (ref 8.7–10.7)
GFR calc Af Amer: 64.66
GFR calc non Af Amer: 55.79

## 2018-12-07 LAB — BASIC METABOLIC PANEL
BUN: 25 — AB (ref 4–21)
CO2: 27 — AB (ref 13–22)
Chloride: 102 (ref 99–108)
Creatinine: 1 (ref 0.5–1.1)
Glucose: 187
Potassium: 4.5 (ref 3.4–5.3)
Sodium: 145 (ref 137–147)

## 2018-12-07 LAB — CBC: RBC: 4.3 (ref 3.87–5.11)

## 2018-12-11 ENCOUNTER — Non-Acute Institutional Stay (SKILLED_NURSING_FACILITY): Payer: Medicare Other | Admitting: Internal Medicine

## 2018-12-11 ENCOUNTER — Encounter: Payer: Self-pay | Admitting: Internal Medicine

## 2018-12-11 DIAGNOSIS — E1165 Type 2 diabetes mellitus with hyperglycemia: Secondary | ICD-10-CM | POA: Diagnosis not present

## 2018-12-11 DIAGNOSIS — E119 Type 2 diabetes mellitus without complications: Secondary | ICD-10-CM

## 2018-12-11 NOTE — Progress Notes (Signed)
Location:   [facility]:   SNF  Hennie Duos, MD  Patient Care Team: Hennie Duos, MD as PCP - General (Internal Medicine)  Extended Emergency Contact Information Primary Emergency Contact: Marshall Cork Address: [address].          [place], [place] [address] Johnnette Litter of [place] Phone: [phone] Relation: Son Preferred language: English Interpreter needed? No Secondary Emergency Contact: Oren Binet Home Phone: [phone] Relation: Sister    Allergies: Patient has no known allergies.  Chief Complaint  Patient presents with  . Acute Visit    HPI: Patient is 73 y.o. female who is being seen for diabetes mellitus type 2 with poor blood sugar control.  With the patient's blood sugars several weeks ago and again this week.  Patient has no complaints.  Past Medical History:  Diagnosis Date  . Arthritis   . Depression   . Hypertension   . Pulmonary embolism [facility])     Past Surgical History:  Procedure Laterality Date  . ABDOMINAL HYSTERECTOMY    . BACK SURGERY    . REPLACEMENT TOTAL KNEE BILATERAL      Allergies as of 12/11/2018   No Known Allergies     Medication List       Accurate as of December 11, 2018 11:59 PM. If you have any questions, ask your nurse or doctor.        acetaminophen 325 MG tablet Commonly known as: TYLENOL Take 650 mg by mouth every 6 (six) hours as needed for mild pain or fever.   acetaminophen 500 MG tablet Commonly known as: TYLENOL Take 1,000 mg by mouth 2 (two) times daily.   albuterol 108 (90 Base) MCG/ACT inhaler Commonly known as: VENTOLIN HFA Inhale 2 puffs into the lungs 3 (three) times daily.   aspirin EC 81 MG tablet Take 81 mg by mouth daily.   Claritin 10 MG tablet Generic drug: loratadine Take 10 mg by mouth daily.   DULoxetine 60 MG capsule Commonly known as: CYMBALTA Take 60 mg by mouth daily.   ferrous sulfate 325 (65 FE) MG tablet Take 325 mg by  mouth daily with breakfast.   Flonase Allergy Relief 50 MCG/ACT nasal spray Generic drug: fluticasone 2 Sprays by Nasal route nightly to relieve seasonal allergy   furosemide 20 MG tablet Commonly known as: LASIX Take 20 mg by mouth daily.   gabapentin 600 MG tablet Commonly known as: NEURONTIN Take 600 mg by mouth 3 (three) times daily.   Glucerna Liqd Take 237 mLs by mouth 2 (two) times daily between meals.   HumaLOG KwikPen 100 UNIT/ML KwikPen Generic drug: insulin lispro CBG AC with SSI 70- 120 = 0 unit 121-150=3 units,151-200=3 units, 201-250=7 units, 251-300=11 units, 301-350=15 units, 351-400=20 units, greater than 400 call MD and give 20 units   insulin glargine 100 UNIT/ML injection Commonly known as: LANTUS Inject 24 Units into the skin daily.   lisinopril 2.5 MG tablet Commonly known as: ZESTRIL Take 2.5 mg by mouth daily.   metoprolol tartrate 25 MG tablet Commonly known as: LOPRESSOR Take 12.5 mg by mouth 2 (two) times daily. 0.5 tablet to = 12.5 mg   pantoprazole 40 MG tablet Commonly known as: PROTONIX Take 40 mg by mouth daily.   polyethylene glycol 17 g packet Commonly known as: MIRALAX / GLYCOLAX Take 17 g by mouth daily.   potassium chloride 10 MEQ tablet Commonly known as: KLOR-CON Take 10 mEq by mouth  daily.   PREVIDENT 5000 BOOSTER DT Place 1 application onto teeth daily. Brush on teeth with toothbrush after PM mouth care, spit out excess, do not rinse   promethazine 25 MG tablet Commonly known as: PHENERGAN Take 25 mg by mouth every 6 (six) hours as needed for nausea or vomiting.       No orders of the defined types were placed in this encounter.   Immunization History  Administered Date(s) Administered  . Influenza-Unspecified 09/07/2016, 09/25/2017, 10/10/2018  . Pneumococcal Polysaccharide-23 11/25/2017    Social History   Tobacco Use  . Smoking status: Former Smoker    Years: 55.00    Quit date: 04/2016    Years since  quitting: 2.7  . Smokeless tobacco: Never Used  Substance Use Topics  . Alcohol use: No    Review of Systems  GENERAL:  no fevers, fatigue, appetite changes SKIN: No itching, rash HEENT: No complaint RESPIRATORY: No cough, wheezing, SOB CARDIAC: No chest pain, palpitations, lower extremity edema  GI: No abdominal pain, No N/V/D or constipation, No heartburn or reflux  GU: No dysuria, frequency or urgency, or incontinence  MUSCULOSKELETAL: No unrelieved bone/joint pain NEUROLOGIC: No headache, dizziness  PSYCHIATRIC: No overt anxiety or sadness  Vitals:   12/11/18 1554  BP: 130/75  Pulse: 85  Resp: 19  Temp: 97.7 F (36.5 C)   Body mass index is 43.08 kg/m. Physical Exam  GENERAL APPEARANCE: Alert, conversant, No acute distress  SKIN: No diaphoresis rash HEENT: Unremarkable RESPIRATORY: Breathing is even, unlabored. Lung sounds are clear   CARDIOVASCULAR: Heart RRR no murmurs, rubs or gallops. No peripheral edema  GASTROINTESTINAL: Abdomen is soft, non-tender, not distended w/ normal bowel sounds.  GENITOURINARY: Bladder non tender, not distended  MUSCULOSKELETAL: No abnormal joints or musculature NEUROLOGIC: Cranial nerves 2-12 grossly intact. Moves all extremities PSYCHIATRIC: Mood and affect appropriate to situation, no behavioral issues  Patient Active Problem List   Diagnosis Date Noted  . COVID-19 virus infection 07/29/2018  . Hyperosmolar hyperglycemic coma due to diabetes mellitus without ketoacidosis (HCC) 02/21/2018  . New onset type 2 diabetes mellitus (HCC) 02/21/2018  . Acute renal failure superimposed on stage 3 chronic kidney disease (HCC) 02/21/2018  . Hypernatremia 02/21/2018  . Hypokalemia 02/21/2018  . Decubitus ulcer of coccygeal region, stage 2 (HCC) 02/21/2018  . GERD (gastroesophageal reflux disease) 12/21/2017  . Declining functional status 08/18/2017  . Decreased functional mobility 08/18/2017  . Chronic kidney disease (CKD) 06/02/2017  .  Arthritis 06/02/2017  . GI bleed 02/15/2017  . Acute blood loss anemia 02/15/2017  . Escherichia coli urinary tract infection 02/15/2017  . Hypoxia 02/15/2017  . Edema of upper extremity 02/15/2017  . Bilateral lower extremity edema 02/15/2017  . Pulmonary embolism (HCC) 02/08/2017  . Hypertension 02/08/2017  . Acute kidney injury superimposed on chronic kidney disease (HCC) 02/08/2017  . Polyneuropathy 02/08/2017  . Depression 02/08/2017    CMP     Component Value Date/Time   NA 141 10/06/2018 0000   K 4.5 10/06/2018 0000   CL 96 (A) 10/06/2018 0000   CO2 32 (A) 10/06/2018 0000   BUN 31 (A) 10/06/2018 0000   CREATININE 1.1 10/06/2018 0000   CALCIUM 9.6 10/06/2018 0000   ALBUMIN 4.1 10/06/2018 0000   AST 14 10/06/2018 0000   ALT 19 10/06/2018 0000   ALKPHOS 86 10/06/2018 0000   GFRNONAA 48.38 10/06/2018 0000   GFRAA 56.07 10/06/2018 0000   Recent Labs    05/26/18 0000 07/15/18 0000 10/06/18 0000  NA 144 142 141  K 4.4 4.6 4.5  CL  --   --  96*  CO2  --   --  32*  BUN 33* 27* 31*  CREATININE 0.9 1.1 1.1  CALCIUM  --   --  9.6   Recent Labs    05/26/18 0000 07/15/18 0000 10/06/18 0000  AST 18 19 14   ALT 23 15 19   ALKPHOS 101 80 86  ALBUMIN  --   --  4.1   Recent Labs    05/26/18 0000 07/15/18 0000 10/06/18 0000  WBC 5.8 3.5 6.6  NEUTROABS 4 2 4   HGB 10.9* 11.3* 11.0*  HCT 34* 35* 36  PLT 207 167 193   Recent Labs    05/05/18 0000 05/26/18 0000 10/06/18 0000  CHOL 144 156 155  LDLCALC 80 95 81  TRIG 172* 118 131   No results found for: [facility] Lab Results  Component Value Date   TSH 2.23 10/06/2018   Lab Results  Component Value Date   HGBA1C 9.0 10/06/2018   Lab Results  Component Value Date   CHOL 155 10/06/2018   HDL 48 10/06/2018   LDLCALC 81 10/06/2018   TRIG 131 10/06/2018    Significant Diagnostic Results in last 30 days:  No results found.  Assessment and Plan  Diabetes mellitus type 2, uncontrolled-reviewed which  worsen records).  Patient's morning blood sugars are higher given some leeway for increasing long-acting insulin.  Increasing Lantus from 24 units to 27 units and starting Humalog 4 units with every meal with a resistant sliding scale for blood sugar checks after that meal; will monitor response.    Margit HanksAnne D Alexander, MD

## 2018-12-19 ENCOUNTER — Encounter: Payer: Self-pay | Admitting: Internal Medicine

## 2018-12-19 DIAGNOSIS — E1165 Type 2 diabetes mellitus with hyperglycemia: Secondary | ICD-10-CM | POA: Insufficient documentation

## 2018-12-19 DIAGNOSIS — IMO0002 Reserved for concepts with insufficient information to code with codable children: Secondary | ICD-10-CM | POA: Insufficient documentation

## 2018-12-28 ENCOUNTER — Encounter: Payer: Self-pay | Admitting: Internal Medicine

## 2018-12-28 NOTE — Progress Notes (Signed)
This encounter was created in error - please disregard.

## 2018-12-30 ENCOUNTER — Non-Acute Institutional Stay (SKILLED_NURSING_FACILITY): Payer: Medicare Other | Admitting: Internal Medicine

## 2018-12-30 DIAGNOSIS — N183 Chronic kidney disease, stage 3 unspecified: Secondary | ICD-10-CM

## 2018-12-30 DIAGNOSIS — E1165 Type 2 diabetes mellitus with hyperglycemia: Secondary | ICD-10-CM | POA: Diagnosis not present

## 2018-12-30 DIAGNOSIS — I10 Essential (primary) hypertension: Secondary | ICD-10-CM | POA: Diagnosis not present

## 2018-12-30 DIAGNOSIS — I2699 Other pulmonary embolism without acute cor pulmonale: Secondary | ICD-10-CM

## 2018-12-30 DIAGNOSIS — G629 Polyneuropathy, unspecified: Secondary | ICD-10-CM | POA: Diagnosis not present

## 2018-12-30 DIAGNOSIS — R600 Localized edema: Secondary | ICD-10-CM | POA: Diagnosis not present

## 2018-12-31 LAB — BASIC METABOLIC PANEL
BUN: 28 — AB (ref 4–21)
CO2: 30 — AB (ref 13–22)
Chloride: 102 (ref 99–108)
Creatinine: 0.9 (ref 0.5–1.1)
Glucose: 174
Potassium: 4.5 (ref 3.4–5.3)
Sodium: 143 (ref 137–147)

## 2018-12-31 LAB — COMPREHENSIVE METABOLIC PANEL
Calcium: 9.6 (ref 8.7–10.7)
GFR calc Af Amer: 72.08
GFR calc non Af Amer: 62.19

## 2018-12-31 LAB — HEMOGLOBIN A1C: Hemoglobin A1C: 10.7

## 2019-01-03 ENCOUNTER — Encounter: Payer: Self-pay | Admitting: Internal Medicine

## 2019-01-03 NOTE — Progress Notes (Signed)
This encounter was created in error - please disregard.  This encounter was created in error - please disregard.

## 2019-01-03 NOTE — Progress Notes (Signed)
Is this is routine visit.  Level care skilled.  [facility]  Chief complaint routine visit for medical management of chronic medical conditions including hypertension-chronic kidney disease-type 2 diabetes-neuropathy-osteoarthritis-history of pulmonary embolism as well as lower extremity edema and anemia.  History of present illness.  Patient is a pleasant 73 year old female with the above diagnosis.  Nursing does not report any recent acute issues.  Dr. Sheppard Coil continues to follow her blood sugars secondary to elevated readings-this has been complicated at times with risk for hypoglycemia-her Lantus recently has been increased up to 27 units she is also on Humalog sliding scale with an additional 4 units prescribed in addition to the sliding scale.  Her blood sugars continue to be somewhat variable-recently ranging from the a the mid 100s to lower 200s with occasional spikes into the 300 range  Her A1c was 9.0 back in September-she also has a history of hypertension this appears to be stable on lisinopril 2.5 mg a day as well as Lopressor 12.5 mg a day she is also on Lasix 20 mg a day-recent blood pressures 140/80-117/73-102/66-there is some variability but do not see consistent highs.  In regards to chronic kidney disease her creatinine was 0.95 on lab done on November 30 this appears relatively stable.  She also has diabetic neuropathy and continues on Neurontin 600 mg 3 times a day this is helping her patient Cymbalta also was added secondary to complaints of neuropathic discomfort.  Regard to lower extremity edema she is on Lasix 20 mg a day with potassium supplementation-this appears to be relatively stable per assessment today possibly slightly improved.  She continues to be very pleasant does not really have any acute complaints today   Past Medical History:  Diagnosis Date  . Arthritis   . Depression   . Hypertension   . Pulmonary embolism [facility])           Past Surgical History:  Procedure Laterality Date  . ABDOMINAL HYSTERECTOMY    . BACK SURGERY    . REPLACEMENT TOTAL KNEE BILATERAL      Allergies as of 12/01/2018  No Known Allergies                 Medication List                       acetaminophen325 MG tablet Commonly known as: TYLENOL Take 650 mg by mouth every 6 (six) hours as needed for mild pain or fever.     acetaminophen500 MG tablet Commonly known as: TYLENOL Take 1,000 mg by mouth 2 (two) times daily.     albuterol108 (90 Base) MCG/ACT inhaler Commonly known as: VENTOLIN HFA Inhale 2 puffs into the lungs 3 (three) times daily.     aspirin EC81 MG tablet Take 81 mg by mouth daily.     Claritin10 MG tablet Generic drug: loratadine Take 10 mg by mouth daily.     DULoxetine60 MG capsule Commonly known as: CYMBALTA Take 60 mg by mouth daily.     ferrous sulfate325 (65 FE) MG tablet Take 325 mg by mouth daily with breakfast.     Flonase Allergy Relief50 MCG/ACT nasal spray Generic drug: fluticasone 2 Sprays by Nasal route nightly to relieve seasonal allergy     furosemide20 MG tablet Commonly known as: LASIX Take 20 mg by mouth daily.     gabapentin600 MG tablet Commonly known as: NEURONTIN Take 600 mg by mouth 3 (three) times daily.  GlucernaLiqd Take 237 mLs by mouth 2 (two) times daily between meals.     HumaLOG KwikPen100 UNIT/ML KwikPen Generic drug: insulin lispro CBG AC with SSI 70- 120 = 0 unit 121-150=3 units,151-200=3 units, 201-250=7 units, 251-300=11 units, 301-350=15 units, 351-400=20 units, greater than 400 call MD and give 20 units  Also orders for an additional 4 units in addition to the resistant sliding scale     insulin glargine100 UNIT/ML injection Commonly known as: LANTUS Inject 27 Units into the skin daily.     lisinopril2.5 MG tablet Commonly known as: ZESTRIL Take 2.5 mg by mouth daily.      metoprolol tartrate25 MG tablet Commonly known as: LOPRESSOR Take 12.5 mg by mouth 2 (two) times daily. 0.5 tablet to = 12.5 mg     pantoprazole40 MG tablet Commonly known as: PROTONIX Take 40 mg by mouth daily.     polyethylene glycol17 g packet Commonly known as: MIRALAX / GLYCOLAX Take 17 g by mouth daily.     potassium chloride10 MEQ tablet Commonly known as: KLOR-CON Take 10 mEq by mouth daily.     PREVIDENT 5000 BOOSTER DT Place 1 application onto teeth daily. Brush on teeth with toothbrush after PM mouth care, spit out excess, do not rinse     promethazine25 MG tablet Commonly known as: PHENERGAN Take 25 mg by mouth every 6 (six) hours as needed for nausea or vomiting.           No orders of the defined types were placed in this encounter.       Immunization History  Administered Date(s) Administered  . Influenza-Unspecified 09/07/2016, 09/25/2017, 10/10/2018  . Pneumococcal Polysaccharide-23 11/25/2017    Social History        Tobacco Use  . Smoking status: Former Smoker    Years: 55.00    Quit date: 04/2016    Years since quitting: 2.6  . Smokeless tobacco: Never Used  Substance Use Topics  . Alcohol use: No    Frequency: Never   .  Review of systems.  In general she is not complaining of any fever or chills.  Skin does not complain of rashes or itching.  Head ears eyes nose mouth and throat does not complain of visual changes or sore throat.  Respiratory is not complaining of being short of breath or having cough she does have oxygen chronically.  Cardiac does not complain of chest pain or increased edema from baseline.  GI is not complaining of abdominal discomfort nausea vomiting diarrhea constipation.  GU is not complaining of dysuria.  Musculoskeletal continues with lower extremity weakness at baseline does not complain of pain.  Neurologic does not complain of headache or dizziness or syncope-does  have a history of lower extremity neuropathy she says the Neurontin and Cymbalta seem to help.  Psych she does not complain of being overtly depressed or anxious.   Physical exam.  Temperature is 98.9 pulse of 80 respirations 18 blood pressure 140/80.  In general this is a pleasant elderly female no distress lying comfortably in bed.  Her skin is warm and dry.  Eyes sclera and conjunctive appear to be clear visual acuity intact.  Oropharynx is clear mucous membranes moist.  Chest is clear to auscultation with somewhat shallow air entry and this is baseline.  Heart is regular rate and rhythm she has baseline possibly slightly improved lower extremity edema bilaterally.  Abdomen is obese soft nontender with positive bowel sounds.  Musculoskeletal Limited exam since she is in  bed but she does move her upper extremities at baseline and has lower extremity weakness.  Neurologic appears grossly intact her speech is clear could not appreciate lateralizing findings.  Psych she continues to be very pleasant and appropriate.  Labs.  December 07, 2018.  WBC 5.9 hemoglobin 11.2 platelets 197.  Sodium 145 potassium 4.5 BUN 25.2 creatinine 0.95.  October 06, 2018.  Liver function tests within normal limits.  LDL was 48.  Hemoglobin A1c 9.0.  Assessment and plan.  1.  Hypertension at this point appears to be stable she has some variability as noted above but does not see consistent highs or lows-she continues on lisinopril 2.5 mg a day and Lopressor 12.5 mg twice a day she also is on Lasix 20 mg a day.  2.  History of chronic kidney disease this appears stable with recent creatinine of 0.5-will update this but this appears to be stable.  3.  History of diabetic neuropathy she is on Cymbalta 60 mg a day Neurontin 600 mg 3 times daily-patient states this is helping although at times continues to be a challenge.  4.  Diabetes type 2-as noted above Lantus was recently increased up  to 27 units and Humalog 4 units added at meals with a resistant sliding scale in place as well-blood sugars continue to be variable and at times elevated-will update a hemoglobin A1c-this has been complicated with risk for hypoglycemia-at this point will monitor and I suspect will need frequent readdressing.  5.  History of osteoarthritis she continues on Tylenol 1000 mg twice daily she does not appear to be uncomfortable today and does not really complain of pain in this regards.  6.  History of anemia she is on iron 325 mg a day this appears to be stable with a recent hemoglobin of 11.2.  7.  History of pulmonary embolism she is on aspirin 81 mg a day she does not show evidence of any reoccurrence.  8.  History of GERD she is on Protonix 40 mg a day she has no complaints in that regard today.  9.  History of allergic rhinitis she continues on Flonase nightly she is also on Claritin 10 mg a day this appears to be well controlled.  [id number]

## 2019-01-05 ENCOUNTER — Encounter: Payer: Self-pay | Admitting: Internal Medicine

## 2019-01-30 LAB — HEMOGLOBIN A1C: Hemoglobin A1C: 9.7

## 2019-02-02 ENCOUNTER — Non-Acute Institutional Stay (SKILLED_NURSING_FACILITY): Payer: Medicare Other | Admitting: Internal Medicine

## 2019-02-02 ENCOUNTER — Encounter: Payer: Self-pay | Admitting: Internal Medicine

## 2019-02-02 DIAGNOSIS — E119 Type 2 diabetes mellitus without complications: Secondary | ICD-10-CM | POA: Diagnosis not present

## 2019-02-02 DIAGNOSIS — K219 Gastro-esophageal reflux disease without esophagitis: Secondary | ICD-10-CM

## 2019-02-02 DIAGNOSIS — I10 Essential (primary) hypertension: Secondary | ICD-10-CM

## 2019-02-02 NOTE — Progress Notes (Signed)
Location:  [facility] Room Number: [phone] Place of Service:  SNF (31)  [name], MD  Patient Care Team: [name], MD as PCP - General (Internal Medicine)  Extended Emergency Contact Information Primary Emergency Contact: Laney Potash Address: [address]          [place], [place] [address] Darden Amber of [place] Home Phone: [phone] Relation: Son Preferred language: English Interpreter needed? No Secondary Emergency Contact: Marcille Buffy Home Phone: [phone] Relation: Sister   Chief Complaint  Patient presents with  . Medical Management of Chronic Issues    Routine Visit    HPI:  Pt is a 74 y.o. female for routine issues of hypertension, GERD, and diabetes mellitus type 2 uncontrolled.   Past Medical History:  Diagnosis Date  . Arthritis   . Depression   . Hypertension   . Pulmonary embolism [facility])    Past Surgical History:  Procedure Laterality Date  . ABDOMINAL HYSTERECTOMY    . BACK SURGERY    . REPLACEMENT TOTAL KNEE BILATERAL      No Known Allergies  Allergies as of 02/02/2019   No Known Allergies     Medication List       Accurate as of February 02, 2019 11:59 PM. If you have any questions, ask your nurse or doctor.        acetaminophen 325 MG tablet Commonly known as: TYLENOL Take 650 mg by mouth every 6 (six) hours as needed for mild pain or fever.   acetaminophen 500 MG tablet Commonly known as: TYLENOL Take 1,000 mg by mouth 2 (two) times daily.   albuterol 108 (90 Base) MCG/ACT inhaler Commonly known as: VENTOLIN HFA Inhale 2 puffs into the lungs 3 (three) times daily.   ascorbic acid 500 MG tablet Commonly known as: VITAMIN C Take 500 mg by mouth daily.   aspirin EC 81 MG tablet Take 81 mg by mouth daily.   Claritin 10 MG tablet Generic drug: loratadine Take 10 mg by mouth daily.   DULoxetine 60 MG capsule Commonly known as: CYMBALTA Take 60 mg by mouth  daily.   ferrous sulfate 325 (65 FE) MG tablet Take 325 mg by mouth daily with breakfast.   Flonase Allergy Relief 50 MCG/ACT nasal spray Generic drug: fluticasone 2 Sprays by Nasal route nightly to relieve seasonal allergy   furosemide 20 MG tablet Commonly known as: LASIX Take 20 mg by mouth daily.   gabapentin 600 MG tablet Commonly known as: NEURONTIN Take 600 mg by mouth 3 (three) times daily.   Glucerna Liqd Take 237 mLs by mouth 2 (two) times daily between meals.   HumaLOG KwikPen 100 UNIT/ML KwikPen Generic drug: insulin lispro CBG AC with SSI 70- 120 = 0 unit 121-150=3 units,151-200=3 units, 201-250=7 units, 251-300=11 units, 301-350=15 units, 351-400=20 units, greater than 400 call MD and give 20 units   HumaLOG KwikPen 100 UNIT/ML KwikPen Generic drug: insulin lispro Inject 4 Units into the skin 3 (three) times daily. Give with resistant SSI after meals   insulin glargine 100 UNIT/ML injection Commonly known as: LANTUS Inject 27 Units into the skin daily.   lisinopril 2.5 MG tablet Commonly known as: ZESTRIL Take 2.5 mg by mouth daily.   metoprolol tartrate 25 MG tablet Commonly known as: LOPRESSOR Take 12.5 mg by mouth 2 (two) times daily. 0.5 tablet to = 12.5 mg   pantoprazole 40 MG tablet Commonly known as: PROTONIX Take 40 mg by mouth  daily.   polyethylene glycol 17 g packet Commonly known as: MIRALAX / GLYCOLAX Take 17 g by mouth daily.   potassium chloride 10 MEQ tablet Commonly known as: KLOR-CON Take 10 mEq by mouth daily.   PREVIDENT 5000 BOOSTER DT Place 1 application onto teeth daily. Brush on teeth with toothbrush after PM mouth care, spit out excess, do not rinse   promethazine 25 MG tablet Commonly known as: PHENERGAN Take 25 mg by mouth every 6 (six) hours as needed for nausea or vomiting.   Vitamin D3 1.25 MG (50000 UT) Caps Take 1 capsule by mouth. 3 x monthly   zinc sulfate 220 (50 Zn) MG capsule Take 220 mg by mouth every  other day.       Review of Systems  GENERAL:  no fevers, fatigue, appetite changes SKIN: No itching, rash HEENT: No complaint RESPIRATORY: No cough, wheezing, SOB CARDIAC: No chest pain, palpitations, lower extremity edema  GI: No abdominal pain, No N/V/D or constipation, No heartburn or reflux  GU: No dysuria, frequency or urgency, or incontinence  MUSCULOSKELETAL: No unrelieved bone/joint pain NEUROLOGIC: No headache, dizziness  PSYCHIATRIC: No overt anxiety or sadness   Immunization History  Administered Date(s) Administered  . Influenza-Unspecified 09/07/2016, 09/25/2017, 10/10/2018  . Pneumococcal Polysaccharide-23 11/25/2017   Pertinent  Health Maintenance Due  Topic Date Due  . PNA vac Low Risk Adult (2 of 2 - PCV13) 11/26/2018  . FOOT EXAM  01/06/2019  . DEXA SCAN  10/08/2019 (Originally 07/06/2001)  . OPHTHALMOLOGY EXAM  11/24/2019 (Originally May 15, 1945)  . HEMOGLOBIN A1C  07/01/2019  . INFLUENZA VACCINE  Completed   Fall Risk  11/21/2017  Falls in the past year? 0  Number falls in past yr: 0  Injury with Fall? 0    Vitals:   02/02/19 1213  BP: 110/69  Pulse: 87  Resp: 20  Temp: (!) 97.1 F (36.2 C)  TempSrc: Oral  SpO2: 97%  Weight: 223 lb 12.8 oz (101.5 kg)  Height: 5' (1.524 m)   Body mass index is 43.71 kg/m.  Physical Exam  GENERAL APPEARANCE: Alert, conversant, No acute distress  SKIN: No diaphoresis rash HEENT: Unremarkable RESPIRATORY: Breathing is even, unlabored. Lung sounds are clear   CARDIOVASCULAR: Heart RRR no murmurs, rubs or gallops. No peripheral edema  GASTROINTESTINAL: Abdomen is soft, non-tender, not distended w/ normal bowel sounds.  GENITOURINARY: Bladder non tender, not distended  MUSCULOSKELETAL: No abnormal joints or musculature NEUROLOGIC: Cranial nerves 2-12 grossly intact. Moves all extremities PSYCHIATRIC: Mood and affect appropriate to situation, no behavioral issues  Labs reviewed: Recent Labs     10/06/18 0000 12/07/18 0000 12/31/18 0000  NA 141  141 145 143  K 4.5  4.5 4.5 4.5  CL 96*  96* 102 102  CO2 32*  32* 27* 30*  BUN 31*  31* 25* 28*  CREATININE 1.1  1.1 1.0 0.9  CALCIUM 9.6  9.6 9.4 9.6   Recent Labs    05/26/18 0000 07/15/18 0000 10/06/18 0000  AST 18 19 14  14   ALT 23 15 19  19   ALKPHOS 101 80 86  86  ALBUMIN  --   --  4.1  4.1   Recent Labs    07/15/18 0000 10/06/18 0000 12/07/18 0000  WBC 3.5 6.6  6.6 5.9  NEUTROABS 2 4  4 3   HGB 11.3* 11.0*  11.0* 11.2*  HCT 35* 36  36 36  PLT 167 193  193 197   Recent Labs  05/05/18 0000 05/26/18 0000 10/06/18 0000  CHOL 144 156 155  155  LDLCALC 80 95 81  81  TRIG 172* 118 131  131   No results found for: MICROALBUR Lab Results  Component Value Date   TSH 2.23 10/06/2018   TSH 2.23 10/06/2018   Lab Results  Component Value Date   HGBA1C 10.7 12/31/2018   Lab Results  Component Value Date   CHOL 155 10/06/2018   CHOL 155 10/06/2018   HDL 48 10/06/2018   HDL 48 10/06/2018   LDLCALC 81 10/06/2018   LDLCALC 81 10/06/2018   TRIG 131 10/06/2018   TRIG 131 10/06/2018    Significant Diagnostic Results in last 30 days:  No results found.  Assessment/Plan New onset type 2 diabetes mellitus (HCC) New A1c is 9.3 which is where it has been prior.  Patient is on resistant sliding scale insulin and long-acting insulin; will consult endocrinology, which introduced evaluated family done electronically  GERD-continues without problems; continue Protonix 40 mg daily  Hypertension-controlled; continue Lasix 20 mg daily   Trong Gosling D. Sheppard Coil, MD

## 2019-02-07 ENCOUNTER — Encounter: Payer: Self-pay | Admitting: Internal Medicine

## 2019-02-07 NOTE — Assessment & Plan Note (Signed)
New A1c is 9.3 which is where it has been prior.  Patient is on resistant sliding scale insulin and long-acting insulin; will consult endocrinology, which introduced evaluated family done electronically

## 2019-02-24 ENCOUNTER — Non-Acute Institutional Stay (SKILLED_NURSING_FACILITY): Payer: Medicare Other | Admitting: Internal Medicine

## 2019-02-24 DIAGNOSIS — G629 Polyneuropathy, unspecified: Secondary | ICD-10-CM

## 2019-02-24 DIAGNOSIS — I10 Essential (primary) hypertension: Secondary | ICD-10-CM | POA: Diagnosis not present

## 2019-02-24 DIAGNOSIS — E1165 Type 2 diabetes mellitus with hyperglycemia: Secondary | ICD-10-CM | POA: Diagnosis not present

## 2019-02-24 DIAGNOSIS — K5900 Constipation, unspecified: Secondary | ICD-10-CM

## 2019-02-24 DIAGNOSIS — M1990 Unspecified osteoarthritis, unspecified site: Secondary | ICD-10-CM

## 2019-02-24 DIAGNOSIS — I2699 Other pulmonary embolism without acute cor pulmonale: Secondary | ICD-10-CM

## 2019-02-27 ENCOUNTER — Encounter: Payer: Self-pay | Admitting: Internal Medicine

## 2019-02-27 NOTE — Progress Notes (Signed)
This is a routine visit.  Level of care scale.  [facility]ility [facility].  Chief complaint-routine visit for medical management of chronic medical issues including chronic kidney disease-hypertension-neuropathy with type 2 diabetes-osteoarthritis as well as history of lower extremity edema-anemia and history of pulmonary embolism.  History of present illness.  Patient is a pleasant 74 year old female with the above diagnoses.  She does not have any acute complaints today.  Her blood sugars continue to be challenging and she actually has an endocrinology follow-up scheduled for this Monday which is much appreciated.  She is on Lantus 27 units nightly as well as Humalog 4 units each meal with a sliding scale on top of that.  Her blood sugars continue to be somewhat elevated this morning it was 227 at noon 353 she does receive the aggressive Humalog sliding scale as well as the standard 4 units.  Her sugars range from 118 recently up to occasionally over 400.  There is concern for overnight hypoglycemia in the past which has led to somewhat conservative increases in her insulin.  Endocrinology follow-up will be most appreciated.  Clinically she appears to be stable she does have associated neuropathy and continues on Neurontin 600 mg 3 times daily and Cymbalta was added to help and she says this is helping some.  Her other medical issues appear to be stable and she does have a history of hypertensionShe is on lisinopril 2.5 mg a day Lopressor 12.5 mg twice daily as well as Lasix 20 mg a day.  Recent blood pressures 134/78-102/59-113/69-the highest one I see is 150/70 but this is not consistent.  In regards to chronic kidney disease this is been stable as well with a creatinine of 0.92 on lab done in late December.  Regards to anemia she continues on iron most recent hemoglobin A1c was 11.2 back in November we will update this.  Currently her only complaint is she does not feel she  has enough bowel movements she does have bowel movements but says they are not very productive at times-she is on MiraLAX as needed we will make this routine and monitor.   Past Medical History:  Diagnosis Date  . Arthritis   . Depression   . Hypertension   . Pulmonary embolism [facility])          Past Surgical History:  Procedure Laterality Date  . ABDOMINAL HYSTERECTOMY    . BACK SURGERY    . REPLACEMENT TOTAL KNEE BILATERAL      Allergies  No Known Allergies                 Medication List                       acetaminophen325 MG tablet Commonly known as: TYLENOL Take 650 mg by mouth every 6 (six) hours as needed for mild pain or fever.     acetaminophen500 MG tablet Commonly known as: TYLENOL Take 1,000 mg by mouth 2 (two) times daily.     albuterol108 (90 Base) MCG/ACT inhaler Commonly known as: VENTOLIN HFA Inhale 2 puffs into the lungs 3 (three) times daily.     aspirin EC81 MG tablet Take 81 mg by mouth daily.     Claritin10 MG tablet Generic drug: loratadine Take 10 mg by mouth daily.     DULoxetine60 MG capsule Commonly known as: CYMBALTA Take 60 mg by mouth daily.     ferrous sulfate325 (65 FE) MG tablet Take 325  mg by mouth daily with breakfast.     Flonase Allergy Relief50 MCG/ACT nasal spray Generic drug: fluticasone 2 Sprays by Nasal route nightly to relieve seasonal allergy     furosemide20 MG tablet Commonly known as: LASIX Take 20 mg by mouth daily.     gabapentin600 MG tablet Commonly known as: NEURONTIN Take 600 mg by mouth 3 (three) times daily.     GlucernaLiqd Take 237 mLs by mouth 2 (two) times daily between meals.     HumaLOG KwikPen100 UNIT/ML KwikPen Generic drug: insulin lispro CBG AC with SSI 70- 120 = 0 unit 121-150=3 units,151-200=3 units, 201-250=7 units, 251-300=11 units, 301-350=15 units, 351-400=20 units, greater than 400  call MD and give 20 units  Also orders for an additional 4 units in addition to the resistant sliding scale     insulin glargine100 UNIT/ML injection Commonly known as: LANTUS Inject 27 Units into the skin daily.     lisinopril2.5 MG tablet Commonly known as: ZESTRIL Take 2.5 mg by mouth daily.     metoprolol tartrate25 MG tablet Commonly known as: LOPRESSOR Take 12.5 mg by mouth 2 (two) times daily. 0.5 tablet to = 12.5 mg     pantoprazole40 MG tablet Commonly known as: PROTONIX Take 40 mg by mouth daily.     polyethylene glycol17 g packet Commonly known as: MIRALAX / GLYCOLAX Take 17 g by mouth daily.     potassium chloride10 MEQ tablet Commonly known as: KLOR-CON Take 10 mEq by mouth daily.     PREVIDENT 5000 BOOSTER DT Place 1 application onto teeth daily. Brush on teeth with toothbrush after PM mouth care, spit out excess, do not rinse     promethazine25 MG tablet Commonly known as: PHENERGAN Take 25 mg by mouth every 6 (six) hours as needed for nausea or vomiting.           No orders of the defined types were placed in this encounter.       Immunization History  Administered Date(s) Administered  . Influenza-Unspecified 09/07/2016, 09/25/2017, 10/10/2018  . Pneumococcal Polysaccharide-23 11/25/2017    Social History        Tobacco Use  . Smoking status: Former Smoker    Years: 55.00    Quit date: 04/2016    Years since quitting: 2.6  . Smokeless tobacco: Never Used  Substance Use Topics  . Alcohol use: No    Frequency: Never   . Review of systems.  In general she is not complaining of any fever or chills.  Skin does not complain of any rashes or itching.  Head ears eyes nose mouth and throat does not complain of visual changes or sore throat.  Respiratory no complaints of shortness of breath or cough been treated and started to separate  Cardiac does not complain of chest pain  has been chronic lower extremity edema which appears relatively unchanged.   GI Is not complaining of any abdominal discomfort nausea vomiting diarrhea does complain of not having any consistent bowel movements.  GU is not complaining of dysuria.  Musculoskeletal has weakness but does not complain of joint pain.  Neurologic does have history of neuropathy and is this time she still has some discomfort stinging but medications are helping.  Does not complain of dizziness syncope long or headaches.  Psych does not complain of being overtly depressed or anxious   Physical exam.  Temperature is 97.4 pulse 76 respirations 18 blood pressure 134/78 oxygen saturation is 96%.  General this is a  pleasant elderly female in no distress lying comfortably in bed.  Her skin is warm and dry.  Eyes sclera and conjunctive are clear visual acuity appears grossly intact.  Oropharynx clear mucous membranes moist.  Chest is clear to auscultation Really baseline shallow air entry there is no labored breathing.  Heart is regular rate and rhythm without murmur gallop or rub she has baseline lower extremity edema venous stasis changes.  Abdomen is obese soft nontender with positive bowel sounds.  Musculoskeletal continues to be a limited exam since she is in bed but does move her upper extremities at baseline continues with lower extremity weakness.  Neurologic as noted above her speech is clear could not appreciate lateralizing findings cranial nerves appear grossly intact.     psych she is pleasant and appropriate at baseline   LABS.  January 30, 2019.  Hemoglobin A1c 9.7.  December 31, 2018.  Sodium 143 potassium 4.5 BUN 28.2 creatinine 0.92.  December 07, 2018.  WBC 5.9 hemoglobin 11.2 platelets 197.  October 06, 2018.  Liver function tests within normal limits albumin was 4.1.  TSH was 2.23.  At that point hemoglobin A1c was 9.0.  Cholesterol is 155 LDL was  81.  Vitamin D level was 42.0 right  Assessment and plan.  1.  Hypertension this continues to be pretty stable with blood pressures as noted above do not seem consistent systolics above 140.  She is on lisinopril 2.5 mg a day-Lopressor 12.5 mg twice daily-and Lasix 20 mg a day  #2 history of chronic kidney disease this appears stable with most recent creatinine 0.92 BUN of 28.2 will update this.  3.  History of diabetes this continues to be challenging as noted above her Lantus has gradually been increased now 27 units a day she is also on Humalog 4 units at meals with a resistant sliding scale in place.  Continues with at times elevated blood sugars in  3- 400 range it was 227 this morning--this is complicated with concerns for possible hypoglycemia overnight.  Appreciate endocrinology follow-up which has been arranged for this Monday.  Clinically appears to be stable.  4.  History of neuropathy most likely diabetic related she is on Cymbalta 60 mg a day as well as Neurontin 600 mg 3 times daily-at this point will monitor she does get some relief with the medications.  5.  History of osteoarthritis she is on Tylenol 1000 mg twice a day and is not really complaining of joint pain today.  6.  History of anemia last hemoglobin was 11.2 which is relatively stable she is on iron 325 mg a day-we will have this updated as well.  7.  History of pulmonary embolism she continues on aspirin 81 mg a day she does not complain of any chest pain or shortness of breath.  8.  History of GERD she continues on Protonix 40 mg a day this appears stable as well.  9.  History of constipation she is on as needed MiraLAX will make this routine and monitor hold for diarrhea I did discuss this with nursing.  10.  History of allergic rhinitis she is on Claritin 10 mg a day as she also has orders for Flonase she does not really complain of symptoms today.  [id number]

## 2019-03-01 LAB — COMPREHENSIVE METABOLIC PANEL
Calcium: 9.7 (ref 8.7–10.7)
GFR calc Af Amer: 65.09
GFR calc non Af Amer: 56.16

## 2019-03-01 LAB — BASIC METABOLIC PANEL
BUN: 26 — AB (ref 4–21)
CO2: 29 — AB (ref 13–22)
Chloride: 99 (ref 99–108)
Creatinine: 1 (ref 0.5–1.1)
Glucose: 285
Potassium: 4.4 (ref 3.4–5.3)
Sodium: 141 (ref 137–147)

## 2019-03-01 LAB — CBC AND DIFFERENTIAL
HCT: 35 — AB (ref 36–46)
Hemoglobin: 10.7 — AB (ref 12.0–16.0)
Neutrophils Absolute: 4
Platelets: 184 (ref 150–399)
WBC: 6.4

## 2019-03-01 LAB — CBC: RBC: 4.12 (ref 3.87–5.11)

## 2019-03-05 LAB — VITAMIN D 25 HYDROXY (VIT D DEFICIENCY, FRACTURES): Vit D, 25-Hydroxy: 39

## 2019-03-16 ENCOUNTER — Non-Acute Institutional Stay (SKILLED_NURSING_FACILITY): Payer: Medicare Other | Admitting: Internal Medicine

## 2019-03-16 ENCOUNTER — Encounter: Payer: Self-pay | Admitting: Internal Medicine

## 2019-03-16 DIAGNOSIS — K219 Gastro-esophageal reflux disease without esophagitis: Secondary | ICD-10-CM

## 2019-03-16 DIAGNOSIS — I10 Essential (primary) hypertension: Secondary | ICD-10-CM

## 2019-03-16 DIAGNOSIS — E1165 Type 2 diabetes mellitus with hyperglycemia: Secondary | ICD-10-CM

## 2019-03-16 LAB — HM DIABETES FOOT EXAM

## 2019-03-16 NOTE — Progress Notes (Signed)
Location:  [facility] Room Number: 216-W Place of Service:  SNF ([id number])  Hennie Duos, MD  Patient Care Team: Hennie Duos, MD as PCP - General (Internal Medicine)  Extended Emergency Contact Information Primary Emergency Contact: Marshall Cork Address: [address]          [place], [place] [address] Johnnette Litter of [place] Phone: [phone] Relation: Son Preferred language: English Interpreter needed? No Secondary Emergency Contact: Oren Binet Home Phone: [phone] Relation: Sister    Allergies: Patient has no known allergies.  Chief Complaint  Patient presents with  . Medical Management of Chronic Issues    Routine [facility] visit    HPI: Patient is 74 y.o. female who is being seen for routine issues of hypertension, GERD, and diabetes mellitus type 2.  Past Medical History:  Diagnosis Date  . Arthritis   . Depression   . Hypertension   . Pulmonary embolism [facility])     Past Surgical History:  Procedure Laterality Date  . ABDOMINAL HYSTERECTOMY    . BACK SURGERY    . REPLACEMENT TOTAL KNEE BILATERAL      Allergies as of 03/16/2019   No Known Allergies     Medication List       Accurate as of March 16, 2019 11:59 PM. If you have any questions, ask your nurse or doctor.        acetaminophen 325 MG tablet Commonly known as: TYLENOL Take 650 mg by mouth every 6 (six) hours as needed for mild pain or fever.   acetaminophen 500 MG tablet Commonly known as: TYLENOL Take 1,000 mg by mouth 2 (two) times daily.   albuterol 108 (90 Base) MCG/ACT inhaler Commonly known as: VENTOLIN HFA Inhale 2 puffs into the lungs 3 (three) times daily.   aspirin EC 81 MG tablet Take 81 mg by mouth daily.   Claritin 10 MG tablet Generic drug: loratadine Take 10 mg by mouth daily.   DULoxetine 60 MG capsule Commonly known as: CYMBALTA Take 60 mg by mouth daily.   ferrous sulfate 325 (65 FE) MG  tablet Take 325 mg by mouth daily with breakfast.   Flonase Allergy Relief 50 MCG/ACT nasal spray Generic drug: fluticasone 2 Sprays by Nasal route nightly to relieve seasonal allergy   furosemide 20 MG tablet Commonly known as: LASIX Take 20 mg by mouth daily.   gabapentin 600 MG tablet Commonly known as: NEURONTIN Take 600 mg by mouth 3 (three) times daily.   Glucerna Liqd Take 237 mLs by mouth 2 (two) times daily between meals.   HumaLOG KwikPen 100 UNIT/ML KwikPen Generic drug: insulin lispro Inject into the skin 3 (three) times daily. SSI: 101-150 = 6 units, 151-200 = 8 units, 201-250 = 10 units, 251-300 = 14 units, 301-350 = 18 units, 351-400 = 22 units, greater than 400 = 26 units   HumaLOG KwikPen 100 UNIT/ML KwikPen Generic drug: insulin lispro Inject 4 Units into the skin 3 (three) times daily. Give with resistant SSI after meals   insulin glargine 100 UNIT/ML injection Commonly known as: LANTUS Inject 35 Units into the skin at bedtime.   lisinopril 2.5 MG tablet Commonly known as: ZESTRIL Take 2.5 mg by mouth daily.   metoprolol tartrate 25 MG tablet Commonly known as: LOPRESSOR Take 12.5 mg by mouth 2 (two) times daily. 0.5 tablet to = 12.5 mg   pantoprazole 40 MG tablet Commonly known as: PROTONIX Take 40 mg by  mouth daily.   polyethylene glycol 17 g packet Commonly known as: MIRALAX / GLYCOLAX Take 17 g by mouth daily.   potassium chloride 10 MEQ tablet Commonly known as: KLOR-CON Take 10 mEq by mouth daily.   PREVIDENT 5000 BOOSTER DT Place 1 application onto teeth daily. Brush on teeth with toothbrush after PM mouth care, spit out excess, do not rinse   promethazine 25 MG tablet Commonly known as: PHENERGAN Take 25 mg by mouth every 6 (six) hours as needed for nausea or vomiting.       No orders of the defined types were placed in this encounter.   Immunization History  Administered Date(s) Administered  . Influenza-Unspecified  09/07/2016, 09/25/2017, 10/10/2018  . Moderna SARS-COVID-2 Vaccination 01/07/2019, 02/04/2019  . Pneumococcal Polysaccharide-23 11/25/2017    Social History   Tobacco Use  . Smoking status: Former Smoker    Years: 55.00    Quit date: 04/2016    Years since quitting: 2.9  . Smokeless tobacco: Never Used  Substance Use Topics  . Alcohol use: No    Review of Systems  GENERAL:  no fevers, fatigue, appetite changes SKIN: No itching, rash HEENT: No complaint RESPIRATORY: No cough, wheezing, SOB CARDIAC: No chest pain, palpitations, lower extremity edema  GI: No abdominal pain, No N/V/D or constipation, No heartburn or reflux  GU: No dysuria, frequency or urgency, or incontinence  MUSCULOSKELETAL: No unrelieved bone/joint pain NEUROLOGIC: No headache, dizziness  PSYCHIATRIC: No overt anxiety or sadness  Vitals:   03/16/19 1225  BP: 131/78  Pulse: 72  Resp: 20  Temp: (!) 97.5 F (36.4 C)  SpO2: 98%   Body mass index is 43.57 kg/m. Physical Exam  GENERAL APPEARANCE: Alert, conversant, No acute distress  SKIN: No diaphoresis rash HEENT: Unremarkable RESPIRATORY: Breathing is even, unlabored. Lung sounds are clear   CARDIOVASCULAR: Heart RRR no murmurs, rubs or gallops. No peripheral edema  GASTROINTESTINAL: Abdomen is soft, non-tender, not distended w/ normal bowel sounds.  GENITOURINARY: Bladder non tender, not distended  MUSCULOSKELETAL: No abnormal joints or musculature NEUROLOGIC: Cranial nerves 2-12 grossly intact. Moves all extremities PSYCHIATRIC: Mood and affect appropriate to situation, no behavioral issues  Patient Active Problem List   Diagnosis Date Noted  . Diabetes mellitus type 2, uncontrolled (HCC) 12/19/2018  . COVID-19 virus infection 07/29/2018  . Hyperosmolar hyperglycemic coma due to diabetes mellitus without ketoacidosis (HCC) 02/21/2018  . New onset type 2 diabetes mellitus (HCC) 02/21/2018  . Acute renal failure superimposed on stage 3 chronic  kidney disease (HCC) 02/21/2018  . Hypernatremia 02/21/2018  . Hypokalemia 02/21/2018  . Decubitus ulcer of coccygeal region, stage 2 (HCC) 02/21/2018  . GERD (gastroesophageal reflux disease) 12/21/2017  . Declining functional status 08/18/2017  . Decreased functional mobility 08/18/2017  . Chronic kidney disease (CKD) 06/02/2017  . Arthritis 06/02/2017  . GI bleed 02/15/2017  . Acute blood loss anemia 02/15/2017  . Escherichia coli urinary tract infection 02/15/2017  . Hypoxia 02/15/2017  . Edema of upper extremity 02/15/2017  . Bilateral lower extremity edema 02/15/2017  . Pulmonary embolism (HCC) 02/08/2017  . Hypertension 02/08/2017  . Acute kidney injury superimposed on chronic kidney disease (HCC) 02/08/2017  . Polyneuropathy 02/08/2017  . Depression 02/08/2017    CMP     Component Value Date/Time   NA 141 03/01/2019 0000   K 4.4 03/01/2019 0000   CL 99 03/01/2019 0000   CO2 29 (A) 03/01/2019 0000   BUN 26 (A) 03/01/2019 0000   CREATININE 1.0 03/01/2019  0000   CALCIUM 9.7 03/01/2019 0000   ALBUMIN 4.1 10/06/2018 0000   ALBUMIN 4.1 10/06/2018 0000   AST 14 10/06/2018 0000   AST 14 10/06/2018 0000   ALT 19 10/06/2018 0000   ALT 19 10/06/2018 0000   ALKPHOS 86 10/06/2018 0000   ALKPHOS 86 10/06/2018 0000   GFRNONAA 56.16 03/01/2019 0000   GFRAA 65.09 03/01/2019 0000   Recent Labs    12/07/18 0000 12/31/18 0000 03/01/19 0000  NA 145 143 141  K 4.5 4.5 4.4  CL 102 102 99  CO2 27* 30* 29*  BUN 25* 28* 26*  CREATININE 1.0 0.9 1.0  CALCIUM 9.4 9.6 9.7   Recent Labs    05/26/18 0000 07/15/18 0000 10/06/18 0000  AST 18 19 14  14   ALT 23 15 19  19   ALKPHOS 101 80 86  86  ALBUMIN  --   --  4.1  4.1   Recent Labs    10/06/18 0000 12/07/18 0000 03/01/19 0000  WBC 6.6  6.6 5.9 6.4  NEUTROABS 4  4 3 4   HGB 11.0*  11.0* 11.2* 10.7*  HCT 36  36 36 35*  PLT 193  193 197 184   Recent Labs    05/05/18 0000 05/26/18 0000 10/06/18 0000  CHOL  144 156 155  155  LDLCALC 80 95 81  81  TRIG 172* 118 131  131   No results found for: MICROALBUR Lab Results  Component Value Date   TSH 2.23 10/06/2018   TSH 2.23 10/06/2018   Lab Results  Component Value Date   HGBA1C 9.7 01/30/2019   Lab Results  Component Value Date   CHOL 155 10/06/2018   CHOL 155 10/06/2018   HDL 48 10/06/2018   HDL 48 10/06/2018   LDLCALC 81 10/06/2018   LDLCALC 81 10/06/2018   TRIG 131 10/06/2018   TRIG 131 10/06/2018    Significant Diagnostic Results in last 30 days:  No results found.  Assessment and Plan  Hypertension Chronic and stable; continue Lasix 20 mg daily  GERD (gastroesophageal reflux disease) No reported problems; continue Protonix 40 mg daily  Diabetes mellitus type 2, uncontrolled (HCC) Most recent hemoglobin A1c is 9.7 which is very high; patient was recently seen by endocrinology which increased her sliding scale insulin, as there was no room to increase to the long-acting; will continue to monitor     10/08/2018, MD

## 2019-03-21 ENCOUNTER — Encounter: Payer: Self-pay | Admitting: Internal Medicine

## 2019-03-21 NOTE — Assessment & Plan Note (Signed)
Most recent hemoglobin A1c is 9.7 which is very high; patient was recently seen by endocrinology which increased her sliding scale insulin, as there was no room to increase to the long-acting; will continue to monitor

## 2019-03-21 NOTE — Assessment & Plan Note (Signed)
No reported problems; continue Protonix 40 mg daily 

## 2019-03-21 NOTE — Assessment & Plan Note (Signed)
Chronic and stable; continue Lasix 20 mg daily 

## 2019-04-08 ENCOUNTER — Non-Acute Institutional Stay (SKILLED_NURSING_FACILITY): Payer: Medicare Other | Admitting: Internal Medicine

## 2019-04-08 ENCOUNTER — Encounter: Payer: Self-pay | Admitting: Internal Medicine

## 2019-04-08 DIAGNOSIS — G629 Polyneuropathy, unspecified: Secondary | ICD-10-CM

## 2019-04-08 DIAGNOSIS — N183 Chronic kidney disease, stage 3 unspecified: Secondary | ICD-10-CM

## 2019-04-08 DIAGNOSIS — M1990 Unspecified osteoarthritis, unspecified site: Secondary | ICD-10-CM | POA: Diagnosis not present

## 2019-04-08 DIAGNOSIS — E1165 Type 2 diabetes mellitus with hyperglycemia: Secondary | ICD-10-CM | POA: Diagnosis not present

## 2019-04-08 DIAGNOSIS — I10 Essential (primary) hypertension: Secondary | ICD-10-CM

## 2019-04-08 NOTE — Progress Notes (Signed)
Location:    [facility] Room Number: 216/W Place of Service:  SNF [phone]) Provider:  Rae Lips, MD  Patient Care Team: [name], MD as PCP - General (Internal Medicine)  Extended Emergency Contact Information Primary Emergency Contact: Laney Potash Address: [address]          [place], [place] [address] Darden Amber of [place] Home Phone: [phone] Relation: Son Preferred language: English Interpreter needed? No Secondary Emergency Contact: Marcille Buffy Home Phone: [phone] Relation: Sister  Code Status:  DNR Goals of care: Advanced Directive information Advanced Directives 04/08/2019  Does Patient Have a Medical Advance Directive? Yes  Type of Advance Directive Out of facility DNR (pink MOST or yellow form)  Does patient want to make changes to medical advance directive? No - Patient declined  Would patient like information on creating a medical advance directive? -  Pre-existing out of facility DNR order (yellow form or pink MOST form) Yellow form placed in chart (order not valid for inpatient use)     Chief Complaint  Patient presents with  . 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  History of present illness.  Patient is a very pleasant 74 year old female with the above diagnosis she is does not really have any complaints today.  At one point she complained of significant neuropathic pain she is on Neurontin 600 mg 3 times daily-Cymbalta was added to at 60 mg a day-this appears to be improving somewhat.  She does have a history of type 2 diabetes which has been challenging she is seen by endocrinology who recently changed her medication she is  now on Lantus 35 units a day as well as an aggressive sliding scale starting at 101 CBG Blood sugars continue to be elevated often in the 200 range occasionally 300-will order a reconsult for follow-up by endocrinology.  Recent hemoglobin A1c was 9.7 has shown some mild improvement but still is fairly significantly elevated.  In regards to hypertension she is on Lopressor 12.5 mg twice daily Lasix 20 mg a day and lisinopril 2.5 mg a day this appears to be stable recent blood pressures 130/89-140/72-132/61.  At one point she complained of constipation at times but is not complaining of that today she is on MiraLAX.         Past Medical History:  Diagnosis Date  . Arthritis   . Depression   . Hypertension   . Pulmonary embolism [facility])    Past Surgical History:  Procedure Laterality Date  . ABDOMINAL HYSTERECTOMY    . BACK SURGERY    . REPLACEMENT TOTAL KNEE BILATERAL      No Known Allergies  Allergies as of 04/08/2019   No Known Allergies     Medication List       Accurate as of April 08, 2019  1:10 PM. If you have any questions, ask your nurse or doctor.        STOP taking these medications   insulin glargine 100 UNIT/ML injection Commonly known as: LANTUS Stopped by: Edmon Crape, PA-C     TAKE these medications   acetaminophen 325 MG tablet Commonly known as: TYLENOL Take 650 mg by mouth every 6 (six) hours as needed for mild pain or fever.   acetaminophen 500 MG tablet Commonly known  as: TYLENOL Take 1,000 mg by mouth 2 (two) times daily.   albuterol 108 (90 Base) MCG/ACT inhaler Commonly known as: VENTOLIN HFA Inhale 2 puffs into the lungs 3 (three) times daily.   aspirin EC 81 MG tablet Take 81 mg by mouth daily.   Claritin 10 MG tablet Generic drug: loratadine Take 10 mg by mouth daily.   DULoxetine 60 MG capsule Commonly known as: CYMBALTA Take 60 mg by mouth daily.   ferrous sulfate 325 (65 FE) MG tablet Take 325 mg by mouth daily with  breakfast.   Flonase Allergy Relief 50 MCG/ACT nasal spray Generic drug: fluticasone 2 Sprays by Nasal route nightly to relieve seasonal allergy   furosemide 20 MG tablet Commonly known as: LASIX Take 20 mg by mouth daily.   gabapentin 600 MG tablet Commonly known as: NEURONTIN Take 600 mg by mouth 3 (three) times daily.   Glucerna Liqd Take 237 mLs by mouth 2 (two) times daily between meals.   HumaLOG KwikPen 100 UNIT/ML KwikPen Generic drug: insulin lispro Inject into the skin 3 (three) times daily. SSI: 101-150 = 6 units, 151-200 = 8 units, 201-250 = 10 units, 251-300 = 14 units, 301-350 = 18 units, 351-400 = 22 units, greater than 400 = 26 units What changed: Another medication with the same name was removed. Continue taking this medication, and follow the directions you see here. Changed by: Edmon Crape, PA-C   lisinopril 2.5 MG tablet Commonly known as: ZESTRIL Take 2.5 mg by mouth daily.   metoprolol tartrate 25 MG tablet Commonly known as: LOPRESSOR Take 12.5 mg by mouth 2 (two) times daily. 0.5 tablet to = 12.5 mg   pantoprazole 40 MG tablet Commonly known as: PROTONIX Take 40 mg by mouth daily.   polyethylene glycol 17 g packet Commonly known as: MIRALAX / GLYCOLAX Take 17 g by mouth daily.   potassium chloride 10 MEQ tablet Commonly known as: KLOR-CON Take 10 mEq by mouth daily.   PREVIDENT 5000 BOOSTER DT Place 1 application onto teeth daily. Brush on teeth with toothbrush after PM mouth care, spit out excess, do not rinse   promethazine 25 MG tablet Commonly known as: PHENERGAN Take 25 mg by mouth every 6 (six) hours as needed for nausea or vomiting.       Review of Systems   In general she is not complaining of any fever or chills.  Skin no complaints of rashes or itching.  Head ears eyes nose mouth and throat does not complain of visual changes or sore throat.  Respiratory she does wear oxygen chronically-she is not complaining of any  shortness of breath or increased cough.  Cardiac does not complain of chest pain or lower extremity edema appears to be controlled.  GI does not complain of abdominal discomfort nausea vomiting diarrhea or constipation currently.  GU no complaints of dysuria.  Musculoskeletal continues with significant lower extremity weakness but pain appears to be controlled.  Neurologic is positive for neuropathy which at this point appears relatively well controlled she does not complain of headache dizziness or syncope.  Psych she is not complaining of being depressed or being anxious  Immunization History  Administered Date(s) Administered  . Influenza-Unspecified 09/07/2016, 09/25/2017, 10/10/2018  . Moderna SARS-COVID-2 Vaccination 01/07/2019, 02/04/2019  . Pneumococcal Polysaccharide-23 11/25/2017   Pertinent  Health Maintenance Due  Topic Date Due  . PNA vac Low Risk Adult (2 of 2 - PCV13) 11/26/2018  . FOOT EXAM  01/06/2019  . DEXA SCAN  10/08/2019 (Originally 07/06/2001)  . OPHTHALMOLOGY EXAM  11/24/2019 (Originally April 09, 1945)  . HEMOGLOBIN A1C  07/30/2019  . INFLUENZA VACCINE  08/08/2019   Fall Risk  11/21/2017  Falls in the past year? 0  Number falls in past yr: 0  Injury with Fall? 0   Functional Status Survey:    Vitals:   04/08/19 1302  BP: 130/89  Pulse: 77  Resp: 19  Temp: 98.2 F (36.8 C)  TempSrc: Oral  SpO2: 97%  Weight: 226 lb 14.4 oz (102.9 kg)  Height: 5' (1.524 m)   Body mass index is 44.31 kg/m. Physical Exam Is on board believe that Axel Filler) there are is 81 years of general this is a pleasant elderly female in no distress lying comfortably in bed.  Her skin is warm and dry.  Eyes visual acuity appears to be intact sclera and conjunctive are clear.  Oropharynx is clear mucous membranes moist.  Chest is clear to auscultation with somewhat shallow air entry there is no labored breathing.  Heart is regular rate and rhythm without murmur  gallop or rub she has trace lower extremity edema which appears to be somewhat improved compared to previous exams.  Abdomen is obese soft nontender with positive bowel sounds.  Musculoskeletal does move upper extremities at baseline she does have significant lower extremity weakness which appears unchanged.  Neurologic appears grossly intact her speech is clear touch sensation appears to be intact lower extremities.  Could not appreciate lateralizing findings.  Psych she is alert and oriented pleasant and appropriate Labs reviewed: Recent Labs    12/07/18 0000 12/31/18 0000 03/01/19 0000  NA 145 143 141  K 4.5 4.5 4.4  CL 102 102 99  CO2 27* 30* 29*  BUN 25* 28* 26*  CREATININE 1.0 0.9 1.0  CALCIUM 9.4 9.6 9.7   Recent Labs    05/26/18 0000 07/15/18 0000 10/06/18 0000  AST 18 19 14  14   ALT 23 15 19  19   ALKPHOS 101 80 86  86  ALBUMIN  --   --  4.1  4.1   Recent Labs    10/06/18 0000 12/07/18 0000 03/01/19 0000  WBC 6.6  6.6 5.9 6.4  NEUTROABS 4  4 3 4   HGB 11.0*  11.0* 11.2* 10.7*  HCT 36  36 36 35*  PLT 193  193 197 184   Lab Results  Component Value Date   TSH 2.23 10/06/2018   TSH 2.23 10/06/2018   Lab Results  Component Value Date   HGBA1C 9.7 01/30/2019   Lab Results  Component Value Date   CHOL 155 10/06/2018   CHOL 155 10/06/2018   HDL 48 10/06/2018   HDL 48 10/06/2018   LDLCALC 81 10/06/2018   LDLCALC 81 10/06/2018   TRIG 131 10/06/2018   TRIG 131 10/06/2018    Significant Diagnostic Results in last 30 days:  No results found.  Assessment/Plan #1 history of type 2 diabetes this is a challenging-her Lantus has been increased up to 35 units she is also on an aggressive sliding scale insulin starting at a CBG of 101 with 6 units and going up to 26 units for greater than 400.  She has been seen by endocrinology will order endocrinology follow-up.  Last hemoglobin A1c was 9.7 in January.  2.  History of hypertension appears  well controlled as noted above on Lopressor 12.5 mg twice daily Lasix 20 mg a day and lisinopril 2.5 mg a day.  3.  History  of chronic kidney disease this remained stable with a creatinine of 1.00 on lab in late February will monitor periodically but this is been stable.  4.  History of lower extremity edema continues on Lasix 20 mg a day with low-dose potassium-this appears stable per exam today.  5.-History of neuropathy diabetic related she is on Cymbalta 60 mg a day Neurontin 600 mg 3 times daily at this point appears to be controlled.  6.-History of anemia hemoglobin was 10.7 on lab done in late February this is fairly stable with previous value she is on iron 325 mg a day will monitor periodically.  7.  History constipation she is on MiraLAX she is not complaining of that today at this point will monitor.  8.  History of pulmonary embolism she is on aspirin 81 mg a day she does not complain of any chest pain or shortness of breath.  9.  History of GERD she is on Protonix 40 mg a day does not complain of symptoms today.  10.  History of allergic rhinitis continues on Claritin 10 mg a day she also has orders for Flonase nightly.  11.  History of osteoarthritis continues on Tylenol 1000 mg twice daily this appears to be helping   [phone]

## 2019-04-30 LAB — HEMOGLOBIN A1C: Hemoglobin A1C: 10.1

## 2019-05-10 ENCOUNTER — Encounter: Payer: Self-pay | Admitting: Internal Medicine

## 2019-05-10 ENCOUNTER — Non-Acute Institutional Stay (SKILLED_NURSING_FACILITY): Payer: Medicare Other | Admitting: Internal Medicine

## 2019-05-10 DIAGNOSIS — G629 Polyneuropathy, unspecified: Secondary | ICD-10-CM

## 2019-05-10 DIAGNOSIS — M1990 Unspecified osteoarthritis, unspecified site: Secondary | ICD-10-CM | POA: Diagnosis not present

## 2019-05-10 NOTE — Progress Notes (Signed)
Location:  [facility] Room Number: 216-W Place of Service:  SNF ([id number])  [name], MD  Patient Care Team: [name], MD as PCP - General (Internal Medicine)  Extended Emergency Contact Information Primary Emergency Contact: Laney Potash Address: [address]          [place], [place] [address] D[place] of [place] Home Phone: [phone] Relation: Son Preferred language: English Interpreter needed? No Secondary Emergency Contact: Marcille Buffy Home Phone: [phone] Relation: Sister    Allergies: Patient has no known allergies.  Chief Complaint  Patient presents with  . Medical Management of Chronic Issues    Routine [facility] visit    HPI: Patient is an 74 y.o. female who is being seen for routine issues of polyneuropathy, osteoarthritis, and CKD stage II.  Past Medical History:  Diagnosis Date  . Arthritis   . Depression   . Hypertension   . Pulmonary embolism [facility])     Past Surgical History:  Procedure Laterality Date  . ABDOMINAL HYSTERECTOMY    . BACK SURGERY    . REPLACEMENT TOTAL KNEE BILATERAL      Allergies as of 05/10/2019   No Known Allergies     Medication List       Accurate as of May 10, 2019 11:59 PM. If you have any questions, ask your nurse or doctor.        acetaminophen 325 MG tablet Commonly known as: TYLENOL Take 650 mg by mouth every 6 (six) hours as needed for mild pain or fever.   acetaminophen 500 MG tablet Commonly known as: TYLENOL Take 1,000 mg by mouth 2 (two) times daily.   albuterol 108 (90 Base) MCG/ACT inhaler Commonly known as: VENTOLIN HFA Inhale 2 puffs into the lungs 3 (three) times daily.   aspirin EC 81 MG tablet Take 81 mg by mouth daily.   Claritin 10 MG tablet Generic drug: loratadine Take 10 mg by mouth daily.   DULoxetine 60 MG capsule Commonly known as: CYMBALTA Take 60 mg by mouth daily.   ferrous sulfate 325 (65 FE) MG  tablet Take 325 mg by mouth daily with breakfast.   Flonase Allergy Relief 50 MCG/ACT nasal spray Generic drug: fluticasone 2 Sprays by Nasal route nightly to relieve seasonal allergy   furosemide 20 MG tablet Commonly known as: LASIX Take 20 mg by mouth daily.   gabapentin 600 MG tablet Commonly known as: NEURONTIN Take 600 mg by mouth 3 (three) times daily.   Glucerna Liqd Take 237 mLs by mouth 2 (two) times daily between meals.   HumaLOG KwikPen 100 UNIT/ML KwikPen Generic drug: insulin lispro Inject into the skin 3 (three) times daily. SSI: 101-150 = 6 units, 151-200 = 8 units, 201-250 = 10 units, 251-300 = 14 units, 301-350 = 18 units, 351-400 = 22 units, greater than 400 = 26 units   insulin glargine 100 UNIT/ML injection Commonly known as: LANTUS Inject 35 Units into the skin daily.   lisinopril 2.5 MG tablet Commonly known as: ZESTRIL Take 2.5 mg by mouth daily.   metoprolol tartrate 25 MG tablet Commonly known as: LOPRESSOR Take 12.5 mg by mouth 2 (two) times daily. 0.5 tablet to = 12.5 mg   pantoprazole 40 MG tablet Commonly known as: PROTONIX Take 40 mg by mouth daily.   polyethylene glycol 17 g packet Commonly known as: MIRALAX / GLYCOLAX Take 17 g by mouth daily.   potassium chloride 10 MEQ tablet  Commonly known as: KLOR-CON Take 10 mEq by mouth daily.   PREVIDENT 5000 BOOSTER DT Place 1 application onto teeth daily. Brush on teeth with toothbrush after PM mouth care, spit out excess, do not rinse   promethazine 25 MG tablet Commonly known as: PHENERGAN Take 25 mg by mouth every 6 (six) hours as needed for nausea or vomiting.       No orders of the defined types were placed in this encounter.   Immunization History  Administered Date(s) Administered  . Influenza-Unspecified 09/07/2016, 09/25/2017, 10/10/2018  . Moderna SARS-COVID-2 Vaccination 01/07/2019, 02/04/2019  . Pneumococcal Polysaccharide-23 11/25/2017    Social History    Tobacco Use  . Smoking status: Former Smoker    Years: 55.00    Quit date: 04/2016    Years since quitting: 3.0  . Smokeless tobacco: Never Used  Substance Use Topics  . Alcohol use: No    Review of Systems  GENERAL:  no fevers, fatigue, appetite changes SKIN: No itching, rash HEENT: No complaint RESPIRATORY: No cough, wheezing, SOB CARDIAC: No chest pain, palpitations, lower extremity edema  GI: No abdominal pain, No N/V/D or constipation, No heartburn or reflux  GU: No dysuria, frequency or urgency, or incontinence  MUSCULOSKELETAL: No unrelieved bone/joint pain NEUROLOGIC: No headache, dizziness  PSYCHIATRIC: No overt anxiety or sadness  Vitals:   05/10/19 1619  BP: 115/74  Pulse: 84  Resp: 18  Temp: 98.3 F (36.8 C)  SpO2: 92%   Body mass index is 44.72 kg/m. Physical Exam  GENERAL APPEARANCE: Alert, conversant, No acute distress  SKIN: No diaphoresis rash HEENT: Unremarkable RESPIRATORY: Breathing is even, unlabored. Lung sounds are clear   CARDIOVASCULAR: Heart RRR no murmurs, rubs or gallops. No peripheral edema  GASTROINTESTINAL: Abdomen is soft, non-tender, not distended w/ normal bowel sounds.  GENITOURINARY: Bladder non tender, not distended  MUSCULOSKELETAL: No abnormal joints or musculature NEUROLOGIC: Cranial nerves 2-12 grossly intact. Moves all extremities PSYCHIATRIC: Mood and affect appropriate to situation, no behavioral issues  Patient Active Problem List   Diagnosis Date Noted  . Diabetes mellitus type 2, uncontrolled ([place]) 12/19/2018  . COVID-19 virus infection 07/29/2018  . Hyperosmolar hyperglycemic coma due to diabetes mellitus without ketoacidosis ([place]) 02/21/2018  . New onset type 2 diabetes mellitus ([place]) 02/21/2018  . Acute renal failure superimposed on stage 3 chronic kidney disease ([place]) 02/21/2018  . Hypernatremia 02/21/2018  . Hypokalemia 02/21/2018  . Decubitus ulcer of coccygeal region, stage 2 ([place]) 02/21/2018  . GERD  (gastroesophageal reflux disease) 12/21/2017  . Declining functional status 08/18/2017  . Decreased functional mobility 08/18/2017  . CKD (chronic kidney disease) stage 2, GFR 60-89 ml/min 06/02/2017  . Arthritis 06/02/2017  . GI bleed 02/15/2017  . Acute blood loss anemia 02/15/2017  . Escherichia coli urinary tract infection 02/15/2017  . Hypoxia 02/15/2017  . Edema of upper extremity 02/15/2017  . Bilateral lower extremity edema 02/15/2017  . Pulmonary embolism ([place]) 02/08/2017  . Hypertension 02/08/2017  . Acute kidney injury superimposed on chronic kidney disease ([place]) 02/08/2017  . Polyneuropathy 02/08/2017  . Depression 02/08/2017    CMP     Component Value Date/Time   NA 141 03/01/2019 0000   K 4.4 03/01/2019 0000   CL 99 03/01/2019 0000   CO2 29 (A) 03/01/2019 0000   BUN 26 (A) 03/01/2019 0000   CREATININE 1.0 03/01/2019 0000   CALCIUM 9.7 03/01/2019 0000   ALBUMIN 4.1 10/06/2018 0000   ALBUMIN 4.1 10/06/2018 0000   AST 14 10/06/2018  0000   AST 14 10/06/2018 0000   ALT 19 10/06/2018 0000   ALT 19 10/06/2018 0000   ALKPHOS 86 10/06/2018 0000   ALKPHOS 86 10/06/2018 0000   GFRNONAA 56.16 03/01/2019 0000   GFRAA 65.09 03/01/2019 0000   Recent Labs    12/07/18 0000 12/31/18 0000 03/01/19 0000  NA 145 143 141  K 4.5 4.5 4.4  CL 102 102 99  CO2 27* 30* 29*  BUN 25* 28* 26*  CREATININE 1.0 0.9 1.0  CALCIUM 9.4 9.6 9.7   Recent Labs    05/26/18 0000 07/15/18 0000 10/06/18 0000  AST 18 19 14  14   ALT 23 15 19  19   ALKPHOS 101 80 86  86  ALBUMIN  --   --  4.1  4.1   Recent Labs    10/06/18 0000 12/07/18 0000 03/01/19 0000  WBC 6.6  6.6 5.9 6.4  NEUTROABS 4  4 3 4   HGB 11.0*  11.0* 11.2* 10.7*  HCT 36  36 36 35*  PLT 193  193 197 184   Recent Labs    05/26/18 0000 10/06/18 0000  CHOL 156 155  155  LDLCALC 95 81  81  TRIG 118 131  131   No results found for: MICROALBUR Lab Results  Component Value Date   TSH 2.23 10/06/2018    TSH 2.23 10/06/2018   Lab Results  Component Value Date   HGBA1C 10.1 04/30/2019   Lab Results  Component Value Date   CHOL 155 10/06/2018   CHOL 155 10/06/2018   HDL 48 10/06/2018   HDL 48 10/06/2018   LDLCALC 81 10/06/2018   LDLCALC 81 10/06/2018   TRIG 131 10/06/2018   TRIG 131 10/06/2018    Significant Diagnostic Results in last 30 days:  No results found.  Assessment and Plan  Polyneuropathy Appears controlled on new dose; continue Cymbalta, now 60 mg daily and Neurontin 600 mg 3 times daily which is the highest dose patient can take based on renal function  Arthritis Chronic and stable; continue Tylenol 500 mg twice daily  CKD (chronic kidney disease) stage 2, GFR 60-89 ml/min Last GFR was 63, placing patient in CKD stage 2, which is improved; continue to monitor intervals    10/08/2018, MD

## 2019-05-11 ENCOUNTER — Encounter: Payer: Self-pay | Admitting: Internal Medicine

## 2019-05-11 NOTE — Progress Notes (Signed)
Location:    [facility] Room Number: 216/W Place of Service:  SNF [phone]) Provider:  Rae Lips, MD  Patient Care Team: [name], MD as PCP - General (Internal Medicine)  Extended Emergency Contact Information Primary Emergency Contact: Laney Potash Address: [address]          [place], [place] [address] Darden Amber of [place] Home Phone: [phone] Relation: Son Preferred language: English Interpreter needed? No Secondary Emergency Contact: Marcille Buffy Home Phone: [phone] Relation: Sister  Code Status:  DNR Goals of care: Advanced Directive information Advanced Directives 05/11/2019  Does Patient Have a Medical Advance Directive? Yes  Type of Advance Directive Out of facility DNR (pink MOST or yellow form)  Does patient want to make changes to medical advance directive? No - Patient declined  Would patient like information on creating a medical advance directive? -  Pre-existing out of facility DNR order (yellow form or pink MOST form) Yellow form placed in chart (order not valid for inpatient use)     Chief Complaint  Patient presents with  . Medical Management of Chronic Issues    Routine visit of medical management  . Best Practice Recommendations    Hep-C Screening, Mammogram, Colonoscopy  . Immunizations    PCV-13    HPI:  Pt is a 74 y.o. female seen today for medical management of chronic diseases.     Past Medical History:  Diagnosis Date  . Arthritis   . Depression   . Hypertension   . Pulmonary embolism [facility])    Past Surgical History:  Procedure Laterality Date  . ABDOMINAL HYSTERECTOMY    . BACK SURGERY    . REPLACEMENT TOTAL KNEE BILATERAL      No Known Allergies  Allergies as of 05/11/2019   No Known Allergies     Medication List       Accurate as of May 11, 2019  4:19 PM. If you have any questions, ask your nurse or doctor.        acetaminophen 325 MG  tablet Commonly known as: TYLENOL Take 650 mg by mouth every 6 (six) hours as needed for mild pain or fever.   acetaminophen 500 MG tablet Commonly known as: TYLENOL Take 1,000 mg by mouth 2 (two) times daily.   albuterol 108 (90 Base) MCG/ACT inhaler Commonly known as: VENTOLIN HFA Inhale 2 puffs into the lungs 3 (three) times daily.   aspirin EC 81 MG tablet Take 81 mg by mouth daily.   Claritin 10 MG tablet Generic drug: loratadine Take 10 mg by mouth daily.   DULoxetine 60 MG capsule Commonly known as: CYMBALTA Take 60 mg by mouth daily.   ferrous sulfate 325 (65 FE) MG tablet Take 325 mg by mouth daily with breakfast.   Flonase Allergy Relief 50 MCG/ACT nasal spray Generic drug: fluticasone 2 Sprays by Nasal route nightly to relieve seasonal allergy   furosemide 20 MG tablet Commonly known as: LASIX Take 20 mg by mouth daily.   gabapentin 600 MG tablet Commonly known as: NEURONTIN Take 600 mg by mouth 3 (three) times daily.   Glucerna Liqd Take 237 mLs by mouth 2 (two) times daily between meals.   HumaLOG KwikPen 100 UNIT/ML KwikPen Generic drug: insulin lispro CBG with meals101-150=6 units ,151-200=8units,201-250=10units,251-300=14 units,301-350=18units 351-400=22units,greater than 400=26 units   insulin glargine 100 UNIT/ML injection Commonly known as: LANTUS Inject 35 Units into the skin at bedtime.   lisinopril  2.5 MG tablet Commonly known as: ZESTRIL Take 2.5 mg by mouth daily.   metoprolol tartrate 25 MG tablet Commonly known as: LOPRESSOR Take 12.5 mg by mouth 2 (two) times daily. 0.5 tablet to = 12.5 mg   NON FORMULARY DIET :Regular NAS CCD   pantoprazole 40 MG tablet Commonly known as: PROTONIX Take 40 mg by mouth daily.   polyethylene glycol 17 g packet Commonly known as: MIRALAX / GLYCOLAX Take 17 g by mouth daily.   potassium chloride 10 MEQ tablet Commonly known as: KLOR-CON Take 10 mEq by mouth daily.   PREVIDENT 5000 BOOSTER  DT Place 1 application onto teeth daily. Brush on teeth with toothbrush after PM mouth care, spit out excess, do not rinse   promethazine 25 MG tablet Commonly known as: PHENERGAN Take 25 mg by mouth every 6 (six) hours as needed for nausea or vomiting.       Review of Systems  Immunization History  Administered Date(s) Administered  . Influenza-Unspecified 09/07/2016, 09/25/2017, 10/10/2018  . Moderna SARS-COVID-2 Vaccination 01/07/2019, 02/04/2019  . Pneumococcal Polysaccharide-23 11/25/2017   Pertinent  Health Maintenance Due  Topic Date Due  . MAMMOGRAM  Never done  . COLONOSCOPY  Never done  . PNA vac Low Risk Adult (2 of 2 - PCV13) 11/26/2018  . DEXA SCAN  10/08/2019 (Originally 07/07/2011)  . OPHTHALMOLOGY EXAM  11/24/2019 (Originally 07/06/1955)  . INFLUENZA VACCINE  08/08/2019  . HEMOGLOBIN A1C  10/30/2019  . FOOT EXAM  03/15/2020   Fall Risk  11/21/2017  Falls in the past year? 0  Number falls in past yr: 0  Injury with Fall? 0   Functional Status Survey:    Vitals:   05/11/19 1604  BP: 138/78  Pulse: 74  Resp: 19  Temp: (!) 97.2 F (36.2 C)  TempSrc: Oral  SpO2: 98%  Weight: 229 lb 4.8 oz (104 kg)  Height: 5' (1.524 m)   Body mass index is 44.78 kg/m. Physical Exam  Labs reviewed: Recent Labs    12/07/18 0000 12/31/18 0000 03/01/19 0000  NA 145 143 141  K 4.5 4.5 4.4  CL 102 102 99  CO2 27* 30* 29*  BUN 25* 28* 26*  CREATININE 1.0 0.9 1.0  CALCIUM 9.4 9.6 9.7   Recent Labs    05/26/18 0000 07/15/18 0000 10/06/18 0000  AST 18 19 14  14   ALT 23 15 19  19   ALKPHOS 101 80 86  86  ALBUMIN  --   --  4.1  4.1   Recent Labs    10/06/18 0000 12/07/18 0000 03/01/19 0000  WBC 6.6  6.6 5.9 6.4  NEUTROABS 4  4 3 4   HGB 11.0*  11.0* 11.2* 10.7*  HCT 36  36 36 35*  PLT 193  193 197 184   Lab Results  Component Value Date   TSH 2.23 10/06/2018   TSH 2.23 10/06/2018   Lab Results  Component Value Date   HGBA1C 10.1  04/30/2019   Lab Results  Component Value Date   CHOL 155 10/06/2018   CHOL 155 10/06/2018   HDL 48 10/06/2018   HDL 48 10/06/2018   LDLCALC 81 10/06/2018   LDLCALC 81 10/06/2018   TRIG 131 10/06/2018   TRIG 131 10/06/2018    Significant Diagnostic Results in last 30 days:  No results found.  Assessment/Plan There are no diagnoses linked to this encounter.   Family/ staff Communication:   Labs/tests ordered:      This  encounter was created in error - please disregard.

## 2019-05-11 NOTE — Assessment & Plan Note (Signed)
Chronic and stable; continue Tylenol 500 mg twice daily

## 2019-05-11 NOTE — Assessment & Plan Note (Signed)
Appears controlled on new dose; continue Cymbalta, now 60 mg daily and Neurontin 600 mg 3 times daily which is the highest dose patient can take based on renal function

## 2019-05-11 NOTE — Assessment & Plan Note (Addendum)
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

## 2019-05-13 ENCOUNTER — Non-Acute Institutional Stay (SKILLED_NURSING_FACILITY): Payer: Medicare Other | Admitting: Internal Medicine

## 2019-05-13 ENCOUNTER — Encounter: Payer: Self-pay | Admitting: Internal Medicine

## 2019-05-13 DIAGNOSIS — E1165 Type 2 diabetes mellitus with hyperglycemia: Secondary | ICD-10-CM | POA: Diagnosis not present

## 2019-05-13 DIAGNOSIS — I10 Essential (primary) hypertension: Secondary | ICD-10-CM | POA: Diagnosis not present

## 2019-05-13 DIAGNOSIS — N182 Chronic kidney disease, stage 2 (mild): Secondary | ICD-10-CM | POA: Diagnosis not present

## 2019-05-13 NOTE — Progress Notes (Signed)
Location:    [facility] Room Number: 216/W Place of Service:  SNF [phone]) Provider:  Juanetta Beets, MD  Patient Care Team: [name], MD as PCP - General (Internal Medicine)  Extended Emergency Contact Information Primary Emergency Contact: Laney Potash Address: [address]          [place], [place] [address] Darden Amber of [place] Home Phone: [phone] Relation: Son Preferred language: English Interpreter needed? No Secondary Emergency Contact: Marcille Buffy Home Phone: [phone] Relation: Sister  Code Status:  DNR Goals of care: Advanced Directive information Advanced Directives 05/13/2019  Does Patient Have a Medical Advance Directive? Yes  Type of Advance Directive Out of facility DNR (pink MOST or yellow form)  Does patient want to make changes to medical advance directive? No - Patient declined  Would patient like information on creating a medical advance directive? -  Pre-existing out of facility DNR order (yellow form or pink MOST form) Yellow form placed in chart (order not valid for inpatient use)    Chief complaint-acute visit to follow-up elevated hemoglobin A1c-also anemia and chronic kidney disease   HPI:  Pt is a 74 y.o. female seen today for an acute visit for follow-up of diabetes with elevated hemoglobin A1c-also follow-up of anemia and chronic kidney disease   Patient is a long-term resident of the facility-she has a history of type 2 diabetes which is been difficult to control and she is seen and followed by endocrinology whose input is greatly appreciated.  Recent hemoglobin A1c was elevated at 10.1 which is relatively within her recent baseline which is been in the high nines.  She is on Lantus 35 units nightly as well as an aggressive sliding scale which starts at a blood sugar of 101 with 6 units and goes up to 26 units if CBG is greater than 400.  Review of CBGs show some variability  with morning CBGs appearing to be more in the higher 100s later in the day the mid to higher 200s with occasional 300-400 readings although these are not real frequent.  Clinically she has no complaints she is resting in bed comfortably vital signs appear to be stable.  She also has a history of anemia has been on iron 325 mg a day last globin was 10.7 back in February and will have this updated.  She also has a history of chronic kidney disease which is been stable now for some time last creatinine back in February was 1.0 with a BUN of 26 we will update this as well.       Past Medical History:  Diagnosis Date  . Arthritis   . Depression   . Hypertension   . Pulmonary embolism [facility])    Past Surgical History:  Procedure Laterality Date  . ABDOMINAL HYSTERECTOMY    . BACK SURGERY    . REPLACEMENT TOTAL KNEE BILATERAL      No Known Allergies  Outpatient Encounter Medications as of 05/13/2019  Medication Sig  . acetaminophen (TYLENOL) 325 MG tablet Take 650 mg by mouth every 6 (six) hours as needed for mild pain or fever.   Marland Kitchen acetaminophen (TYLENOL) 500 MG tablet Take 1,000 mg by mouth 2 (two) times daily.   Marland Kitchen albuterol (VENTOLIN HFA) 108 (90 Base) MCG/ACT inhaler Inhale 2 puffs into the lungs 3 (three) times daily.  Marland Kitchen aspirin EC 81 MG tablet Take 81 mg by mouth daily.  . DULoxetine (CYMBALTA) 60  MG capsule Take 60 mg by mouth daily.  . ferrous sulfate 325 (65 FE) MG tablet Take 325 mg by mouth daily with breakfast.  . fluticasone (FLONASE ALLERGY RELIEF) 50 MCG/ACT nasal spray 2 Sprays by Nasal route nightly to relieve seasonal allergy  . furosemide (LASIX) 20 MG tablet Take 20 mg by mouth daily.  Marland Kitchen gabapentin (NEURONTIN) 600 MG tablet Take 600 mg by mouth 3 (three) times daily.  . Glucerna (GLUCERNA) LIQD Take 237 mLs by mouth 2 (two) times daily between meals.   . insulin glargine (LANTUS) 100 UNIT/ML injection Inject 35 Units into the skin at bedtime.   . insulin lispro  (HUMALOG KWIKPEN) 100 UNIT/ML KwikPen CBG with meals101-150=6 units ,151-200=8units,201-250=10units,251-300=14 units,301-350=18units 351-400=22units,greater than 400=26 units  . lisinopril (ZESTRIL) 2.5 MG tablet Take 2.5 mg by mouth daily.  Marland Kitchen loratadine (CLARITIN) 10 MG tablet Take 10 mg by mouth daily.   . metoprolol tartrate (LOPRESSOR) 25 MG tablet Take 12.5 mg by mouth 2 (two) times daily. 0.5 tablet to = 12.5 mg  . NON FORMULARY DIET :Regular NAS CCD  . pantoprazole (PROTONIX) 40 MG tablet Take 40 mg by mouth daily.  . polyethylene glycol (MIRALAX / GLYCOLAX) packet Take 17 g by mouth daily.  . potassium chloride (K-DUR,KLOR-CON) 10 MEQ tablet Take 10 mEq by mouth daily.  . promethazine (PHENERGAN) 25 MG tablet Take 25 mg by mouth every 6 (six) hours as needed for nausea or vomiting.  . Sodium Fluoride (PREVIDENT 5000 BOOSTER DT) Place 1 application onto teeth daily. Brush on teeth with toothbrush after PM mouth care, spit out excess, do not rinse   No facility-administered encounter medications on file as of 05/13/2019.    Review of Systems   General she does not complain of any fever or chills.  Skin does not complain of rashes itching or diaphoresis.  Head ears eyes nose mouth and throat is not complain of visual changes or sore throat.  Respiratory does not complain of shortness of breath or cough she is on chronic oxygen.  Cardiac does not complain of chest pain her edema actually appears to be somewhat improved from previous exams.  GI is not complaining of abdominal pain nausea vomiting diarrhea constipation.  GU does not complain of dysuria.  Musculoskeletal has significant lower extremity weakness but does not complain of pain at this time.  Neurologic positive for weakness does not complain of dizziness headache syncope at this time.  Psych she is not complaining of being depressed or anxious continues to be in usual good spirits.    Immunization History    Administered Date(s) Administered  . Influenza-Unspecified 09/07/2016, 09/25/2017, 10/10/2018  . Moderna SARS-COVID-2 Vaccination 01/07/2019, 02/04/2019  . Pneumococcal Polysaccharide-23 11/25/2017   Pertinent  Health Maintenance Due  Topic Date Due  . MAMMOGRAM  Never done  . COLONOSCOPY  Never done  . PNA vac Low Risk Adult (2 of 2 - PCV13) 11/26/2018  . DEXA SCAN  10/08/2019 (Originally 07/07/2011)  . OPHTHALMOLOGY EXAM  11/24/2019 (Originally 07/06/1955)  . INFLUENZA VACCINE  08/08/2019  . HEMOGLOBIN A1C  10/30/2019  . FOOT EXAM  03/15/2020   Fall Risk  11/21/2017  Falls in the past year? 0  Number falls in past yr: 0  Injury with Fall? 0   Functional Status Survey:    Vitals:   05/13/19 0831  BP: 133/64  Pulse: 69  Resp: 20  Temp: 98 F (36.7 C)  TempSrc: Oral  SpO2: 97%  Weight: 229 lb 4.8  oz (104 kg)  Height: 5' (1.524 m)   Body mass index is 44.78 kg/m. Physical Exam General this is a pleasant elderly female in no distress lying comfortably in bed she does have oxygen applied.  Her skin is warm and dry.  Eyes visual acuity appears to be intact sclera and conjunctive are clear.  Oropharynx is clear mucous membranes moist.  Chest is clear to auscultation does have some shallow breath sounds #there is no labored breathing.  Heart is regular rate and rhythm without murmur gallop or rub he does have distant heart sounds.  She does have mild lower extremity edema which appears to be stable possibly slightly improved from previous exam.  Abdomen is obese soft nontender with positive bowel sounds.  Musculoskeletal is able to move all extremities x4 with continued lower extremity weakness.  Neurologic is grossly intact cannot really appreciate lateralizing findings her speech is clear.  Psych-continues to be pleasant and appropriate.     Labs reviewed: Recent Labs    12/07/18 0000 12/31/18 0000 03/01/19 0000  NA 145 143 141  K 4.5 4.5 4.4  CL 102  102 99  CO2 27* 30* 29*  BUN 25* 28* 26*  CREATININE 1.0 0.9 1.0  CALCIUM 9.4 9.6 9.7   Recent Labs    05/26/18 0000 07/15/18 0000 10/06/18 0000  AST 18 19 14  14   ALT 23 15 19  19   ALKPHOS 101 80 86  86  ALBUMIN  --   --  4.1  4.1   Recent Labs    10/06/18 0000 12/07/18 0000 03/01/19 0000  WBC 6.6  6.6 5.9 6.4  NEUTROABS 4  4 3 4   HGB 11.0*  11.0* 11.2* 10.7*  HCT 36  36 36 35*  PLT 193  193 197 184   Lab Results  Component Value Date   TSH 2.23 10/06/2018   TSH 2.23 10/06/2018   Lab Results  Component Value Date   HGBA1C 10.1 04/30/2019   Lab Results  Component Value Date   CHOL 155 10/06/2018   CHOL 155 10/06/2018   HDL 48 10/06/2018   HDL 48 10/06/2018   LDLCALC 81 10/06/2018   LDLCALC 81 10/06/2018   TRIG 131 10/06/2018   TRIG 131 10/06/2018    Significant Diagnostic Results in last 30 days:  No results found.  Assessment/Plan #1 diabetes type 2-this continues to be a challenge-there is some concern for hypoglycemia overnight and early morning hours-which has complicated matters-we have appreciated endocrinology input and will write an order to have endocrinology take a look at her recent hemoglobin A1c of 10.1 as well as recent CBGs-her Lantus has been gradually titrated up now 35 units nightly she is also on Humalog sliding scale which is quite aggressive as noted above with 6 units added blood sugar CBG 101-up to 26 units with a blood sugar of greater than 400  Clinically she appears to be stable and doing well does not report any episodes of significant hypoglycemia.  2.  History of anemia she is on iron 325 mg a day will have this updated last hemoglobin was 10.7 back in March.  3.  History of chronic kidney disease this is been stable for some time as well her most recent creatinine was 1.0 we will have this updated of date values.  4.  Hypertension this appears pretty stable recent systolics have been mainly in the 130s I do see one  systolic of 115-she is on lisinopril 2.5 mg a  day in addition to Lopressor 12.5 mg twice daily-she is also on Lasix 20 mg a day.  [id number]

## 2019-05-13 NOTE — Progress Notes (Deleted)
Location:    [facility] Room Number: 216/W Place of Service:  SNF [phone]) Provider:  Leda Min, MD  Patient Care Team: Hennie Duos, MD as PCP - General (Internal Medicine)  Extended Emergency Contact Information Primary Emergency Contact: Marshall Cork Address: [address]          [place], [place] [address] [place] Phone: [phone] Relation: Son Preferred language: English Interpreter needed? No Secondary Emergency Contact: Oren Binet Home Phone: [phone] Relation: Sister  Code Status:  DNR Goals of care: Advanced Directive information Advanced Directives 05/13/2019  Does Patient Have a Medical Advance Directive? Yes  Type of Advance Directive Out of facility DNR (pink MOST or yellow form)  Does patient want to make changes to medical advance directive? No - Patient declined  Would patient like information on creating a medical advance directive? -  Pre-existing out of facility DNR order (yellow form or pink MOST form) Yellow form placed in chart (order not valid for inpatient use)     Chief Complaint  Patient presents with  . Medical Management of Chronic Issues    Routine visit of medical management  . Best Practice Recommendations    Colonoscopy, Mammogram, Hep-C  . Immunizations    PCV-13    HPI:  Pt is a 74 y.o. female seen today for medical management of chronic diseases.     Past Medical History:  Diagnosis Date  . Arthritis   . Depression   . Hypertension   . Pulmonary embolism [facility])    Past Surgical History:  Procedure Laterality Date  . ABDOMINAL HYSTERECTOMY    . BACK SURGERY    . REPLACEMENT TOTAL KNEE BILATERAL      No Known Allergies  Allergies as of 05/13/2019   No Known Allergies     Medication List       Accurate as of May 13, 2019  8:34 AM. If you have any questions, ask your nurse or doctor.        acetaminophen 325 MG  tablet Commonly known as: TYLENOL Take 650 mg by mouth every 6 (six) hours as needed for mild pain or fever.   acetaminophen 500 MG tablet Commonly known as: TYLENOL Take 1,000 mg by mouth 2 (two) times daily.   albuterol 108 (90 Base) MCG/ACT inhaler Commonly known as: VENTOLIN HFA Inhale 2 puffs into the lungs 3 (three) times daily.   aspirin EC 81 MG tablet Take 81 mg by mouth daily.   Claritin 10 MG tablet Generic drug: loratadine Take 10 mg by mouth daily.   DULoxetine 60 MG capsule Commonly known as: CYMBALTA Take 60 mg by mouth daily.   ferrous sulfate 325 (65 FE) MG tablet Take 325 mg by mouth daily with breakfast.   Flonase Allergy Relief 50 MCG/ACT nasal spray Generic drug: fluticasone 2 Sprays by Nasal route nightly to relieve seasonal allergy   furosemide 20 MG tablet Commonly known as: LASIX Take 20 mg by mouth daily.   gabapentin 600 MG tablet Commonly known as: NEURONTIN Take 600 mg by mouth 3 (three) times daily.   Glucerna Liqd Take 237 mLs by mouth 2 (two) times daily between meals.   HumaLOG KwikPen 100 UNIT/ML KwikPen Generic drug: insulin lispro CBG with meals101-150=6 units ,151-200=8units,201-250=10units,251-300=14 units,301-350=18units 351-400=22units,greater than 400=26 units   insulin glargine 100 UNIT/ML injection Commonly known as: LANTUS Inject 35 Units into the skin at bedtime.   lisinopril 2.5  MG tablet Commonly known as: ZESTRIL Take 2.5 mg by mouth daily.   metoprolol tartrate 25 MG tablet Commonly known as: LOPRESSOR Take 12.5 mg by mouth 2 (two) times daily. 0.5 tablet to = 12.5 mg   NON FORMULARY DIET :Regular NAS CCD   pantoprazole 40 MG tablet Commonly known as: PROTONIX Take 40 mg by mouth daily.   polyethylene glycol 17 g packet Commonly known as: MIRALAX / GLYCOLAX Take 17 g by mouth daily.   potassium chloride 10 MEQ tablet Commonly known as: KLOR-CON Take 10 mEq by mouth daily.   PREVIDENT 5000 BOOSTER  DT Place 1 application onto teeth daily. Brush on teeth with toothbrush after PM mouth care, spit out excess, do not rinse   promethazine 25 MG tablet Commonly known as: PHENERGAN Take 25 mg by mouth every 6 (six) hours as needed for nausea or vomiting.       Review of Systems  Immunization History  Administered Date(s) Administered  . Influenza-Unspecified 09/07/2016, 09/25/2017, 10/10/2018  . Moderna SARS-COVID-2 Vaccination 01/07/2019, 02/04/2019  . Pneumococcal Polysaccharide-23 11/25/2017   Pertinent  Health Maintenance Due  Topic Date Due  . MAMMOGRAM  Never done  . COLONOSCOPY  Never done  . PNA vac Low Risk Adult (2 of 2 - PCV13) 11/26/2018  . DEXA SCAN  10/08/2019 (Originally 07/07/2011)  . OPHTHALMOLOGY EXAM  11/24/2019 (Originally 07/06/1955)  . INFLUENZA VACCINE  08/08/2019  . HEMOGLOBIN A1C  10/30/2019  . FOOT EXAM  03/15/2020   Fall Risk  11/21/2017  Falls in the past year? 0  Number falls in past yr: 0  Injury with Fall? 0   Functional Status Survey:    Vitals:   05/13/19 0831  BP: 133/64  Pulse: 69  Resp: 20  Temp: 98 F (36.7 C)  TempSrc: Oral  SpO2: 97%  Weight: 229 lb 4.8 oz (104 kg)  Height: 5' (1.524 m)   Body mass index is 44.78 kg/m. Physical Exam  Labs reviewed: Recent Labs    12/07/18 0000 12/31/18 0000 03/01/19 0000  NA 145 143 141  K 4.5 4.5 4.4  CL 102 102 99  CO2 27* 30* 29*  BUN 25* 28* 26*  CREATININE 1.0 0.9 1.0  CALCIUM 9.4 9.6 9.7   Recent Labs    05/26/18 0000 07/15/18 0000 10/06/18 0000  AST 18 19 14  14   ALT 23 15 19  19   ALKPHOS 101 80 86  86  ALBUMIN  --   --  4.1  4.1   Recent Labs    10/06/18 0000 12/07/18 0000 03/01/19 0000  WBC 6.6  6.6 5.9 6.4  NEUTROABS 4  4 3 4   HGB 11.0*  11.0* 11.2* 10.7*  HCT 36  36 36 35*  PLT 193  193 197 184   Lab Results  Component Value Date   TSH 2.23 10/06/2018   TSH 2.23 10/06/2018   Lab Results  Component Value Date   HGBA1C 10.1 04/30/2019    Lab Results  Component Value Date   CHOL 155 10/06/2018   CHOL 155 10/06/2018   HDL 48 10/06/2018   HDL 48 10/06/2018   LDLCALC 81 10/06/2018   LDLCALC 81 10/06/2018   TRIG 131 10/06/2018   TRIG 131 10/06/2018    Significant Diagnostic Results in last 30 days:  No results found.  Assessment/Plan There are no diagnoses linked to this encounter.   Family/ staff Communication:   Labs/tests ordered:

## 2019-05-15 ENCOUNTER — Encounter: Payer: Self-pay | Admitting: Internal Medicine

## 2019-05-20 LAB — BASIC METABOLIC PANEL
BUN: 23 — AB (ref 4–21)
CO2: 27 — AB (ref 13–22)
Chloride: 102 (ref 99–108)
Creatinine: 0.9 (ref 0.5–1.1)
Glucose: 153
Potassium: 4.9 (ref 3.4–5.3)
Sodium: 145 (ref 137–147)

## 2019-05-20 LAB — CBC AND DIFFERENTIAL
HCT: 36 (ref 36–46)
Hemoglobin: 11 — AB (ref 12.0–16.0)
Platelets: 195 (ref 150–399)
WBC: 6.4

## 2019-05-20 LAB — CBC: RBC: 4.28 (ref 3.87–5.11)

## 2019-05-20 LAB — COMPREHENSIVE METABOLIC PANEL
Calcium: 9.7 (ref 8.7–10.7)
GFR calc Af Amer: 71.89
GFR calc non Af Amer: 62.02

## 2019-06-10 ENCOUNTER — Encounter: Payer: Self-pay | Admitting: Internal Medicine

## 2019-06-10 DIAGNOSIS — Z66 Do not resuscitate: Secondary | ICD-10-CM

## 2019-06-10 NOTE — Progress Notes (Signed)
This encounter was created in error - please disregard.

## 2019-06-10 NOTE — Progress Notes (Deleted)
Location:  [facility] Room Number: 216-W Place of Service:  SNF [phone]) Provider:  Edmon Crape, P.A.  PCP: Kermit Balo, DO  Patient Care Team: Kermit Balo, DO as PCP - General (Geriatric Medicine)  Extended Emergency Contact Information Primary Emergency Contact: Laney Potash Address: [address]          [place], [place] [address] [place] of [place] Home Phone: [phone] Relation: Son Preferred language: English Interpreter needed? No Secondary Emergency Contact: Marcille Buffy Home Phone: [phone] Relation: Sister  Code Status:  DNR Goals of care: Advanced Directive information Advanced Directives 06/10/2019  Does Patient Have a Medical Advance Directive? Yes  Type of Advance Directive Out of facility DNR (pink MOST or yellow form)  Does patient want to make changes to medical advance directive? No - Patient declined  Would patient like information on creating a medical advance directive? -  Pre-existing out of facility DNR order (yellow form or pink MOST form) Yellow form placed in chart (order not valid for inpatient use)     Chief Complaint  Patient presents with  . Medical Management of Chronic Issues    Routine visit   . Best Practice Recommendations    Discuss need for Hep C screening   . Quality Metric Gaps    Discuss need for Mammogram and colonoscopy   . Immunizations    Discuss need for PNA   . Advanced Directive    Re-activate DNR order in Epic     HPI:  Pt is a 74 y.o. female seen today for medical management of chronic diseases.     Past Medical History:  Diagnosis Date  . Arthritis   . Depression   . Hypertension   . Pulmonary embolism [facility])    Past Surgical History:  Procedure Laterality Date  . ABDOMINAL HYSTERECTOMY    . BACK SURGERY    . REPLACEMENT TOTAL KNEE BILATERAL      No Known Allergies  Outpatient Encounter Medications as of 06/10/2019  Medication Sig  . acetaminophen  (TYLENOL) 325 MG tablet Take 650 mg by mouth every 6 (six) hours as needed for mild pain or fever.   Marland Kitchen acetaminophen (TYLENOL) 500 MG tablet Take 1,000 mg by mouth 2 (two) times daily.   Marland Kitchen albuterol (VENTOLIN HFA) 108 (90 Base) MCG/ACT inhaler Inhale 2 puffs into the lungs 3 (three) times daily.  Marland Kitchen aspirin EC 81 MG tablet Take 81 mg by mouth daily.  . DULoxetine (CYMBALTA) 60 MG capsule Take 60 mg by mouth daily.  . ferrous sulfate 325 (65 FE) MG tablet Take 325 mg by mouth daily with breakfast.  . fluticasone (FLONASE ALLERGY RELIEF) 50 MCG/ACT nasal spray 2 Sprays by Nasal route nightly to relieve seasonal allergy  . furosemide (LASIX) 20 MG tablet Take 20 mg by mouth daily.  Marland Kitchen gabapentin (NEURONTIN) 600 MG tablet Take 600 mg by mouth 3 (three) times daily.  . Glucerna (GLUCERNA) LIQD Take 237 mLs by mouth 2 (two) times daily between meals.   . insulin glargine (LANTUS) 100 UNIT/ML injection Inject 35 Units into the skin at bedtime.   . insulin lispro (HUMALOG KWIKPEN) 100 UNIT/ML KwikPen CBG with meals101-150=6 units ,151-200=8units,201-250=10units,251-300=14 units,301-350=18units 351-400=22units,greater than 400=26 units  . lisinopril (ZESTRIL) 2.5 MG tablet Take 2.5 mg by mouth daily.  Marland Kitchen loratadine (CLARITIN) 10 MG tablet Take 10 mg by mouth daily.   . metoprolol tartrate (LOPRESSOR) 25 MG tablet Take 12.5 mg by mouth 2 (  two) times daily. 0.5 tablet to = 12.5 mg  . NON FORMULARY DIET :Regular NAS CCD  . pantoprazole (PROTONIX) 40 MG tablet Take 40 mg by mouth daily.  . polyethylene glycol (MIRALAX / GLYCOLAX) packet Take 17 g by mouth daily.  . potassium chloride (K-DUR,KLOR-CON) 10 MEQ tablet Take 10 mEq by mouth daily.  . promethazine (PHENERGAN) 25 MG tablet Take 25 mg by mouth every 6 (six) hours as needed for nausea or vomiting.  . Sodium Fluoride (PREVIDENT 5000 BOOSTER DT) Place 1 application onto teeth daily. Brush on teeth with toothbrush after PM mouth care, spit out excess, do not  rinse   No facility-administered encounter medications on file as of 06/10/2019.    Review of Systems  Immunization History  Administered Date(s) Administered  . Influenza-Unspecified 09/07/2016, 09/25/2017, 10/10/2018  . Moderna SARS-COVID-2 Vaccination 01/07/2019, 02/04/2019  . Pneumococcal Polysaccharide-23 11/25/2017   Pertinent  Health Maintenance Due  Topic Date Due  . MAMMOGRAM  Never done  . COLONOSCOPY  Never done  . PNA vac Low Risk Adult (2 of 2 - PCV13) 11/26/2018  . DEXA SCAN  10/08/2019 (Originally 07/07/2011)  . OPHTHALMOLOGY EXAM  11/24/2019 (Originally 07/06/1955)  . INFLUENZA VACCINE  08/08/2019  . HEMOGLOBIN A1C  10/30/2019  . FOOT EXAM  03/15/2020   Fall Risk  11/21/2017  Falls in the past year? 0  Number falls in past yr: 0  Injury with Fall? 0   Functional Status Survey:    There were no vitals filed for this visit. There is no height or weight on file to calculate BMI. Physical Exam  Labs reviewed: Recent Labs    12/07/18 0000 12/31/18 0000 03/01/19 0000  NA 145 143 141  K 4.5 4.5 4.4  CL 102 102 99  CO2 27* 30* 29*  BUN 25* 28* 26*  CREATININE 1.0 0.9 1.0  CALCIUM 9.4 9.6 9.7   Recent Labs    07/15/18 0000 10/06/18 0000  AST 19 14  14   ALT 15 19  19   ALKPHOS 80 86  86  ALBUMIN  --  4.1  4.1   Recent Labs    10/06/18 0000 12/07/18 0000 03/01/19 0000  WBC 6.6  6.6 5.9 6.4  NEUTROABS 4  4 3 4   HGB 11.0*  11.0* 11.2* 10.7*  HCT 36  36 36 35*  PLT 193  193 197 184   Lab Results  Component Value Date   TSH 2.23 10/06/2018   TSH 2.23 10/06/2018   Lab Results  Component Value Date   HGBA1C 10.1 04/30/2019   Lab Results  Component Value Date   CHOL 155 10/06/2018   CHOL 155 10/06/2018   HDL 48 10/06/2018   HDL 48 10/06/2018   LDLCALC 81 10/06/2018   LDLCALC 81 10/06/2018   TRIG 131 10/06/2018   TRIG 131 10/06/2018    Significant Diagnostic Results in last 30 days:  No results found.  Assessment/Plan 1.  Do not resuscitate ***    Family/ staff Communication: ***  Labs/tests ordered:  ***

## 2019-06-17 ENCOUNTER — Non-Acute Institutional Stay (SKILLED_NURSING_FACILITY): Payer: Medicare Other | Admitting: Internal Medicine

## 2019-06-17 DIAGNOSIS — G629 Polyneuropathy, unspecified: Secondary | ICD-10-CM

## 2019-06-17 DIAGNOSIS — N182 Chronic kidney disease, stage 2 (mild): Secondary | ICD-10-CM

## 2019-06-17 DIAGNOSIS — E1165 Type 2 diabetes mellitus with hyperglycemia: Secondary | ICD-10-CM

## 2019-06-17 DIAGNOSIS — Z66 Do not resuscitate: Secondary | ICD-10-CM

## 2019-06-17 DIAGNOSIS — R600 Localized edema: Secondary | ICD-10-CM

## 2019-06-17 DIAGNOSIS — I10 Essential (primary) hypertension: Secondary | ICD-10-CM | POA: Diagnosis not present

## 2019-06-17 NOTE — Progress Notes (Signed)
Location:  [facility] Room Number: 216-W Place of Service:  SNF [phone]) Provider: Edmon Crape, P.A.  PCP: Kermit Balo, DO  Patient Care Team: Kermit Balo, DO as PCP - General (Geriatric Medicine)  Extended Emergency Contact Information Primary Emergency Contact: Laney Potash Address: [address]          [place], [place] [address] [place] of [place] Home Phone: [phone] Relation: Son Preferred language: English Interpreter needed? No Secondary Emergency Contact: Marcille Buffy Home Phone: [phone] Relation: Sister  Code Status:  DNR  Goals of care: Advanced Directive information Advanced Directives 06/10/2019  Does Patient Have a Medical Advance Directive? Yes  Type of Advance Directive Out of facility DNR (pink MOST or yellow form)  Does patient want to make changes to medical advance directive? No - Patient declined  Would patient like information on creating a medical advance directive? -  Pre-existing out of facility DNR order (yellow form or pink MOST form) Yellow form placed in chart (order not valid for inpatient use)     Chief complaint routine visit for medical management of chronic medical conditions including chronic kidney disease-hypertension-type 2 diabetes with neuropathy-osteoarthritis-lower extremity edema as well as anemia and a history of pulmonary embolism in the past.    HPI:  Pt is a 74 y.o. female seen today for medical management of chronic diseases as noted above.  Clinically she appears to be stable and does not really have any complaints today.  She does have a history of type 2 diabetes which continues to be a challenge and she is followed by endocrinology she is currently on Lantus 35 units a day along with an aggressive sliding scale starting at a blood sugar 101 with 6 units and up to 26 units for blood sugar of 351 up to 400.  Her blood sugars continue to be variable it appears in the  morning her blood sugars are mainly in the mid to higher 100s but later in the day there is variability ranging from the mid 100s to at times over 300 and occasionally over 400 although this is relatively rare.  Hemoglobin A1c in April was 10.1 update is pending for next month.  Having concerns with a.m. hypoglycemia in the past which has made things somewhat more challenging.    Her other medical issues continue to be stable she is on Neurontin 600 mg 3 times daily and Cymbalta 60 mg a day for neuropathy-at times she complains of pain but she is on the maximum dose of the Neurontin.  She does have Tylenol as well which apparently is helping some.  Regards to anemia this appears stable with a hemoglobin 11.0 on March labs she is on iron.   .  In regards to hypertension she is on Lopressor 12.5 mg twice daily and lisinopril 2.5 mg a day this appears stable recent systolics range from 107 up to 140.  She did recently have a tooth extracted on her right upper mouth-- she is not complaining of any pain appears to have tolerated this well     Past Medical History:  Diagnosis Date  . Arthritis   . Depression   . Hypertension   . Pulmonary embolism [facility])    Past Surgical History:  Procedure Laterality Date  . ABDOMINAL HYSTERECTOMY    . BACK SURGERY    . REPLACEMENT TOTAL KNEE BILATERAL      No Known Allergies  Outpatient Encounter Medications as of 06/17/2019  Medication Sig  . acetaminophen (TYLENOL) 325 MG tablet Take 650 mg by mouth every 6 (six) hours as needed for mild pain or fever.   Marland Kitchen acetaminophen (TYLENOL) 500 MG tablet Take 1,000 mg by mouth 2 (two) times daily.   Marland Kitchen albuterol (VENTOLIN HFA) 108 (90 Base) MCG/ACT inhaler Inhale 2 puffs into the lungs 3 (three) times daily.  Marland Kitchen aspirin EC 81 MG tablet Take 81 mg by mouth daily.  . DULoxetine (CYMBALTA) 60 MG capsule Take 60 mg by mouth daily.  . ferrous sulfate 325 (65 FE) MG tablet Take 325 mg by mouth daily with  breakfast.  . fluticasone (FLONASE ALLERGY RELIEF) 50 MCG/ACT nasal spray 2 Sprays by Nasal route nightly to relieve seasonal allergy  . furosemide (LASIX) 20 MG tablet Take 20 mg by mouth daily.  Marland Kitchen gabapentin (NEURONTIN) 600 MG tablet Take 600 mg by mouth 3 (three) times daily.  . Glucerna (GLUCERNA) LIQD Take 237 mLs by mouth 2 (two) times daily between meals.   . insulin glargine (LANTUS) 100 UNIT/ML injection Inject 35 Units into the skin at bedtime.   . insulin lispro (HUMALOG KWIKPEN) 100 UNIT/ML KwikPen CBG with meals101-150=6 units ,151-200=8units,201-250=10units,251-300=14 units,301-350=18units 351-400=22units,greater than 400=26 units  . lisinopril (ZESTRIL) 2.5 MG tablet Take 2.5 mg by mouth daily.  Marland Kitchen loratadine (CLARITIN) 10 MG tablet Take 10 mg by mouth daily.   . metoprolol tartrate (LOPRESSOR) 25 MG tablet Take 12.5 mg by mouth 2 (two) times daily. 0.5 tablet to = 12.5 mg  . NON FORMULARY DIET :Regular NAS CCD  . pantoprazole (PROTONIX) 40 MG tablet Take 40 mg by mouth daily.  . polyethylene glycol (MIRALAX / GLYCOLAX) packet Take 17 g by mouth daily.  . potassium chloride (K-DUR,KLOR-CON) 10 MEQ tablet Take 10 mEq by mouth daily.  . promethazine (PHENERGAN) 25 MG tablet Take 25 mg by mouth every 6 (six) hours as needed for nausea or vomiting.  . Sodium Fluoride (PREVIDENT 5000 BOOSTER DT) Place 1 application onto teeth daily. Brush on teeth with toothbrush after PM mouth care, spit out excess, do not rinse   No facility-administered encounter medications on file as of 06/17/2019.    Review of Systems   In general she is not complaining of any fever or chills.  Skin does not complain of rashes itching or diaphoresis.  Head ears eyes nose mouth and throat is not complaining of visual changes or sore throat.  Respiratory does not complain of being short of breath or having a cough-she does have a history of COPD on chronic oxygen.  Cardiac does not complain of chest pain her  edema appears to be well controlled.  GI is not complaining of abdominal discomfort nausea vomiting diarrhea constipation says she is having regular bowel movements.  GU does not complain of dysuria.  Musculoskeletal does have lower extremity weakness at baseline is not complaining of joint pain does complain of some weakness as well as what appears to be neuropathic discomfort.  Neurologic again does complain of neuropathic symptoms at times does not complain of headache or dizziness or syncopal feelings.  Psych does not complain of being depressed or anxious.    Immunization History  Administered Date(s) Administered  . Influenza-Unspecified 09/07/2016, 09/25/2017, 10/10/2018  . Moderna SARS-COVID-2 Vaccination 01/07/2019, 02/04/2019  . Pneumococcal Polysaccharide-23 11/25/2017   Pertinent  Health Maintenance Due  Topic Date Due  . MAMMOGRAM  Never done  . COLONOSCOPY  Never done  . PNA vac Low Risk Adult (2 of 2 -  PCV13) 11/26/2018  . DEXA SCAN  10/08/2019 (Originally 07/07/2011)  . OPHTHALMOLOGY EXAM  11/24/2019 (Originally 07/06/1955)  . INFLUENZA VACCINE  08/08/2019  . HEMOGLOBIN A1C  10/30/2019  . FOOT EXAM  03/15/2020   Fall Risk  11/21/2017  Falls in the past year? 0  Number falls in past yr: 0  Injury with Fall? 0   Functional Status Survey:    Temperature is 97.2 pulse 74 respirations 19 blood pressure 136/86 oxygen saturation is 96% on chronic oxygen  Physical Exam In general this is a pleasant elderly female in no distress lying comfortably in bed she is wearing her oxygen.  Her skin is warm and dry.  Eyes visual acuity appears to be intact sclera and conjunctive are clear.  Oropharynx is clear mucous membranes moist.  Chest is clear to auscultation with somewhat shallow air entry there is no labored breathing.  Heart sounds are somewhat distant regular rate and rhythm cannot appreciate a murmur gallop or rub she has mild lower extremity edema    Abdomen is obese soft nontender with positive bowel sounds.  Musculoskeletal moves her upper extremities at baseline has lower extremity weakness at baseline I do not note any deformity.  Neurologic is grossly intact cannot appreciate lateralizing findings touch sensation is intact lower extremities.  Her speech is clear.  Psych she is alert and oriented pleasant and appropriate.   Labs reviewed:  May 20, 2019.  WBC 6.4 hemoglobin 11.0 platelets 195.  Sodium 145 potassium 4.9 BUN 23.3 creatinine 0.92.  April 30, 2019.  Hemoglobin A1c 10.1.  Vitamin D was 39.   Recent Labs    12/07/18 0000 12/31/18 0000 03/01/19 0000  NA 145 143 141  K 4.5 4.5 4.4  CL 102 102 99  CO2 27* 30* 29*  BUN 25* 28* 26*  CREATININE 1.0 0.9 1.0  CALCIUM 9.4 9.6 9.7   Recent Labs    07/15/18 0000 10/06/18 0000  AST 19 14  14   ALT 15 19  19   ALKPHOS 80 86  86  ALBUMIN  --  4.1  4.1   Recent Labs    10/06/18 0000 12/07/18 0000 03/01/19 0000  WBC 6.6  6.6 5.9 6.4  NEUTROABS 4  4 3 4   HGB 11.0*  11.0* 11.2* 10.7*  HCT 36  36 36 35*  PLT 193  193 197 184   Lab Results  Component Value Date   TSH 2.23 10/06/2018   TSH 2.23 10/06/2018   Lab Results  Component Value Date   HGBA1C 10.1 04/30/2019   Lab Results  Component Value Date   CHOL 155 10/06/2018   CHOL 155 10/06/2018   HDL 48 10/06/2018   HDL 48 10/06/2018   LDLCALC 81 10/06/2018   LDLCALC 81 10/06/2018   TRIG 131 10/06/2018   TRIG 131 10/06/2018    Significant Diagnostic Results in last 30 days:  No results found.  Assessment/Plan  #1-history of hypertension this appears stable she is on lisinopril 2.5 mg a day as well as Lopressor 12.5 mg twice daily and Lasix 20 mg a day-recent blood pressures 136/86-109/70-140/81-107/65.  2.  History of diabetes-this continues to be a challenge with blood sugars as noted above has quite a bit of variability-update hemoglobin A1c is pending for next month-she  will need periodic endocrinology follow-up-.  She continues on Lantus 35 units a day as well as an aggressive sliding scale starting at a blood sugar of 101-at times she has required 26 units-it appears  her blood sugars tend to come down with a sliding scale-this is complicated with her risk of hypoglycemia in the morning.  3.-History of neuropathy most likely diabetic she is on Cymbalta 60 mg a day Neurontin 600 mg 3 times daily-she is pretty much maxed out on how much Neurontin she can use because of her kidney issues-at this point will monitor she does have Tylenol as well.  Apparently medications are helping but occasionally has some breakthrough discomfort.  4.-History of anemia this appears stable with a hemoglobin 11.0 on lab done last month will monitor periodically.-She continues on iron  5.  History of chronic kidney disease this appears stable as well with a creatinine of 0.92-I do note her sodium was 145 on recent lab-to ensure stability will update a BMP next week.  6.  History of pulmonary embolism-she continues on aspirin 81 mg a day there has been no evidence of recurrence.  7.  History of lower extremity edema continues on Lasix 20 mg a day with potassium supplementation edema appears to be quite stable-definitely improved from when I saw her initially in the facility.  8.  History of constipation she does not complain of issues here she is on MiraLAX.  9.  History of GERD she continues on Protonix 40 mg a day and this is been stable now for some time.  10.  History of allergic rhinitis she does not complain of issues with this today she continues on Claritin 10 mg a day as well as Flonase at night  #11 history of COPD? she continues on chronic oxygen she also has albuterol inhaler 3 times daily-she does not complain of any increased shortness of breath or cough.  [id number]

## 2019-06-18 ENCOUNTER — Encounter: Payer: Self-pay | Admitting: Internal Medicine

## 2019-06-24 LAB — COMPREHENSIVE METABOLIC PANEL
Calcium: 9.7 (ref 8.7–10.7)
GFR calc Af Amer: 64.95
GFR calc non Af Amer: 56.04

## 2019-06-24 LAB — BASIC METABOLIC PANEL
BUN: 25 — AB (ref 4–21)
CO2: 28 — AB (ref 13–22)
Chloride: 103 (ref 99–108)
Creatinine: 1 (ref 0.5–1.1)
Glucose: 241
Potassium: 4.5 (ref 3.4–5.3)
Sodium: 142 (ref 137–147)

## 2019-07-28 ENCOUNTER — Encounter: Payer: Self-pay | Admitting: Family

## 2019-07-28 ENCOUNTER — Non-Acute Institutional Stay (SKILLED_NURSING_FACILITY): Payer: Medicare Other | Admitting: Family

## 2019-07-28 DIAGNOSIS — E1165 Type 2 diabetes mellitus with hyperglycemia: Secondary | ICD-10-CM | POA: Diagnosis not present

## 2019-07-28 DIAGNOSIS — N182 Chronic kidney disease, stage 2 (mild): Secondary | ICD-10-CM

## 2019-07-28 DIAGNOSIS — I10 Essential (primary) hypertension: Secondary | ICD-10-CM

## 2019-07-28 DIAGNOSIS — G629 Polyneuropathy, unspecified: Secondary | ICD-10-CM | POA: Diagnosis not present

## 2019-07-28 NOTE — Progress Notes (Signed)
Location:    [facility] Room Number: 216/W Place of Service:  SNF [id number]) Provider:  Richarda Bladeinah Marylou Wages NP  Kermit Baloeed, Tiffany L, DO  Patient Care Team: Kermit Baloeed, Tiffany L, DO as PCP - General (Geriatric Medicine)  Extended Emergency Contact Information Primary Emergency Contact: Laney PotashHarris, Clarence Address: [address]          [place], [place] [id number] [place] Home Phone: [phone] Relation: Son Preferred language: English Interpreter needed? No Secondary Emergency Contact: Marcille Buffyroberts, lillian Home Phone: [phone] Relation: Sister  Code Status:  DNR Goals of care: Advanced Directive information Advanced Directives 07/28/2019  Does Patient Have a Medical Advance Directive? Yes  Type of Advance Directive Out of facility DNR (pink MOST or yellow form)  Does patient want to make changes to medical advance directive? No - Patient declined  Would patient like information on creating a medical advance directive? -  Pre-existing out of facility DNR order (yellow form or pink MOST form) Yellow form placed in chart (order not valid for inpatient use)     Chief Complaint  Patient presents with   Medical Management of Chronic Issues    Routine visit of medical management   Best Practice Recommendations    Colonoscopy, Mammogram   Immunizations    Hep-C Screening, PCV-13    HPI:  Pt is a [age] y.o. female seen today for medical management of chronic diseases. She denies any acute issues.She is seen lying up in bed.She has a medical history of uncontrolled type 2 DM managed by Endocrinologist,Hypertension,Chronic Kidney disease stage 2,PolyNeuropathy,Osteoarthritis,Hx PE among other conditions. Her CBG log reviewed 180's -300's currently on lantus along with sliding scale.On chart review,heard to control due to tendency for hypoglycemia. B/p ranging in the 100's/80's - 130's/80's on metoprolol 12.5 mg tablet daily and lisinopril 2.5 mg tablet  daily.she denies any signs of hypotension.    Past Medical History:  Diagnosis Date   Arthritis    Depression    Hypertension    Pulmonary embolism (HCC)    Past Surgical History:  Procedure Laterality Date   ABDOMINAL HYSTERECTOMY     BACK SURGERY     REPLACEMENT TOTAL KNEE BILATERAL      No Known Allergies  Allergies as of 07/28/2019   No Known Allergies     Medication List       Accurate as of July 28, 2019 10:25 AM. If you have any questions, ask your nurse or doctor.        STOP taking these medications   Glucerna Liqd Stopped by: Caesar Bookmaninah C Jackelyne Sayer, NP     TAKE these medications   acetaminophen 325 MG tablet Commonly known as: TYLENOL Take 650 mg by mouth every 6 (six) hours as needed for mild pain or fever.   acetaminophen 500 MG tablet Commonly known as: TYLENOL Take 1,000 mg by mouth 2 (two) times daily.   albuterol 108 (90 Base) MCG/ACT inhaler Commonly known as: VENTOLIN HFA Inhale 2 puffs into the lungs 3 (three) times daily.   aspirin EC 81 MG tablet Take 81 mg by mouth daily.   Claritin 10 MG tablet Generic drug: loratadine Take 10 mg by mouth daily.   DULoxetine 60 MG capsule Commonly known as: CYMBALTA Take 60 mg by mouth daily.   ferrous sulfate 325 (65 FE) MG tablet Take 325 mg by mouth daily with breakfast.   Flonase Allergy Relief 50 MCG/ACT nasal spray Generic drug: fluticasone 2 Sprays by Nasal  route nightly to relieve seasonal allergy   furosemide 20 MG tablet Commonly known as: LASIX Take 20 mg by mouth daily.   gabapentin 600 MG tablet Commonly known as: NEURONTIN Take 600 mg by mouth 3 (three) times daily.   HumaLOG KwikPen 100 UNIT/ML KwikPen Generic drug: insulin lispro CBG with meals101-150=6 units ,151-200=8units,201-250=10units,251-300=14 units,301-350=18units 351-400=22units,greater than 400=26 units   insulin glargine 100 UNIT/ML injection Commonly known as: LANTUS Inject 35 Units into the skin at  bedtime.   lisinopril 2.5 MG tablet Commonly known as: ZESTRIL Take 2.5 mg by mouth daily.   metoprolol tartrate 25 MG tablet Commonly known as: LOPRESSOR Take 12.5 mg by mouth 2 (two) times daily. 0.5 tablet to = 12.5 mg   NON FORMULARY DIET :Regular NAS CCD   pantoprazole 40 MG tablet Commonly known as: PROTONIX Take 40 mg by mouth daily.   polyethylene glycol 17 g packet Commonly known as: MIRALAX / GLYCOLAX Take 17 g by mouth daily.   potassium chloride 10 MEQ tablet Commonly known as: KLOR-CON Take 10 mEq by mouth daily.   PREVIDENT 5000 BOOSTER DT Place 1 application onto teeth daily. Brush on teeth with toothbrush after PM mouth care, spit out excess, do not rinse   promethazine 25 MG tablet Commonly known as: PHENERGAN Take 25 mg by mouth every 6 (six) hours as needed for nausea or vomiting.       Review of Systems  Constitutional: Negative for activity change, chills, fatigue and fever.  HENT: Negative for congestion, rhinorrhea, sinus pressure, sinus pain, sneezing and sore throat.   Eyes: Negative for discharge, redness and itching.  Respiratory: Negative for cough, chest tightness, shortness of breath and wheezing.   Cardiovascular: Negative for chest pain, palpitations and leg swelling.  Gastrointestinal: Negative for abdominal distention, abdominal pain, constipation, diarrhea, nausea and vomiting.  Endocrine: Negative for cold intolerance, heat intolerance, polydipsia, polyphagia and polyuria.  Genitourinary: Negative for difficulty urinating, dysuria, flank pain, frequency and urgency.  Musculoskeletal: Positive for arthralgias and gait problem. Negative for joint swelling, myalgias and neck pain.  Skin: Negative for color change, pallor, rash and wound.  Neurological: Negative for dizziness, speech difficulty, weakness, light-headedness and headaches.  Hematological: Does not bruise/bleed easily.  Psychiatric/Behavioral: Negative for agitation,  confusion and sleep disturbance. The patient is not nervous/anxious.     Immunization History  Administered Date(s) Administered   Influenza, High Dose Seasonal PF 09/11/2016   Influenza-Unspecified 09/07/2016, 09/25/2017, 10/10/2018   Moderna SARS-COVID-2 Vaccination 01/07/2019, 02/04/2019   Pneumococcal Polysaccharide-23 11/25/2017   Pertinent  Health Maintenance Due  Topic Date Due   MAMMOGRAM  Never done   COLONOSCOPY  Never done   PNA vac Low Risk Adult (2 of 2 - PCV13) 11/26/2018   DEXA SCAN  10/08/2019 (Originally 07/07/2011)   OPHTHALMOLOGY EXAM  11/24/2019 (Originally 07/06/1955)   INFLUENZA VACCINE  08/08/2019   HEMOGLOBIN A1C  10/30/2019   FOOT EXAM  03/15/2020   Fall Risk  11/21/2017  Falls in the past year? 0  Number falls in past yr: 0  Injury with Fall? 0    Vitals:   07/28/19 1003  BP: 130/78  Pulse: 75  Resp: 19  Temp: (!) 97.2 F (36.2 C)  TempSrc: Oral  SpO2: 96%  Weight: 226 lb 9.6 oz (102.8 kg)  Height: 5' (1.524 m)   Body mass index is 44.25 kg/m. Physical Exam Vitals reviewed.  Constitutional:      General: She is not in acute distress.    Appearance:  She is morbidly obese. She is not ill-appearing.  HENT:     Head: Normocephalic.     Nose: Nose normal. No congestion or rhinorrhea.     Mouth/Throat:     Mouth: Mucous membranes are moist.     Pharynx: Oropharynx is clear. No oropharyngeal exudate or posterior oropharyngeal erythema.  Eyes:     General: No scleral icterus.       Right eye: No discharge.        Left eye: No discharge.     Conjunctiva/sclera: Conjunctivae normal.     Pupils: Pupils are equal, round, and reactive to light.  Neck:     Vascular: No carotid bruit.  Cardiovascular:     Rate and Rhythm: Normal rate and regular rhythm.     Pulses: Normal pulses.     Heart sounds: Normal heart sounds. No murmur heard.  No friction rub. No gallop.   Pulmonary:     Effort: Pulmonary effort is normal. No respiratory  distress.     Breath sounds: Normal breath sounds. No wheezing, rhonchi or rales.  Chest:     Chest wall: No tenderness.  Abdominal:     General: Bowel sounds are normal. There is no distension.     Palpations: Abdomen is soft. There is no mass.     Tenderness: There is no abdominal tenderness. There is no right CVA tenderness, left CVA tenderness, guarding or rebound.  Musculoskeletal:        General: No swelling or tenderness.     Cervical back: Normal range of motion. No rigidity or tenderness.     Right lower leg: No edema.     Comments: Unsteady gait   Lymphadenopathy:     Cervical: No cervical adenopathy.  Skin:    General: Skin is warm.     Coloration: Skin is not pale.     Findings: No bruising, erythema or rash.  Neurological:     Mental Status: She is alert and oriented to person, place, and time.     Cranial Nerves: No cranial nerve deficit.     Motor: No weakness.     Coordination: Coordination normal.     Gait: Gait abnormal.  Psychiatric:        Mood and Affect: Mood normal.        Behavior: Behavior normal.        Thought Content: Thought content normal.        Judgment: Judgment normal.    Labs reviewed: Recent Labs    03/01/19 0000 05/20/19 0000 06/24/19 0000  NA 141 145 142  K 4.4 4.9 4.5  CL 99 102 103  CO2 29* 27* 28*  BUN 26* 23* 25*  CREATININE 1.0 0.9 1.0  CALCIUM 9.7 9.7 9.7   Recent Labs    10/06/18 0000  AST 14   14  ALT 19   19  ALKPHOS 86   86  ALBUMIN 4.1   4.1   Recent Labs    10/06/18 0000 10/06/18 0000 12/07/18 0000 03/01/19 0000 05/20/19 0000  WBC 6.6   6.6   < > 5.9 6.4 6.4  NEUTROABS 4   4  --  3 4  --   HGB 11.0*   11.0*   < > 11.2* 10.7* 11.0*  HCT 36   36   < > 36 35* 36  PLT 193   193   < > 197 184 195   < > = values in this interval not  displayed.   Lab Results  Component Value Date   TSH 2.23 10/06/2018   TSH 2.23 10/06/2018   Lab Results  Component Value Date   HGBA1C 10.1 04/30/2019   Lab Results    Component Value Date   CHOL 155 10/06/2018   CHOL 155 10/06/2018   HDL 48 10/06/2018   HDL 48 10/06/2018   LDLCALC 81 10/06/2018   LDLCALC 81 10/06/2018   TRIG 131 10/06/2018   TRIG 131 10/06/2018    Significant Diagnostic Results in last 30 days:  No results found.  Assessment/Plan 1. Uncontrolled type 2 diabetes mellitus with hyperglycemia (HCC)   Lab Results  Component Value Date   HGBA1C 10.1 04/30/2019  - uncontrolled CBG varies from 180's -300's  - continue on Lantus 35 units and Humalog per sliding scale managed by Endocrinologist  - continue on Gabapentin 600 mg tablet three times daily. - continue on ACE inhibitor for renal protection.on ASA tablet daily.  2. Essential hypertension B/p at goal.continue on Lisinopril 2.5 mg tablet daily and metoprolol tartrate 12.5 mg tablet twice daily. - continue on EC 81 mg tablet daily.    3. Polyneuropathy Continue on Gabapentin 600 mg tablet three times daily.   4. CKD (chronic kidney disease) stage 2, GFR 60-89 ml/min CR at baseline. Continue to avoid nephrotoxins and dose for renal clearance.   Family/ staff Communication: Reviewed plan of care with patient and facility Nurse  Labs/tests ordered: None

## 2019-07-30 LAB — HEMOGLOBIN A1C: Hemoglobin A1C: 9.8

## 2019-09-10 ENCOUNTER — Non-Acute Institutional Stay (SKILLED_NURSING_FACILITY): Payer: Medicare Other | Admitting: Internal Medicine

## 2019-09-10 DIAGNOSIS — E1165 Type 2 diabetes mellitus with hyperglycemia: Secondary | ICD-10-CM | POA: Diagnosis not present

## 2019-09-10 DIAGNOSIS — I10 Essential (primary) hypertension: Secondary | ICD-10-CM

## 2019-09-10 DIAGNOSIS — K219 Gastro-esophageal reflux disease without esophagitis: Secondary | ICD-10-CM

## 2019-09-10 DIAGNOSIS — G629 Polyneuropathy, unspecified: Secondary | ICD-10-CM | POA: Diagnosis not present

## 2019-09-10 DIAGNOSIS — N182 Chronic kidney disease, stage 2 (mild): Secondary | ICD-10-CM | POA: Diagnosis not present

## 2019-09-10 DIAGNOSIS — R2689 Other abnormalities of gait and mobility: Secondary | ICD-10-CM

## 2019-09-10 DIAGNOSIS — Z66 Do not resuscitate: Secondary | ICD-10-CM

## 2019-09-10 DIAGNOSIS — J9611 Chronic respiratory failure with hypoxia: Secondary | ICD-10-CM

## 2019-09-10 NOTE — Progress Notes (Signed)
Location:  [facility] Room Number: [id number] Place of Service:   SNF Provider:  Laiklynn Raczynski L. Renato Gails, D.O., C.M.D.  Kermit Balo, DO  Patient Care Team: Kermit Balo, DO as PCP - General (Geriatric Medicine)  Extended Emergency Contact Information Primary Emergency Contact: Laney Potash Address: [address]          [place], [place] [address] [place] Home Phone: [phone] Relation: Son Preferred language: English Interpreter needed? No Secondary Emergency Contact: Marcille Buffy Home Phone: [phone] Relation: Sister  Code Status:  DNR Goals of care: Advanced Directive information Advanced Directives 09/15/2019  Does Patient Have a Medical Advance Directive? Yes  Type of Advance Directive Out of facility DNR (pink MOST or yellow form)  Does patient want to make changes to medical advance directive? No - Patient declined  Would patient like information on creating a medical advance directive? -  Pre-existing out of facility DNR order (yellow form or pink MOST form) Yellow form placed in chart (order not valid for inpatient use)     Chief Complaint  Patient presents with  . Medical Management of Chronic Issues    Routine    HPI:  Pt is a 74 y.o. female seen today for medical management of chronic diseases.  Kristine Todd is followed by [facility] as a long-term care resident at [facility].  It appears she was first admitted way back in Jan of 2019 after an acute PE and acute renal failure.  She then had a GI bleed and UTI after that with another hospital stay.  In Feb of last year she had HONKA.  She has been here receiving assistance with ADLs.  She has a h/o uncontrolled diabetes with neuropathy (followed by endocrine outside of here), CKD2, arthritis.  She wears oxygen presumably since her PE.  She is on lantus 35 units and humalog meal time SSI.    C/o right hip pain and pain in legs that comes and goes Requesting something to  rub on there.  She does have tylenol scheduled bid and also prn.  On cymbalta.    Past Medical History:  Diagnosis Date  . Arthritis   . Depression   . Hypertension   . Pulmonary embolism [facility])    Past Surgical History:  Procedure Laterality Date  . ABDOMINAL HYSTERECTOMY    . BACK SURGERY    . REPLACEMENT TOTAL KNEE BILATERAL      No Known Allergies  Outpatient Encounter Medications as of 09/10/2019  Medication Sig  . acetaminophen (TYLENOL) 325 MG tablet Take 650 mg by mouth every 6 (six) hours as needed for mild pain or fever.   Marland Kitchen acetaminophen (TYLENOL) 500 MG tablet Take 1,000 mg by mouth 2 (two) times daily.   Marland Kitchen albuterol (VENTOLIN HFA) 108 (90 Base) MCG/ACT inhaler Inhale 2 puffs into the lungs 3 (three) times daily.  Marland Kitchen aspirin EC 81 MG tablet Take 81 mg by mouth daily.  . DULoxetine (CYMBALTA) 60 MG capsule Take 60 mg by mouth daily.  . ferrous sulfate 325 (65 FE) MG tablet Take 325 mg by mouth daily with breakfast.  . fluticasone (FLONASE ALLERGY RELIEF) 50 MCG/ACT nasal spray 2 Sprays by Nasal route nightly to relieve seasonal allergy  . furosemide (LASIX) 20 MG tablet Take 20 mg by mouth daily.  Marland Kitchen gabapentin (NEURONTIN) 600 MG tablet Take 600 mg by mouth 3 (three) times daily.  . insulin glargine (LANTUS) 100 UNIT/ML injection Inject 35  Units into the skin at bedtime.   . insulin lispro (HUMALOG KWIKPEN) 100 UNIT/ML KwikPen CBG with meals101-150=6 units ,151-200=8units,201-250=10units,251-300=14 units,301-350=18units 351-400=22units,greater than 400=26 units  . lisinopril (ZESTRIL) 2.5 MG tablet Take 2.5 mg by mouth daily.  Marland Kitchen. loratadine (CLARITIN) 10 MG tablet Take 10 mg by mouth daily.   . metoprolol tartrate (LOPRESSOR) 25 MG tablet Take 12.5 mg by mouth 2 (two) times daily. 0.5 tablet to = 12.5 mg  . pantoprazole (PROTONIX) 40 MG tablet Take 40 mg by mouth daily.  . polyethylene glycol (MIRALAX / GLYCOLAX) packet Take 17 g by mouth daily.  . potassium chloride  (K-DUR,KLOR-CON) 10 MEQ tablet Take 10 mEq by mouth daily.  . promethazine (PHENERGAN) 25 MG tablet Take 25 mg by mouth every 6 (six) hours as needed for nausea or vomiting.  . Sodium Fluoride (PREVIDENT 5000 BOOSTER DT) Place 1 application onto teeth daily. Brush on teeth with toothbrush after PM mouth care, spit out excess, do not rinse  . [DISCONTINUED] NON FORMULARY DIET :Regular NAS CCD   No facility-administered encounter medications on file as of 09/10/2019.    Review of Systems  Constitutional: Negative for chills, fever and malaise/fatigue.  HENT: Negative for congestion.   Eyes: Negative for blurred vision.  Respiratory: Positive for shortness of breath. Negative for cough.        Wears O2 2l via [place]  Cardiovascular: Negative for chest pain, palpitations and leg swelling.  Gastrointestinal: Negative for abdominal pain, blood in stool, constipation, diarrhea and melena.  Genitourinary: Negative for dysuria.  Musculoskeletal: Positive for joint pain. Negative for falls.  Neurological: Positive for tingling and sensory change. Negative for loss of consciousness.  Endo/Heme/Allergies: Does not bruise/bleed easily.  Psychiatric/Behavioral: Negative for depression. The patient is not nervous/anxious and does not have insomnia.     Immunization History  Administered Date(s) Administered  . Influenza, High Dose Seasonal PF 09/11/2016  . Influenza-Unspecified 09/07/2016, 09/25/2017, 10/10/2018  . Moderna SARS-COVID-2 Vaccination 01/07/2019, 02/04/2019  . Pneumococcal Polysaccharide-23 11/25/2017   Pertinent  Health Maintenance Due  Topic Date Due  . MAMMOGRAM  Never done  . COLONOSCOPY  Never done  . PNA vac Low Risk Adult (2 of 2 - PCV13) 11/26/2018  . INFLUENZA VACCINE  08/08/2019  . DEXA SCAN  10/08/2019 (Originally 07/07/2011)  . OPHTHALMOLOGY EXAM  11/24/2019 (Originally 07/06/1955)  . HEMOGLOBIN A1C  01/30/2020  . FOOT EXAM  03/15/2020   Fall Risk  11/21/2017  Falls in the  past year? 0  Number falls in past yr: 0  Injury with Fall? 0   Functional Status Survey:    Vitals:   09/14/19 1111  BP: 140/87  Pulse: 82  Temp: (!) 96.5 F (35.8 C)  SpO2: 95%  Weight: 226 lb (102.5 kg)  Height: 5' (1.524 m)   Body mass index is 44.14 kg/m. Physical Exam Vitals reviewed.  Constitutional:      General: She is not in acute distress.    Appearance: Normal appearance. She is obese.  HENT:     Head: Normocephalic and atraumatic.  Eyes:     Pupils: Pupils are equal, round, and reactive to light.  Cardiovascular:     Rate and Rhythm: Normal rate and regular rhythm.     Pulses: Normal pulses.     Heart sounds: Normal heart sounds.  Pulmonary:     Effort: Pulmonary effort is normal.     Breath sounds: Normal breath sounds. No wheezing, rhonchi or rales.     Comments:  Wearing 2L O2 via [place] Abdominal:     General: Bowel sounds are normal.     Palpations: Abdomen is soft.  Musculoskeletal:     Right lower leg: No edema.     Left lower leg: No edema.     Comments: Mobility limited in bed; feet appear dropped bilaterally; deconditioned; uses manual wheelchair when out of bed but admits to staying in bed a lot b/c it's more comfortable for her  Skin:    General: Skin is warm and dry.  Neurological:     Mental Status: She is alert and oriented to person, place, and time. Mental status is at baseline.     Motor: Weakness present.     Gait: Gait abnormal.     Comments: Speech dysarthric (may be due to dentition)  Psychiatric:        Mood and Affect: Mood normal.        Behavior: Behavior normal.     Labs reviewed: Recent Labs    03/01/19 0000 05/20/19 0000 06/24/19 0000  NA 141 145 142  K 4.4 4.9 4.5  CL 99 102 103  CO2 29* 27* 28*  BUN 26* 23* 25*  CREATININE 1.0 0.9 1.0  CALCIUM 9.7 9.7 9.7   Recent Labs    10/06/18 0000  AST 14  14  ALT 19  19  ALKPHOS 86  86  ALBUMIN 4.1  4.1   Recent Labs    10/06/18 0000 10/06/18 0000  12/07/18 0000 03/01/19 0000 05/20/19 0000  WBC 6.6  6.6   < > 5.9 6.4 6.4  NEUTROABS 4  4  --  3 4  --   HGB 11.0*  11.0*   < > 11.2* 10.7* 11.0*  HCT 36  36   < > 36 35* 36  PLT 193  193   < > 197 184 195   < > = values in this interval not displayed.   Lab Results  Component Value Date   TSH 2.23 10/06/2018   TSH 2.23 10/06/2018   Lab Results  Component Value Date   HGBA1C 9.8 07/30/2019   Lab Results  Component Value Date   CHOL 155 10/06/2018   CHOL 155 10/06/2018   HDL 48 10/06/2018   HDL 48 10/06/2018   LDLCALC 81 10/06/2018   LDLCALC 81 10/06/2018   TRIG 131 10/06/2018   TRIG 131 10/06/2018    Assessment/Plan 1. Uncontrolled type 2 diabetes mellitus with hyperglycemia (HCC) -managed by outside endocrinologist so on sliding scale though not recommended in SNF setting -CBGs are mostly in the 200s-300s, rarely one drops to 130s or 150s--not all CBGs appear to be in the CBG results b/c for several days, only hs results are noted -hard to track or doing anything with this when that's the case  2. Polyneuropathy -cont gabapentin therapy as is, cannot increase with her renal function  3. Essential hypertension -bps have been at goal so cont same regimen  4. CKD (chronic kidney disease) stage 2, GFR 60-89 ml/min -Avoid nephrotoxic agents like nsaids, dose adjust renally excreted meds, hydrate. -if gets ill with dehydration and ARF, will need her lisinopril held  5. DNR (do not resuscitate) -code status updated in system  6. Decreased functional mobility -continue SNF level of care, cannot move body even in bed well  7. Gastroesophageal reflux disease without esophagitis -cont protonix  8.  Chronic hypoxic respiratory failure -s/p PE -on chronic O2  Family/ staff Communication: pt seen and d/w  nursing  Labs/tests ordered:  hba1c next draw   Kristine Todd L. Talina Pleitez, D.O. Geriatrics [facility] [address], [place] [address] Cell Phone (Mon-Fri 8am-5pm):  [phone] On Call:  [phone] & follow prompts after 5pm & weekends Office Phone:  [phone] Office Fax:  [phone]

## 2019-09-14 ENCOUNTER — Encounter: Payer: Self-pay | Admitting: Internal Medicine

## 2019-09-15 ENCOUNTER — Encounter: Payer: Self-pay | Admitting: Family

## 2019-09-15 ENCOUNTER — Non-Acute Institutional Stay (SKILLED_NURSING_FACILITY): Payer: Medicare Other | Admitting: Family

## 2019-09-15 DIAGNOSIS — E1165 Type 2 diabetes mellitus with hyperglycemia: Secondary | ICD-10-CM | POA: Diagnosis not present

## 2019-09-15 DIAGNOSIS — I10 Essential (primary) hypertension: Secondary | ICD-10-CM

## 2019-09-15 DIAGNOSIS — N183 Chronic kidney disease, stage 3 unspecified: Secondary | ICD-10-CM

## 2019-09-15 DIAGNOSIS — K219 Gastro-esophageal reflux disease without esophagitis: Secondary | ICD-10-CM | POA: Diagnosis not present

## 2019-09-15 LAB — HEMOGLOBIN A1C: Hemoglobin A1C: 9.5

## 2019-09-15 NOTE — Progress Notes (Addendum)
Location:    [facility].   Nursing Home Room Number: 216-W Place of Service:  SNF (31) Provider:  Richarda Blade, NP    Patient Care Team: Kermit Balo, DO as PCP - General (Geriatric Medicine)  Extended Emergency Contact Information Primary Emergency Contact: Laney Potash Address: [address].          [place], [place] [address] Darden Amber of [place] Home Phone: [phone] Relation: Son Preferred language: English Interpreter needed? No Secondary Emergency Contact: Marcille Buffy Home Phone: [phone] Relation: Sister  Code Status:  DNR Goals of care: Advanced Directive information Advanced Directives 09/15/2019  Does Patient Have a Medical Advance Directive? Yes  Type of Advance Directive Out of facility DNR (pink MOST or yellow form)  Does patient want to make changes to medical advance directive? No - Patient declined  Would patient like information on creating a medical advance directive? -  Pre-existing out of facility DNR order (yellow form or pink MOST form) Yellow form placed in chart (order not valid for inpatient use)     Chief Complaint  Patient presents with  . Medical Management of Chronic Issues    Routine Visit.   Marland Kitchen Health Maintenance    Discuss the need for Hepatitis C Screening, Mammogram, and Colonoscopy.  . Immunizations    Discuss the need for Tetanus Vaccine, Influenza Vaccine, and PNA Vaccine.     HPI:  Pt is a 74 y.o. female seen today for medical management of chronic diseases.she is seen awake in bed.she denies any acute issue during visit.she has a medical history of Hypertension,uncontrolled type 2 DM with Neuropathy managed by Endocrinologist,GERD,Osteoarthritis,CKD stage 2,Hx of PE  Among others. Her CBG log readings are in the 200's-300's.Hgb A1C recent ordered.she is on Lantus 35 units and Humalog KwiKpen  High intensity scale with meals.she deneis any signs of hypo/hypergylcemia.  Her weight has been  stable.she continue to require assistance with her bed mobility.she is incontinent.  He blood pressure log are stable.denies any headache,dizziness ,palpitation or chest pain.    Past Medical History:  Diagnosis Date  . Arthritis   . Depression   . Hypertension   . Pulmonary embolism [facility])    Past Surgical History:  Procedure Laterality Date  . ABDOMINAL HYSTERECTOMY    . BACK SURGERY    . REPLACEMENT TOTAL KNEE BILATERAL      No Known Allergies  Allergies as of 09/15/2019   No Known Allergies     Medication List       Accurate as of September 15, 2019 10:29 AM. If you have any questions, ask your nurse or doctor.        acetaminophen 325 MG tablet Commonly known as: TYLENOL Take 650 mg by mouth every 6 (six) hours as needed for mild pain or fever.   acetaminophen 500 MG tablet Commonly known as: TYLENOL Take 1,000 mg by mouth 2 (two) times daily.   albuterol 108 (90 Base) MCG/ACT inhaler Commonly known as: VENTOLIN HFA Inhale 2 puffs into the lungs 3 (three) times daily.   aspirin EC 81 MG tablet Take 81 mg by mouth daily.   Claritin 10 MG tablet Generic drug: loratadine Take 10 mg by mouth daily.   DULoxetine 60 MG capsule Commonly known as: CYMBALTA Take 60 mg by mouth daily.   ferrous sulfate 325 (65 FE) MG tablet Take 325 mg by mouth daily with breakfast.   Flonase Allergy Relief 50 MCG/ACT nasal spray Generic drug: fluticasone  2 Sprays by Nasal route nightly to relieve seasonal allergy   furosemide 20 MG tablet Commonly known as: LASIX Take 20 mg by mouth daily.   gabapentin 600 MG tablet Commonly known as: NEURONTIN Take 600 mg by mouth 3 (three) times daily.   HumaLOG KwikPen 100 UNIT/ML KwikPen Generic drug: insulin lispro CBG with meals101-150=6 units ,151-200=8units,201-250=10units,251-300=14 units,301-350=18units 351-400=22units,greater than 400=26 units   insulin glargine 100 UNIT/ML injection Commonly known as: LANTUS Inject 35  Units into the skin at bedtime.   lisinopril 2.5 MG tablet Commonly known as: ZESTRIL Take 2.5 mg by mouth daily.   metoprolol tartrate 25 MG tablet Commonly known as: LOPRESSOR Take 12.5 mg by mouth 2 (two) times daily. 0.5 tablet to = 12.5 mg   pantoprazole 40 MG tablet Commonly known as: PROTONIX Take 40 mg by mouth daily.   polyethylene glycol 17 g packet Commonly known as: MIRALAX / GLYCOLAX Take 17 g by mouth daily.   potassium chloride 10 MEQ tablet Commonly known as: KLOR-CON Take 10 mEq by mouth daily.   PREVIDENT 5000 BOOSTER DT Place 1 application onto teeth daily. Brush on teeth with toothbrush after PM mouth care, spit out excess, do not rinse   promethazine 25 MG tablet Commonly known as: PHENERGAN Take 25 mg by mouth every 6 (six) hours as needed for nausea or vomiting.   Voltaren 1 % Gel Generic drug: diclofenac Sodium Apply 4 g topically as needed.       Review of Systems  Constitutional: Negative for appetite change, chills, fatigue and fever.  HENT: Negative for congestion, rhinorrhea, sinus pressure, sinus pain, sneezing and sore throat.        Several missing teeth   Eyes: Negative for pain, discharge, redness and itching.  Respiratory: Negative for cough, chest tightness, shortness of breath and wheezing.   Cardiovascular: Negative for chest pain, palpitations and leg swelling.  Gastrointestinal: Negative for abdominal distention, abdominal pain, constipation, diarrhea, nausea and vomiting.  Endocrine: Negative for cold intolerance, heat intolerance, polydipsia, polyphagia and polyuria.  Genitourinary: Negative for difficulty urinating and urgency.       Incontinent   Musculoskeletal: Positive for arthralgias and gait problem. Negative for joint swelling, myalgias and neck pain.  Skin: Negative for color change, pallor and rash.  Neurological: Positive for weakness and numbness. Negative for dizziness, seizures, speech difficulty,  light-headedness and headaches.  Hematological: Negative for adenopathy. Does not bruise/bleed easily.  Psychiatric/Behavioral: Negative for agitation and sleep disturbance. The patient is not nervous/anxious.     Immunization History  Administered Date(s) Administered  . Influenza, High Dose Seasonal PF 09/11/2016  . Influenza-Unspecified 09/07/2016, 09/25/2017, 10/10/2018  . Moderna SARS-COVID-2 Vaccination 01/07/2019, 02/04/2019  . Pneumococcal Polysaccharide-23 11/25/2017   Pertinent  Health Maintenance Due  Topic Date Due  . MAMMOGRAM  Never done  . COLONOSCOPY  Never done  . PNA vac Low Risk Adult (2 of 2 - PCV13) 11/26/2018  . INFLUENZA VACCINE  08/08/2019  . DEXA SCAN  10/08/2019 (Originally 07/07/2011)  . OPHTHALMOLOGY EXAM  11/24/2019 (Originally 07/06/1955)  . HEMOGLOBIN A1C  01/30/2020  . FOOT EXAM  03/15/2020   Fall Risk  11/21/2017  Falls in the past year? 0  Number falls in past yr: 0  Injury with Fall? 0    Vitals:   09/15/19 0958  BP: 120/83  Pulse: 84  Resp: 18  Temp: 98 F (36.7 C)  SpO2: 95%  Weight: 228 lb 9.6 oz (103.7 kg)  Height: 5' (1.524 m)  Body mass index is 44.65 kg/m. Physical Exam Vitals and nursing note reviewed.  Constitutional:      General: She is not in acute distress.    Appearance: She is morbidly obese. She is not ill-appearing.  HENT:     Head: Normocephalic.     Nose: Nose normal. No congestion or rhinorrhea.     Mouth/Throat:     Mouth: Mucous membranes are moist.     Pharynx: Oropharynx is clear. No oropharyngeal exudate or posterior oropharyngeal erythema.  Eyes:     General: No scleral icterus.       Right eye: No discharge.        Left eye: No discharge.     Conjunctiva/sclera: Conjunctivae normal.     Pupils: Pupils are equal, round, and reactive to light.  Cardiovascular:     Rate and Rhythm: Normal rate and regular rhythm.     Pulses: Normal pulses.     Heart sounds: Normal heart sounds. No murmur heard.    No friction rub. No gallop.   Pulmonary:     Effort: Pulmonary effort is normal. No respiratory distress.     Breath sounds: Normal breath sounds. No wheezing, rhonchi or rales.  Chest:     Chest wall: No tenderness.  Abdominal:     General: Bowel sounds are normal. There is no distension.     Palpations: Abdomen is soft. There is no mass.     Tenderness: There is no abdominal tenderness. There is no right CVA tenderness, left CVA tenderness, guarding or rebound.  Musculoskeletal:        General: No swelling or tenderness.     Cervical back: Normal range of motion. No rigidity or tenderness.     Right lower leg: No edema.     Left lower leg: No edema.     Comments: Moves x 4 extremities with lower extremities weakness/foot drop  Lymphadenopathy:     Cervical: No cervical adenopathy.  Skin:    General: Skin is warm and dry.     Coloration: Skin is not pale.     Findings: No bruising, erythema or rash.  Neurological:     Mental Status: She is alert. Mental status is at baseline.     Gait: Gait abnormal.  Psychiatric:        Mood and Affect: Mood normal.        Speech: Speech normal.        Behavior: Behavior normal.        Thought Content: Thought content normal.        Judgment: Judgment normal.     Labs reviewed: Recent Labs    03/01/19 0000 05/20/19 0000 06/24/19 0000  NA 141 145 142  K 4.4 4.9 4.5  CL 99 102 103  CO2 29* 27* 28*  BUN 26* 23* 25*  CREATININE 1.0 0.9 1.0  CALCIUM 9.7 9.7 9.7   Recent Labs    10/06/18 0000  AST 14  14  ALT 19  19  ALKPHOS 86  86  ALBUMIN 4.1  4.1   Recent Labs    10/06/18 0000 10/06/18 0000 12/07/18 0000 03/01/19 0000 05/20/19 0000  WBC 6.6  6.6   < > 5.9 6.4 6.4  NEUTROABS 4  4  --  3 4  --   HGB 11.0*  11.0*   < > 11.2* 10.7* 11.0*  HCT 36  36   < > 36 35* 36  PLT 193  193   < >  197 184 195   < > = values in this interval not displayed.   Lab Results  Component Value Date   TSH 2.23 10/06/2018   TSH  2.23 10/06/2018   Lab Results  Component Value Date   HGBA1C 9.8 07/30/2019   Lab Results  Component Value Date   CHOL 155 10/06/2018   CHOL 155 10/06/2018   HDL 48 10/06/2018   HDL 48 10/06/2018   LDLCALC 81 10/06/2018   LDLCALC 81 10/06/2018   TRIG 131 10/06/2018   TRIG 131 10/06/2018    Significant Diagnostic Results in last 30 days:  No results found.  Assessment/Plan 1. Essential hypertension B/p at [place]. - continue on lisinopril ,metoprolol  2. Uncontrolled type 2 diabetes mellitus with hyperglycemia (HCC) CBG in the 200's -300's.Hgb A1C 9.8  - will increase Lantus from 36 units to 38 units until seen by Endocrinologist - continue on Lispro per sliding scale. - Continue on ACE inhibitor,ASA and BBB  - continue on Gabapentin for neuropathy - Hgb A1C drawn today awaiting results.    3. Gastroesophageal reflux disease without esophagitis Symptoms under control. - continue on Protonix 40 mg tablet daily   4. Stage 3 chronic kidney disease, unspecified whether stage 3a or 3b CKD CR at baseline. - continue to avoid nephrotoxins and dose for renal clearance.  Family/ staff Communication: Reviewed plan of care with patient and facility Nurse   Labs/tests ordered:  None

## 2019-10-20 ENCOUNTER — Encounter: Payer: Self-pay | Admitting: Family

## 2019-10-20 ENCOUNTER — Non-Acute Institutional Stay (SKILLED_NURSING_FACILITY): Payer: Medicare Other | Admitting: Family

## 2019-10-20 DIAGNOSIS — K219 Gastro-esophageal reflux disease without esophagitis: Secondary | ICD-10-CM

## 2019-10-20 DIAGNOSIS — N183 Chronic kidney disease, stage 3 unspecified: Secondary | ICD-10-CM | POA: Diagnosis not present

## 2019-10-20 DIAGNOSIS — J9611 Chronic respiratory failure with hypoxia: Secondary | ICD-10-CM

## 2019-10-20 DIAGNOSIS — E1140 Type 2 diabetes mellitus with diabetic neuropathy, unspecified: Secondary | ICD-10-CM | POA: Diagnosis not present

## 2019-10-20 DIAGNOSIS — G629 Polyneuropathy, unspecified: Secondary | ICD-10-CM

## 2019-10-20 DIAGNOSIS — I10 Essential (primary) hypertension: Secondary | ICD-10-CM

## 2019-10-20 DIAGNOSIS — Z23 Encounter for immunization: Secondary | ICD-10-CM

## 2019-10-20 DIAGNOSIS — Z794 Long term (current) use of insulin: Secondary | ICD-10-CM

## 2019-10-20 DIAGNOSIS — Z1159 Encounter for screening for other viral diseases: Secondary | ICD-10-CM

## 2019-10-20 NOTE — Progress Notes (Signed)
Location:    [facility].   Nursing Home Room Number: 216-W Place of Service:  SNF (31) Provider:  Richarda Blade, NP   Patient Care Team: Kermit Balo, DO as PCP - General (Geriatric Medicine)  Extended Emergency Contact Information Primary Emergency Contact: Laney Potash Address: [address]          [place], [place] [address] D[place] of [place] Home Phone: [phone] Relation: Son Preferred language: English Interpreter needed? No Secondary Emergency Contact: Marcille Buffy Home Phone: [phone] Relation: Sister  Code Status:  DNR Goals of care: Advanced Directive information Advanced Directives 10/20/2019  Does Patient Have a Medical Advance Directive? Yes  Type of Advance Directive Out of facility DNR (pink MOST or yellow form)  Does patient want to make changes to medical advance directive? No - Patient declined  Would patient like information on creating a medical advance directive? -  Pre-existing out of facility DNR order (yellow form or pink MOST form) -     Chief Complaint  Patient presents with  . Medical Management of Chronic Issues    Routine Visit.   Marland Kitchen Health Maintenance    Discuss the need for Hepatitis C Screening, Mammogram, Colonoscopy, and Dexa Scan.  . Immunizations    Discuss the need for PNA Vaccine, Influenza Vaccine, and Tetanus Vaccine.     HPI:  Pt is a 74 y.o. female seen today for medical management of chronic diseases.she has medical history Hypertension, Type 2 DM with peripheral Neuropathy,CKD stage 3,major Depression,Hx PE,chronic respiratory Failure with hypoxia on oxygen via nasal cannula among other conditions. She is seen up in bed awake.  she complains of left eye " grit feeling" for several days.she denies any itching,pain,drainage or vision impairment.she request referral to Ophthalmology for evaluation.she is also due for annual diabetic eye exam. She continues to follow up with Endocrinologist  seen last february ,2021 .Her sugars have improved since Lantus was increased on last month's visit to 38 units.CBG readings in the 130's-190's compared to the 200's-300's last month. She denies any symptoms of hypoglycemia.she continues to complain of neuropathic pain on her feet. Her blood pressure readings are well controlled. Has had a slow progressive weight gain over four months but has had no significant cough,edema,wheezing or shortness of breath   Weight 225.4 lbs ( 06/22/2019 )  Weight 226.6 lbs ( 07/22/2019 )  Weight 228.6 lbs ( 08/20/2019 )  Weight 231.4 lbs ( 09/20/2019 )  No fall episode of acute illness. She is due for a Tdap and Pneumococcal vaccine.Also need a Hep C screening.  Past Medical History:  Diagnosis Date  . Arthritis   . Depression   . Hypertension   . Pulmonary embolism [facility])    Past Surgical History:  Procedure Laterality Date  . ABDOMINAL HYSTERECTOMY    . BACK SURGERY    . REPLACEMENT TOTAL KNEE BILATERAL      No Known Allergies  Allergies as of 10/20/2019   No Known Allergies     Medication List       Accurate as of October 20, 2019 10:30 AM. If you have any questions, ask your nurse or doctor.        acetaminophen 325 MG tablet Commonly known as: TYLENOL Take 650 mg by mouth every 6 (six) hours as needed for mild pain or fever.   acetaminophen 500 MG tablet Commonly known as: TYLENOL Take 1,000 mg by mouth 2 (two) times daily.   albuterol 108 (90  Base) MCG/ACT inhaler Commonly known as: VENTOLIN HFA Inhale 2 puffs into the lungs 3 (three) times daily.   aspirin EC 81 MG tablet Take 81 mg by mouth daily.   Claritin 10 MG tablet Generic drug: loratadine Take 10 mg by mouth daily.   DULoxetine 60 MG capsule Commonly known as: CYMBALTA Take 60 mg by mouth daily.   ferrous sulfate 325 (65 FE) MG tablet Take 325 mg by mouth daily with breakfast.   Flonase Allergy Relief 50 MCG/ACT nasal spray Generic drug: fluticasone 2 Sprays  by Nasal route nightly to relieve seasonal allergy   furosemide 20 MG tablet Commonly known as: LASIX Take 20 mg by mouth daily.   gabapentin 600 MG tablet Commonly known as: NEURONTIN Take 600 mg by mouth 3 (three) times daily.   HumaLOG KwikPen 100 UNIT/ML KwikPen Generic drug: insulin lispro CBG with meals101-150=6 units ,151-200=8units,201-250=10units,251-300=14 units,301-350=18units 351-400=22units,greater than 400=26 units   insulin glargine 100 UNIT/ML injection Commonly known as: LANTUS Inject 38 Units into the skin daily.   lisinopril 2.5 MG tablet Commonly known as: ZESTRIL Take 2.5 mg by mouth daily.   metoprolol tartrate 25 MG tablet Commonly known as: LOPRESSOR Take 12.5 mg by mouth 2 (two) times daily. 0.5 tablet to = 12.5 mg   pantoprazole 40 MG tablet Commonly known as: PROTONIX Take 40 mg by mouth daily.   polyethylene glycol 17 g packet Commonly known as: MIRALAX / GLYCOLAX Take 17 g by mouth daily.   potassium chloride 10 MEQ tablet Commonly known as: KLOR-CON Take 10 mEq by mouth daily.   PREVIDENT 5000 BOOSTER DT Place 1 application onto teeth daily. Brush on teeth with toothbrush after PM mouth care, spit out excess, do not rinse   promethazine 25 MG tablet Commonly known as: PHENERGAN Take 25 mg by mouth every 6 (six) hours as needed for nausea or vomiting.   Voltaren 1 % Gel Generic drug: diclofenac Sodium Apply 4 g topically as needed.       Review of Systems  Constitutional: Negative for appetite change, chills, fatigue and fever.  HENT: Negative for congestion, hearing loss, postnasal drip, rhinorrhea, sinus pressure, sinus pain, sneezing, sore throat and trouble swallowing.   Eyes: Negative for pain, discharge, redness, itching and visual disturbance.       Left eye " grit" feeling.  Respiratory: Negative for cough, chest tightness, shortness of breath and wheezing.        On chronic oxygen via nasal canula  Cardiovascular:  Negative for chest pain and leg swelling.  Gastrointestinal: Negative for abdominal distention, abdominal pain, constipation, diarrhea, nausea and vomiting.  Endocrine: Negative for cold intolerance, heat intolerance, polydipsia, polyphagia and polyuria.  Genitourinary: Negative for difficulty urinating, dysuria and urgency.       Incontinent   Musculoskeletal: Positive for arthralgias and gait problem. Negative for joint swelling, myalgias and neck pain.       Chronic right shoulder pain   Skin: Negative for color change, pallor and rash.  Neurological: Negative for dizziness, speech difficulty, light-headedness and headaches.  Hematological: Does not bruise/bleed easily.  Psychiatric/Behavioral: Negative for agitation, confusion and sleep disturbance. The patient is not nervous/anxious.     Immunization History  Administered Date(s) Administered  . Influenza, High Dose Seasonal PF 09/11/2016  . Influenza-Unspecified 09/07/2016, 09/25/2017, 10/10/2018  . Moderna SARS-COVID-2 Vaccination 01/07/2019, 02/04/2019  . Pneumococcal Polysaccharide-23 11/25/2017   Pertinent  Health Maintenance Due  Topic Date Due  . MAMMOGRAM  Never done  . COLONOSCOPY  Never done  . DEXA SCAN  Never done  . PNA vac Low Risk Adult (2 of 2 - PCV13) 11/26/2018  . INFLUENZA VACCINE  08/08/2019  . OPHTHALMOLOGY EXAM  11/24/2019 (Originally 07/06/1955)  . HEMOGLOBIN A1C  03/14/2020  . FOOT EXAM  03/15/2020   Fall Risk  11/21/2017  Falls in the past year? 0  Number falls in past yr: 0  Injury with Fall? 0   Functional Status Survey:    Vitals:   10/20/19 1020  BP: 113/67  Pulse: 71  Resp: 16  Temp: (!) 97.1 F (36.2 C)  SpO2: 99%  Weight: 228 lb (103.4 kg)  Height: 5' (1.524 m)   Body mass index is 44.53 kg/m. Physical Exam Vitals and nursing note reviewed.  Constitutional:      General: She is not in acute distress.    Appearance: She is morbidly obese. She is not ill-appearing.  HENT:      Head: Normocephalic.     Nose: Nose normal. No congestion or rhinorrhea.     Mouth/Throat:     Mouth: Mucous membranes are moist.     Pharynx: Oropharynx is clear. No oropharyngeal exudate or posterior oropharyngeal erythema.  Eyes:     General: No scleral icterus.       Right eye: No discharge.        Left eye: No discharge.     Extraocular Movements: Extraocular movements intact.     Conjunctiva/sclera: Conjunctivae normal.     Pupils: Pupils are equal, round, and reactive to light.  Cardiovascular:     Rate and Rhythm: Normal rate and regular rhythm.     Pulses: Normal pulses.     Heart sounds: Normal heart sounds. No murmur heard.  No friction rub. No gallop.   Pulmonary:     Effort: Pulmonary effort is normal. No respiratory distress.     Breath sounds: Normal breath sounds. No wheezing, rhonchi or rales.     Comments: On continuous 2 liters via nasal canula  Chest:     Chest wall: No tenderness.  Abdominal:     General: Bowel sounds are normal. There is no distension.     Palpations: Abdomen is soft. There is no mass.     Tenderness: There is no abdominal tenderness. There is no right CVA tenderness, left CVA tenderness, guarding or rebound.  Musculoskeletal:        General: No swelling or tenderness.     Cervical back: Normal range of motion. No rigidity or tenderness.     Right lower leg: No edema.     Left lower leg: No edema.     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  Skin:    General: Skin is warm and dry.     Coloration: Skin is not pale.     Findings: No bruising, erythema or rash.  Neurological:     Mental Status: She is alert and oriented to person, place, and time.     Cranial Nerves: No cranial nerve deficit.     Motor: Weakness present.     Gait: Gait abnormal.  Psychiatric:        Mood and Affect: Mood normal.        Speech: Speech normal.        Behavior: Behavior normal.         Thought Content: Thought content normal.    Labs reviewed: Recent  Labs    03/01/19 0000 05/20/19 0000 06/24/19 0000  NA 141 145 142  K 4.4 4.9 4.5  CL 99 102 103  CO2 29* 27* 28*  BUN 26* 23* 25*  CREATININE 1.0 0.9 1.0  CALCIUM 9.7 9.7 9.7    Recent Labs    12/07/18 0000 03/01/19 0000 05/20/19 0000  WBC 5.9 6.4 6.4  NEUTROABS 3 4  --   HGB 11.2* 10.7* 11.0*  HCT 36 35* 36  PLT 197 184 195   Lab Results  Component Value Date   TSH 2.23 10/06/2018   TSH 2.23 10/06/2018   Lab Results  Component Value Date   HGBA1C 9.5 09/15/2019   Lab Results  Component Value Date   CHOL 155 10/06/2018   CHOL 155 10/06/2018   HDL 48 10/06/2018   HDL 48 10/06/2018   LDLCALC 81 10/06/2018   LDLCALC 81 10/06/2018   TRIG 131 10/06/2018   TRIG 131 10/06/2018    Significant Diagnostic Results in last 30 days:  No results found.  Assessment/Plan 1. Type 2 diabetes mellitus with diabetic neuropathy, with long-term current use of insulin (HCC) Lab Results  Component Value Date   HGBA1C 9.5 09/15/2019  CBG log reviewed readings have improved compared to last visit.Lantus adjusted las visit to 38 units.No hypoglycemia reported. - will continue on Humalog and Lantus  - continue to follow up with Endocrinologist - ambulatory referral to Ophthalmologist for annual eye exam and evaluation of left eye " grit feeling "  - continue on ASA and BBB Not on statin.  - continue on ACE inhibitor for renal protection.on Gabapentin for Neuropathy.  - Pneumococcal vaccine I.M x 1 dose - Influenza vaccine to be administered by facility protocol if desired.     2. Essential hypertension B/p at goal. - continue on lisinopril 2.5 mg tablet daily,Furosemide 20 mg tablet daily and metoprolol 12.5 mg tablet twice daily.latest serum Cr reviewed is within normal range  - continue on ASA for prophylaxis. - CBC/diff,CMP,TSH level     3. Gastroesophageal reflux disease without  esophagitis Symptoms controlled. - continue on Protonix 40 mg tablet daily  - CBC/diff   4. Stage 3 chronic kidney disease, unspecified whether stage 3a or 3b CKD (HCC) Latest Cr at baseline. Continue to avoid nephrotoxin and dose all meds for renal clearance.   5. Polyneuropathy Continue on Gabapentin 600 mg tablet three ties daily.   6. Chronic respiratory failure with hypoxia (HCC) Breathing stable.continue on oxygen 2 liters via nasal canula  7. Need for Tdap vaccination Tdap 0.5 cc I.M X 1 dose to be administered at the facility.   8. Need for pneumococcal vaccination Second pneumococcal I.M   9. Encounter for hepatitis C screening test for low risk patient Hep C antibody   Family/ staff Communication:  Reviewed plan of care with patient and facility Nurse.   Labs/tests ordered:  CBC/diff,CMP,TSH level,lipid panel and Hep C antibody

## 2019-10-21 LAB — BASIC METABOLIC PANEL
BUN: 16 (ref 4–21)
CO2: 32 — AB (ref 13–22)
Chloride: 102 (ref 99–108)
Creatinine: 0.9 (ref 0.5–1.1)
Glucose: 134
Potassium: 4.5 (ref 3.4–5.3)
Sodium: 144 (ref 137–147)

## 2019-10-21 LAB — LIPID PANEL
Cholesterol: 151 (ref 0–200)
HDL: 48 (ref 35–70)
LDL Cholesterol: 82
Triglycerides: 105 (ref 40–160)

## 2019-10-21 LAB — CBC AND DIFFERENTIAL
HCT: 37 (ref 36–46)
Hemoglobin: 11.3 — AB (ref 12.0–16.0)
Platelets: 192 (ref 150–399)
WBC: 5.7

## 2019-10-21 LAB — CBC: RBC: 4.48 (ref 3.87–5.11)

## 2019-10-21 LAB — COMPREHENSIVE METABOLIC PANEL
Albumin: 4 (ref 3.5–5.0)
Calcium: 9.8 (ref 8.7–10.7)
GFR calc Af Amer: 75.63
GFR calc non Af Amer: 65.26
Globulin: 2.6

## 2019-10-21 LAB — HEPATIC FUNCTION PANEL
ALT: 24 (ref 7–35)
AST: 21 (ref 13–35)

## 2019-11-01 LAB — HEMOGLOBIN A1C: Hemoglobin A1C: 9.5

## 2019-11-15 ENCOUNTER — Encounter (HOSPITAL_COMMUNITY): Payer: Self-pay | Admitting: Pediatrics

## 2019-11-15 ENCOUNTER — Non-Acute Institutional Stay (SKILLED_NURSING_FACILITY): Payer: Medicare Other | Admitting: Family

## 2019-11-15 ENCOUNTER — Other Ambulatory Visit: Payer: Self-pay

## 2019-11-15 ENCOUNTER — Encounter: Payer: Self-pay | Admitting: Family

## 2019-11-15 ENCOUNTER — Emergency Department (HOSPITAL_COMMUNITY)
Admission: EM | Admit: 2019-11-15 | Discharge: 2019-11-15 | Disposition: A | Discharge status: Home / Self Care | Payer: Medicare Other | Attending: Emergency Medicine | Admitting: Emergency Medicine

## 2019-11-15 ENCOUNTER — Emergency Department (HOSPITAL_COMMUNITY): Payer: Medicare Other

## 2019-11-15 DIAGNOSIS — R410 Disorientation, unspecified: Secondary | ICD-10-CM | POA: Diagnosis not present

## 2019-11-15 DIAGNOSIS — I129 Hypertensive chronic kidney disease with stage 1 through stage 4 chronic kidney disease, or unspecified chronic kidney disease: Secondary | ICD-10-CM | POA: Insufficient documentation

## 2019-11-15 DIAGNOSIS — Z96653 Presence of artificial knee joint, bilateral: Secondary | ICD-10-CM | POA: Insufficient documentation

## 2019-11-15 DIAGNOSIS — Z794 Long term (current) use of insulin: Secondary | ICD-10-CM | POA: Diagnosis not present

## 2019-11-15 DIAGNOSIS — Z7982 Long term (current) use of aspirin: Secondary | ICD-10-CM | POA: Insufficient documentation

## 2019-11-15 DIAGNOSIS — Z79899 Other long term (current) drug therapy: Secondary | ICD-10-CM | POA: Insufficient documentation

## 2019-11-15 DIAGNOSIS — S0083XA Contusion of other part of head, initial encounter: Secondary | ICD-10-CM | POA: Diagnosis not present

## 2019-11-15 DIAGNOSIS — W06XXXA Fall from bed, initial encounter: Secondary | ICD-10-CM | POA: Diagnosis not present

## 2019-11-15 DIAGNOSIS — N182 Chronic kidney disease, stage 2 (mild): Secondary | ICD-10-CM | POA: Insufficient documentation

## 2019-11-15 DIAGNOSIS — E1122 Type 2 diabetes mellitus with diabetic chronic kidney disease: Secondary | ICD-10-CM | POA: Insufficient documentation

## 2019-11-15 DIAGNOSIS — E1100 Type 2 diabetes mellitus with hyperosmolarity without nonketotic hyperglycemic-hyperosmolar coma (NKHHC): Secondary | ICD-10-CM | POA: Insufficient documentation

## 2019-11-15 DIAGNOSIS — Z86711 Personal history of pulmonary embolism: Secondary | ICD-10-CM | POA: Diagnosis not present

## 2019-11-15 DIAGNOSIS — Z8616 Personal history of COVID-19: Secondary | ICD-10-CM | POA: Diagnosis not present

## 2019-11-15 DIAGNOSIS — Z87891 Personal history of nicotine dependence: Secondary | ICD-10-CM | POA: Diagnosis not present

## 2019-11-15 DIAGNOSIS — W19XXXA Unspecified fall, initial encounter: Secondary | ICD-10-CM

## 2019-11-15 MED ORDER — ACETAMINOPHEN 325 MG PO TABS
650.0000 mg | ORAL_TABLET | Freq: Once | ORAL | Status: AC
  Administered 2019-11-15: 650 mg via ORAL
  Filled 2019-11-15: qty 2

## 2019-11-15 NOTE — ED Provider Notes (Signed)
[facility]   CSN: [id number] Arrival date & time: 11/15/19  1126     History Chief Complaint  Patient presents with  . Fall    Kristine Missther H Nanney is a [age] y.o. female.  Patient reports that she accidentally rolled out of bed this morning. She hit her head on the ground, did not lose consciousness. She has had diffuse achiness but denies specific painful areas. She has no complaints at this time.   The history is provided by the patient.  Fall This is a new problem. The current episode started 6 to 12 hours ago. Episode frequency: once. The problem has been resolved. Pertinent negatives include no chest pain, no abdominal pain and no shortness of breath. Nothing aggravates the symptoms. Nothing relieves the symptoms. She has tried nothing for the symptoms.       Past Medical History:  Diagnosis Date  . Arthritis   . Depression   . Hypertension   . Pulmonary embolism [facility](HCC)     Patient Active Problem List   Diagnosis Date Noted  . Diabetes mellitus type 2, uncontrolled (HCC) 12/19/2018  . COVID-19 virus infection 07/29/2018  . Hyperosmolar hyperglycemic coma due to diabetes mellitus without ketoacidosis (HCC) 02/21/2018  . New onset type 2 diabetes mellitus (HCC) 02/21/2018  . Hypernatremia 02/21/2018  . Hypokalemia 02/21/2018  . Decubitus ulcer of coccygeal region, stage 2 (HCC) 02/21/2018  . GERD (gastroesophageal reflux disease) 12/21/2017  . Declining functional status 08/18/2017  . Decreased functional mobility 08/18/2017  . CKD (chronic kidney disease) stage 2, GFR 60-89 ml/min 06/02/2017  . Arthritis 06/02/2017  . GI bleed 02/15/2017  . Acute blood loss anemia 02/15/2017  . Escherichia coli urinary tract infection 02/15/2017  . Respiratory failure, chronic (HCC) 02/15/2017  . Edema of upper extremity 02/15/2017  . Bilateral lower extremity edema 02/15/2017  . Pulmonary embolism (HCC) 02/08/2017  . Hypertension  02/08/2017  . Polyneuropathy 02/08/2017  . Depression 02/08/2017    Past Surgical History:  Procedure Laterality Date  . ABDOMINAL HYSTERECTOMY    . BACK SURGERY    . REPLACEMENT TOTAL KNEE BILATERAL       OB History   No obstetric history on file.     Family History  Problem Relation Age of Onset  . Cancer Mother   . Hypertension Mother   . Diabetes Mother   . Hypertension Father   . Cancer Sister   . Diabetes Sister   . Diabetes Maternal Grandmother   . Hypertension Maternal Grandmother   . Diabetes Maternal Grandfather   . Hypertension Maternal Grandfather     Social History   Tobacco Use  . Smoking status: Former Smoker    Years: 55.00    Quit date: 04/2016    Years since quitting: 3.6  . Smokeless tobacco: Never Used  Vaping Use  . Vaping Use: Never used  Substance Use Topics  . Alcohol use: No  . Drug use: No    Home Medications Prior to Admission medications   Medication Sig Start Date End Date Taking? Authorizing Provider  acetaminophen (TYLENOL) 325 MG tablet Take 650 mg by mouth every 6 (six) hours as needed for mild pain or fever.     [provider]  acetaminophen (TYLENOL) 500 MG tablet Take 1,000 mg by mouth 2 (two) times daily.     [provider]  albuterol (VENTOLIN HFA) 108 (90 Base) MCG/ACT inhaler Inhale 2 puffs into the lungs 3 (three) times  daily.    [provider]  aspirin EC 81 MG tablet Take 81 mg by mouth daily.    [provider]  DULoxetine (CYMBALTA) 60 MG capsule Take 60 mg by mouth daily.    [provider]  ferrous sulfate 325 (65 FE) MG tablet Take 325 mg by mouth daily with breakfast.    [provider]  fluticasone (FLONASE ALLERGY RELIEF) 50 MCG/ACT nasal spray 2 Sprays by Nasal route nightly to relieve seasonal allergy    [provider]  furosemide (LASIX) 20 MG tablet Take 20 mg by mouth daily.    [provider]  gabapentin (NEURONTIN) 600 MG tablet  Take 600 mg by mouth 3 (three) times daily.    [provider]  insulin glargine (LANTUS) 100 UNIT/ML injection Inject 38 Units into the skin daily.     [provider]  insulin lispro (HUMALOG KWIKPEN) 100 UNIT/ML KwikPen CBG with meals101-150=6 units ,151-200=8units,201-250=10units,251-300=14 units,301-350=18units 351-400=22units,greater than 400=26 units    [provider]  lisinopril (ZESTRIL) 2.5 MG tablet Take 2.5 mg by mouth daily.    [provider]  loratadine (CLARITIN) 10 MG tablet Take 10 mg by mouth daily.     [provider]  metoprolol tartrate (LOPRESSOR) 25 MG tablet Take 12.5 mg by mouth 2 (two) times daily. 0.5 tablet to = 12.5 mg    [provider]  pantoprazole (PROTONIX) 40 MG tablet Take 40 mg by mouth daily.    [provider]  polyethylene glycol (MIRALAX / GLYCOLAX) packet Take 17 g by mouth daily.    [provider]  potassium chloride (K-DUR,KLOR-CON) 10 MEQ tablet Take 10 mEq by mouth daily.    [provider]  promethazine (PHENERGAN) 25 MG tablet Take 25 mg by mouth every 6 (six) hours as needed for nausea or vomiting.    [provider]  Sodium Fluoride (PREVIDENT 5000 BOOSTER DT) Place 1 application onto teeth daily. Brush on teeth with toothbrush after PM mouth care, spit out excess, do not rinse    [provider]    Allergies    Patient has no known allergies.  Review of Systems   Review of Systems  Constitutional: Negative for chills and fever.  HENT: Negative for ear pain and sore throat.   Eyes: Negative for pain and visual disturbance.  Respiratory: Negative for cough and shortness of breath.   Cardiovascular: Negative for chest pain and palpitations.  Gastrointestinal: Negative for abdominal pain and vomiting.  Genitourinary: Negative for dysuria and hematuria.  Musculoskeletal: Negative for arthralgias and back pain.  Skin: Negative for color change  and rash.  Neurological: Negative for seizures and syncope.  All other systems reviewed and are negative.   Physical Exam Updated Vital Signs BP (!) 164/115 (BP Location: Right Arm)   Pulse (!) 106   Temp 98.5 F (36.9 C) (Oral)   Resp 20   SpO2 98%   Physical Exam Vitals and nursing note reviewed.  Constitutional:      Appearance: She is well-developed. She is obese. She is not ill-appearing, toxic-appearing or diaphoretic.  HENT:     Head: Normocephalic. Contusion (to forehead) present.     Right Ear: External ear normal.     Left Ear: External ear normal.     Nose: Nose normal.     Right Nostril: No epistaxis or septal hematoma.     Left Nostril: No epistaxis or septal hematoma.     Mouth/Throat:  Mouth: Mucous membranes are moist.     Pharynx: Oropharynx is clear.  Eyes:     Conjunctiva/sclera: Conjunctivae normal.  Cardiovascular:     Rate and Rhythm: Normal rate and regular rhythm.     Heart sounds: No murmur heard.  No gallop.   Pulmonary:     Effort: Pulmonary effort is normal. No respiratory distress.     Breath sounds: Normal breath sounds. No wheezing or rhonchi.  Abdominal:     Palpations: Abdomen is soft.     Tenderness: There is no abdominal tenderness.  Musculoskeletal:     Cervical back: Neck supple.  Skin:    General: Skin is warm and dry.     Capillary Refill: Capillary refill takes less than 2 seconds.  Neurological:     Mental Status: She is alert.     GCS: GCS eye subscore is 4. GCS verbal subscore is 5. GCS motor subscore is 6.     Comments: Moves all extremities spontaneously, intact to sensation on all extremities     ED Results / Procedures / Treatments   Labs (all labs ordered are listed, but only abnormal results are displayed) Labs Reviewed - No data to display  EKG None  Radiology CT Head Wo Contrast  Result Date: 11/15/2019 CLINICAL DATA:  Fall.  Head trauma.  02/10/2018 EXAM: CT HEAD WITHOUT CONTRAST TECHNIQUE:  Contiguous axial images were obtained from the base of the skull through the vertex without intravenous contrast. COMPARISON:  None. FINDINGS: Brain: There is no evidence for acute hemorrhage, hydrocephalus, mass lesion, or abnormal extra-axial fluid collection. No definite CT evidence for acute infarction. Diffuse loss of parenchymal volume is consistent with atrophy. Patchy low attenuation in the deep hemispheric and periventricular white matter is nonspecific, but likely reflects chronic microvascular ischemic demyelination. Stable prominence of the lateral ventricles. Vascular: No hyperdense vessel or unexpected calcification. Skull: No evidence for fracture. No worrisome lytic or sclerotic lesion. Sinuses/Orbits: The visualized paranasal sinuses and mastoid air cells are clear. Visualized portions of the globes and intraorbital fat are unremarkable. Other: Frontal scalp contusion evident. IMPRESSION: 1. No acute intracranial abnormality. 2. Atrophy with chronic small vessel white matter ischemic disease. 3. Frontal scalp contusion. Electronically Signed   By: Kennith Center M.D.   On: 11/15/2019 14:24    Procedures Procedures (including critical care time)  Medications Ordered in ED Medications  acetaminophen (TYLENOL) tablet 650 mg (has no administration in time range)    ED Course  I have reviewed the triage vital signs and the nursing notes.  Pertinent labs & imaging results that were available during my care of the patient were reviewed by me and considered in my medical decision making (see chart for details).    MDM Rules/Calculators/A&P                          The patient is a 74yo female, PMH HTN, CKD, chronic hypoxic respiratory failure on 2L o2 [place] at baseline, wheelchair bound from SNF who presents to the ED for fall.  On my initial evaluation, the patient is hemodynamically stable, afebrile, nontoxic-appearing. Physical exam remarkable for hematoma to forehead, no deformities,  lacerations, other evidence of trauma.  CT head obtained prior to my evaluation unremarkable. Low supsicion for C spine injury, fractures, other internal injuries since patient has remained stable despite 9hrs in the waiting room. Tylenol provided for myalgias. Do not think patient requires further labs or imaging at this time.  Advised patient of concern for fall with hematoma to forehead. Advised treatment of symptoms with tylenol as needed for pain. Recommended follow-up with PCP in the next couple days. Strict return precautions provided. Patient discharged in stable condition.   The care of this patient was overseen by Dr. Jacqulyn Bath, who agreed with evaluation and plan of care.   Final Clinical Impression(s) / ED Diagnoses Final diagnoses:  Fall, initial encounter    Rx / DC Orders ED Discharge Orders    None       Loletha Carrow, MD 11/15/19 2127    Maia Plan, MD 11/18/19 [phone]

## 2019-11-15 NOTE — Progress Notes (Signed)
Location:    [facility].   Nursing Home Room Number: 216-W Place of Service:  SNF (31) Provider:  Richarda Blade, NP     Patient Care Team: Kermit Balo, DO as PCP - General (Geriatric Medicine)  Extended Emergency Contact Information Primary Emergency Contact: Laney Potash Address: [address]          [place], [place] [address] D[place] of [place] Home Phone: [phone] Relation: Son Preferred language: English Interpreter needed? No Secondary Emergency Contact: Marcille Buffy Home Phone: [phone] Relation: Sister  Code Status:  DNR Goals of care: Advanced Directive information Advanced Directives 11/15/2019  Does Patient Have a Medical Advance Directive? Yes  Type of Advance Directive Out of facility DNR (pink MOST or yellow form)  Does patient want to make changes to medical advance directive? No - Patient declined  Would patient like information on creating a medical advance directive? -  Pre-existing out of facility DNR order (yellow form or pink MOST form) -     Chief Complaint  Patient presents with  . Acute Visit    Fall,hematoma on forehead and increased confusion     HPI:  Pt is a 74 y.o. female seen today for an acute visit for    Past Medical History:  Diagnosis Date  . Arthritis   . Depression   . Hypertension   . Pulmonary embolism [facility])    Past Surgical History:  Procedure Laterality Date  . ABDOMINAL HYSTERECTOMY    . BACK SURGERY    . REPLACEMENT TOTAL KNEE BILATERAL      No Known Allergies  Allergies as of 11/15/2019   No Known Allergies     Medication List       Accurate as of November 15, 2019 10:43 AM. If you have any questions, ask your nurse or doctor.        STOP taking these medications   Voltaren 1 % Gel Generic drug: diclofenac Sodium Stopped by: Caesar Bookman, NP     TAKE these medications   acetaminophen 325 MG tablet Commonly known as: TYLENOL Take 650 mg by mouth every 6  (six) hours as needed for mild pain or fever.   acetaminophen 500 MG tablet Commonly known as: TYLENOL Take 1,000 mg by mouth 2 (two) times daily.   albuterol 108 (90 Base) MCG/ACT inhaler Commonly known as: VENTOLIN HFA Inhale 2 puffs into the lungs 3 (three) times daily.   aspirin EC 81 MG tablet Take 81 mg by mouth daily.   Claritin 10 MG tablet Generic drug: loratadine Take 10 mg by mouth daily.   DULoxetine 60 MG capsule Commonly known as: CYMBALTA Take 60 mg by mouth daily.   ferrous sulfate 325 (65 FE) MG tablet Take 325 mg by mouth daily with breakfast.   Flonase Allergy Relief 50 MCG/ACT nasal spray Generic drug: fluticasone 2 Sprays by Nasal route nightly to relieve seasonal allergy   furosemide 20 MG tablet Commonly known as: LASIX Take 20 mg by mouth daily.   gabapentin 600 MG tablet Commonly known as: NEURONTIN Take 600 mg by mouth 3 (three) times daily.   HumaLOG KwikPen 100 UNIT/ML KwikPen Generic drug: insulin lispro CBG with meals101-150=6 units ,151-200=8units,201-250=10units,251-300=14 units,301-350=18units 351-400=22units,greater than 400=26 units   insulin glargine 100 UNIT/ML injection Commonly known as: LANTUS Inject 38 Units into the skin daily.   lisinopril 2.5 MG tablet Commonly known as: ZESTRIL Take 2.5 mg by mouth daily.   metoprolol tartrate 25 MG  tablet Commonly known as: LOPRESSOR Take 12.5 mg by mouth 2 (two) times daily. 0.5 tablet to = 12.5 mg   pantoprazole 40 MG tablet Commonly known as: PROTONIX Take 40 mg by mouth daily.   polyethylene glycol 17 g packet Commonly known as: MIRALAX / GLYCOLAX Take 17 g by mouth daily.   potassium chloride 10 MEQ tablet Commonly known as: KLOR-CON Take 10 mEq by mouth daily.   PREVIDENT 5000 BOOSTER DT Place 1 application onto teeth daily. Brush on teeth with toothbrush after PM mouth care, spit out excess, do not rinse   promethazine 25 MG tablet Commonly known as:  PHENERGAN Take 25 mg by mouth every 6 (six) hours as needed for nausea or vomiting.       Review of Systems  Immunization History  Administered Date(s) Administered  . Influenza, High Dose Seasonal PF 09/11/2016  . Influenza-Unspecified 09/07/2016, 09/25/2017, 10/10/2018  . Moderna SARS-COVID-2 Vaccination 01/07/2019, 02/04/2019  . Pneumococcal Polysaccharide-23 11/25/2017  . Pneumococcal-Unspecified 10/20/2019   Pertinent  Health Maintenance Due  Topic Date Due  . MAMMOGRAM  Never done  . COLONOSCOPY  Never done  . DEXA SCAN  Never done  . INFLUENZA VACCINE  08/08/2019  . OPHTHALMOLOGY EXAM  11/24/2019 (Originally 07/06/1955)  . FOOT EXAM  03/15/2020  . HEMOGLOBIN A1C  05/01/2020  . PNA vac Low Risk Adult (2 of 2 - PCV13) 10/19/2020   Fall Risk  11/21/2017  Falls in the past year? 0  Number falls in past yr: 0  Injury with Fall? 0   Functional Status Survey:    Vitals:   11/15/19 1028  BP: 118/74  Pulse: (!) 56  Resp: 17  Temp: (!) 97 F (36.1 C)  SpO2: 96%  Weight: 223 lb (101.2 kg)  Height: 5' (1.524 m)   Body mass index is 43.55 kg/m. Physical Exam  Labs reviewed: Recent Labs    05/20/19 0000 06/24/19 0000 10/21/19 0000  NA 145 142 144  K 4.9 4.5 4.5  CL 102 103 102  CO2 27* 28* 32*  BUN 23* 25* 16  CREATININE 0.9 1.0 0.9  CALCIUM 9.7 9.7 9.8   Recent Labs    10/21/19 0000  AST 21  ALT 24  ALBUMIN 4.0   Recent Labs    12/07/18 0000 12/07/18 0000 03/01/19 0000 05/20/19 0000 10/21/19 0000  WBC 5.9   < > 6.4 6.4 5.7  NEUTROABS 3  --  4  --   --   HGB 11.2*   < > 10.7* 11.0* 11.3*  HCT 36   < > 35* 36 37  PLT 197   < > 184 195 192   < > = values in this interval not displayed.   Lab Results  Component Value Date   TSH 2.23 10/06/2018   TSH 2.23 10/06/2018   Lab Results  Component Value Date   HGBA1C 9.5 11/01/2019   Lab Results  Component Value Date   CHOL 151 10/21/2019   HDL 48 10/21/2019   LDLCALC 82 10/21/2019    TRIG 105 10/21/2019    Significant Diagnostic Results in last 30 days:  No results found.  Assessment/Plan There are no diagnoses linked to this encounter.   Family/ staff Communication:   Labs/tests ordered:

## 2019-11-15 NOTE — Progress Notes (Signed)
[facility]  [facility] Room Number: 216-W Place of Service:  SNF [phone]) Provider: Reannon Candella FNP-C  Kermit Baloeed, Tiffany L, DO  Patient Care Team: Kermit Baloeed, Tiffany L, DO as PCP - General (Geriatric Medicine)  Extended Emergency Contact Information Primary Emergency Contact: Laney PotashHarris, Clarence Address: [address]          [place], [place] [address] [place] Home Phone: [phone] Relation: Son Preferred language: English Interpreter needed? No Secondary Emergency Contact: Marcille Buffyroberts, lillian Home Phone: [phone] Relation: Sister  Code Status:  DNR Goals of care: Advanced Directive information Advanced Directives 11/15/2019  Does Patient Have a Medical Advance Directive? Yes  Type of Advance Directive Out of facility DNR (pink MOST or yellow form)  Does patient want to make changes to medical advance directive? No - Patient declined  Would patient like information on creating a medical advance directive? -  Pre-existing out of facility DNR order (yellow form or pink MOST form) -     Chief Complaint  Patient presents with  . Acute Visit    Fall sending to hospital ASAP.    HPI:  Pt is a 74 y.o. female seen today for an acute visit for evaluation of fall.Provider was called by staff to patient's room to evaluate for fall.she has a medical history of Hypertension,Type 2 DM with peripheral Neuropathy,CKD stage 3,Major depression,chronic respiratory failure with hypoxia on oxygen via nasal cannula,and hx of PE.Observed patient on the fall being assisted off the floor via hoyer lift.She states does not recall falling on the floor.Has a hematoma to forehead. Fall was unwitnessed.No loss of consciousness noted.Has had increased confusion per Nurse.No fever,chills or worsening cough reported. She has chronic pain to right shoulder pain from arthritis.    Past Medical History:  Diagnosis Date  . Arthritis   . Depression   . Hypertension   .  Pulmonary embolism [facility](HCC)    Past Surgical History:  Procedure Laterality Date  . ABDOMINAL HYSTERECTOMY    . BACK SURGERY    . REPLACEMENT TOTAL KNEE BILATERAL      No Known Allergies  Outpatient Encounter Medications as of 11/15/2019  Medication Sig  . acetaminophen (TYLENOL) 325 MG tablet Take 650 mg by mouth every 6 (six) hours as needed for mild pain or fever.   Marland Kitchen. acetaminophen (TYLENOL) 500 MG tablet Take 1,000 mg by mouth 2 (two) times daily.   Marland Kitchen. albuterol (VENTOLIN HFA) 108 (90 Base) MCG/ACT inhaler Inhale 2 puffs into the lungs 3 (three) times daily.  Marland Kitchen. aspirin EC 81 MG tablet Take 81 mg by mouth daily.  . DULoxetine (CYMBALTA) 60 MG capsule Take 60 mg by mouth daily.  . ferrous sulfate 325 (65 FE) MG tablet Take 325 mg by mouth daily with breakfast.  . fluticasone (FLONASE ALLERGY RELIEF) 50 MCG/ACT nasal spray 2 Sprays by Nasal route nightly to relieve seasonal allergy  . furosemide (LASIX) 20 MG tablet Take 20 mg by mouth daily.  Marland Kitchen. gabapentin (NEURONTIN) 600 MG tablet Take 600 mg by mouth 3 (three) times daily.  . insulin glargine (LANTUS) 100 UNIT/ML injection Inject 38 Units into the skin daily.   . insulin lispro (HUMALOG KWIKPEN) 100 UNIT/ML KwikPen CBG with meals101-150=6 units ,151-200=8units,201-250=10units,251-300=14 units,301-350=18units 351-400=22units,greater than 400=26 units  . lisinopril (ZESTRIL) 2.5 MG tablet Take 2.5 mg by mouth daily.  Marland Kitchen. loratadine (CLARITIN) 10 MG tablet Take 10 mg by mouth daily.   . metoprolol tartrate (LOPRESSOR) 25 MG tablet Take 12.5  mg by mouth 2 (two) times daily. 0.5 tablet to = 12.5 mg  . pantoprazole (PROTONIX) 40 MG tablet Take 40 mg by mouth daily.  . polyethylene glycol (MIRALAX / GLYCOLAX) packet Take 17 g by mouth daily.  . potassium chloride (K-DUR,KLOR-CON) 10 MEQ tablet Take 10 mEq by mouth daily.  . promethazine (PHENERGAN) 25 MG tablet Take 25 mg by mouth every 6 (six) hours as needed for nausea or vomiting.  . Sodium  Fluoride (PREVIDENT 5000 BOOSTER DT) Place 1 application onto teeth daily. Brush on teeth with toothbrush after PM mouth care, spit out excess, do not rinse  . [DISCONTINUED] diclofenac Sodium (VOLTAREN) 1 % GEL Apply 4 g topically as needed.   No facility-administered encounter medications on file as of 11/15/2019.    Review of Systems  Constitutional: Negative for appetite change, chills, fatigue and fever.  HENT: Negative for congestion, postnasal drip, rhinorrhea, sinus pressure, sinus pain, sneezing and sore throat.   Eyes: Negative for discharge, redness and itching.  Respiratory: Negative for cough, chest tightness, shortness of breath and wheezing.        Chronic respiratory failure on oxygen via nasal canula  Cardiovascular: Negative for chest pain, palpitations and leg swelling.  Gastrointestinal: Negative for abdominal distention, abdominal pain, constipation, diarrhea, nausea and vomiting.  Endocrine: Negative for cold intolerance, heat intolerance, polydipsia, polyphagia and polyuria.  Genitourinary:       Incontinent   Musculoskeletal: Positive for arthralgias and gait problem. Negative for joint swelling and myalgias.  Skin: Negative for color change, pallor and rash.       Forehead hematoma  Neurological: Positive for weakness and numbness. Negative for dizziness, speech difficulty, light-headedness and headaches.  Hematological: Does not bruise/bleed easily.  Psychiatric/Behavioral: Positive for confusion. Negative for agitation, behavioral problems, hallucinations and sleep disturbance. The patient is not nervous/anxious.     Immunization History  Administered Date(s) Administered  . Influenza, High Dose Seasonal PF 09/11/2016  . Influenza-Unspecified 09/07/2016, 09/25/2017, 10/10/2018  . Moderna SARS-COVID-2 Vaccination 01/07/2019, 02/04/2019  . Pneumococcal Polysaccharide-23 11/25/2017  . Pneumococcal-Unspecified 10/20/2019   Pertinent  Health Maintenance Due    Topic Date Due  . MAMMOGRAM  Never done  . COLONOSCOPY  Never done  . DEXA SCAN  Never done  . INFLUENZA VACCINE  08/08/2019  . OPHTHALMOLOGY EXAM  11/24/2019 (Originally 07/06/1955)  . FOOT EXAM  03/15/2020  . HEMOGLOBIN A1C  05/01/2020  . PNA vac Low Risk Adult (2 of 2 - PCV13) 10/19/2020   Fall Risk  11/21/2017  Falls in the past year? 0  Number falls in past yr: 0  Injury with Fall? 0   Functional Status Survey:    Vitals:   11/15/19 1028  BP: 118/74  Pulse: (!) 56  Resp: 17  Temp: (!) 97 F (36.1 C)  SpO2: 96%  Weight: 223 lb (101.2 kg)  Height: 5' (1.524 m)   Body mass index is 43.55 kg/m. Physical Exam Vitals and nursing note reviewed.  Constitutional:      General: She is not in acute distress.    Appearance: She is morbidly obese. She is not ill-appearing.     Comments: Confused   HENT:     Head: Normocephalic.     Nose: Nose normal. No congestion or rhinorrhea.     Mouth/Throat:     Mouth: Mucous membranes are moist.     Pharynx: Oropharynx is clear. No oropharyngeal exudate or posterior oropharyngeal erythema.     Comments: missing teeth  Eyes:     General: No scleral icterus.       Right eye: No discharge.        Left eye: No discharge.     Extraocular Movements: Extraocular movements intact.     Conjunctiva/sclera: Conjunctivae normal.     Pupils: Pupils are equal, round, and reactive to light.  Cardiovascular:     Rate and Rhythm: Normal rate and regular rhythm.     Pulses: Normal pulses.  Pulmonary:     Effort: Pulmonary effort is normal. No respiratory distress.     Breath sounds: Normal breath sounds. No wheezing, rhonchi or rales.     Comments: Oxygen via nasal cannula in place  Chest:     Chest wall: No tenderness.  Abdominal:     General: Bowel sounds are normal. There is no distension.     Palpations: Abdomen is soft. There is no mass.     Tenderness: There is no abdominal tenderness. There is no guarding or rebound.   Musculoskeletal:        General: No swelling or tenderness.     Cervical back: No rigidity or tenderness.     Right lower leg: No edema.     Left lower leg: No edema.     Comments: Right shoulder chronic decreased ROM Bilateral foot drop    Lymphadenopathy:     Cervical: No cervical adenopathy.  Skin:    General: Skin is warm and dry.     Coloration: Skin is not pale.     Findings: No bruising or erythema.     Comments: Forehead hematoma tender to touch.ice pack applied by Nurse.nasal bridge also tender to palpation.   Neurological:     Mental Status: She is alert.     Cranial Nerves: No cranial nerve deficit.     Motor: No weakness.     Gait: Gait abnormal.     Comments: Alert and oriented to self and person  Psychiatric:        Mood and Affect: Mood normal.        Behavior: Behavior normal.        Thought Content: Thought content normal.        Judgment: Judgment normal.    Labs reviewed: Recent Labs    03/01/19 0000 05/20/19 0000 06/24/19 0000  NA 141 145 142  K 4.4 4.9 4.5  CL 99 102 103  CO2 29* 27* 28*  BUN 26* 23* 25*  CREATININE 1.0 0.9 1.0  CALCIUM 9.7 9.7 9.7    Recent Labs    12/07/18 0000 03/01/19 0000 05/20/19 0000  WBC 5.9 6.4 6.4  NEUTROABS 3 4  --   HGB 11.2* 10.7* 11.0*  HCT 36 35* 36  PLT 197 184 195   Lab Results  Component Value Date   TSH 2.23 10/06/2018   TSH 2.23 10/06/2018   Lab Results  Component Value Date   HGBA1C 9.5 11/01/2019   Lab Results  Component Value Date   CHOL 155 10/06/2018   CHOL 155 10/06/2018   HDL 48 10/06/2018   HDL 48 10/06/2018   LDLCALC 81 10/06/2018   LDLCALC 81 10/06/2018   TRIG 131 10/06/2018   TRIG 131 10/06/2018    Significant Diagnostic Results in last 30 days:  No results found.  Assessment/Plan 1. Contusion of forehead, initial encounter Forehead large hematoma sustained post fall prior to visit today.Ice pack applied. Tender to palpate.No loss of consciousness but confused.  -  will send  to ED for evaluation might need CT scan to evaluate for bleeding.   2. Confusion Afebrile. Has increased confusion. Send to ED for further evaluation.   Family/ staff Communication: Reviewed plan of care with patient and facility Nurse.   Labs/tests ordered: Send to Ed for further evaluation.   Next Appointment: Send to ED  Caesar Bookman, NP

## 2019-11-15 NOTE — ED Triage Notes (Signed)
Arrived via EMS; reported fall from bed and sustained a hematoma on forehead. Denies blood thinners and LOC.

## 2019-12-01 ENCOUNTER — Non-Acute Institutional Stay (SKILLED_NURSING_FACILITY): Payer: Medicare Other | Admitting: Orthopedic Surgery

## 2019-12-01 ENCOUNTER — Encounter: Payer: Self-pay | Admitting: Orthopedic Surgery

## 2019-12-01 DIAGNOSIS — I10 Essential (primary) hypertension: Secondary | ICD-10-CM

## 2019-12-01 DIAGNOSIS — H6120 Impacted cerumen, unspecified ear: Secondary | ICD-10-CM

## 2019-12-01 DIAGNOSIS — N183 Chronic kidney disease, stage 3 unspecified: Secondary | ICD-10-CM

## 2019-12-01 DIAGNOSIS — K219 Gastro-esophageal reflux disease without esophagitis: Secondary | ICD-10-CM

## 2019-12-01 DIAGNOSIS — E1140 Type 2 diabetes mellitus with diabetic neuropathy, unspecified: Secondary | ICD-10-CM | POA: Diagnosis not present

## 2019-12-01 DIAGNOSIS — G629 Polyneuropathy, unspecified: Secondary | ICD-10-CM

## 2019-12-01 DIAGNOSIS — Z794 Long term (current) use of insulin: Secondary | ICD-10-CM

## 2019-12-01 DIAGNOSIS — J9611 Chronic respiratory failure with hypoxia: Secondary | ICD-10-CM

## 2019-12-01 NOTE — Progress Notes (Signed)
Location:    [facility] Room Number: 216/W Place of Service:  SNF [phone]) Provider:  Travin Marik AGNP-C  Kermit Balo, DO  Patient Care Team: Kermit Balo, DO as PCP - General (Geriatric Medicine)  Extended Emergency Contact Information Primary Emergency Contact: Laney Potash Address: [address]          [place], [place] [address] [place] Home Phone: [phone] Relation: Son Preferred language: English Interpreter needed? No Secondary Emergency Contact: Marcille Buffy Home Phone: [phone] Relation: Sister  Code Status:DNR   Goals of care: Advanced Directive information Advanced Directives 12/01/2019  Does Patient Have a Medical Advance Directive? Yes  Type of Advance Directive Out of facility DNR (pink MOST or yellow form)  Does patient want to make changes to medical advance directive? No - Patient declined  Would patient like information on creating a medical advance directive? -  Pre-existing out of facility DNR order (yellow form or pink MOST form) Yellow form placed in chart (order not valid for inpatient use)     Chief Complaint  Patient presents with  . Medical Management of Chronic Issues    Routine Visit of Medical Management    HPI:  Pt is a 74 y.o. female seen today for medical management of chronic diseases.    She is a resident of [facility], seen at bedside today. PMH includes: hypertension, chronic respiratory failure, GERD, polyneuropathy, T2DM, chronic kidney disease stage 2, and decreased functional ability. On 11/8 she rolled out of bed and bumped her head. She did not lose consciousness, but a moderate sized hematoma developed on her forehead. She was taken to [facility] for further evaluation. CT of head was unremarkable. Since incident, there have been no other falls. Her hematoma has subsided. She is very pleasant this morning and happy to have company. She  was able to recall incident of rolling out of bed with no issues. Denies any issues with memory, headaches, or excessive sleepiness.   Wears continuous oxygen at [facility]. States she has trouble breathing sometimes with transfers from bed to wheelchair. Denies any trouble breathing at rest.   She continues to require skilled nursing care due to immobility.Continues to be bed bound and maximum assist with transfers. She tries to get in the wheelchair 1-2 times a week. While in bed she has trouble turning herself, cannot move lower limbs well. States her upper strength is good and she can feeding herself, use phone, and put a shirt on with minimal assistance.   Her weight has continued to decline within the past few months:  08/13- 228.6 lbs  09/13- 231.4 lbs  10/15- 223 lbs  11/15- 219 lbs Claims she has a healthy appetite. Looking forward to her son visiting tomorrow with leftovers. I observed her eat 100% of her breakfast this morning. Denies dental pain, but states, missing teeth sometimes makes it hard to eat.   This morning her blood glucose was 64. She denies lightheadedness, excessive sweating, or fatigue. No recent hypoglycemic events reported from facility nurse. Last blood glucose readings are as follows:  11/22- 223, 237, 249, 308  11/23- 168, 271, 215, 288  She received her covid booster vaccination 11/27/2019.   Facility nurse denies any concerns at this time, states vital signs have been stable.     Past Medical History:  Diagnosis Date  . Arthritis   . Depression   . Hypertension   . Pulmonary  embolism [facility])    Past Surgical History:  Procedure Laterality Date  . ABDOMINAL HYSTERECTOMY    . BACK SURGERY    . REPLACEMENT TOTAL KNEE BILATERAL      No Known Allergies  Allergies as of 12/01/2019   No Known Allergies     Medication List       Accurate as of December 01, 2019 10:01 AM. If you have any questions, ask your nurse or doctor.        acetaminophen 325  MG tablet Commonly known as: TYLENOL Take 650 mg by mouth every 6 (six) hours as needed for mild pain or fever.   acetaminophen 500 MG tablet Commonly known as: TYLENOL Take 1,000 mg by mouth 2 (two) times daily.   albuterol 108 (90 Base) MCG/ACT inhaler Commonly known as: VENTOLIN HFA Inhale 2 puffs into the lungs 3 (three) times daily.   aspirin EC 81 MG tablet Take 81 mg by mouth daily.   Claritin 10 MG tablet Generic drug: loratadine Take 10 mg by mouth daily.   DULoxetine 60 MG capsule Commonly known as: CYMBALTA Take 60 mg by mouth daily.   ferrous sulfate 325 (65 FE) MG tablet Take 325 mg by mouth daily with breakfast.   Flonase Allergy Relief 50 MCG/ACT nasal spray Generic drug: fluticasone 2 Sprays by Nasal route nightly to relieve seasonal allergy   furosemide 20 MG tablet Commonly known as: LASIX Take 20 mg by mouth daily.   gabapentin 600 MG tablet Commonly known as: NEURONTIN Take 600 mg by mouth 3 (three) times daily.   HumaLOG KwikPen 100 UNIT/ML KwikPen Generic drug: insulin lispro CBG with meals101-150=6 units ,151-200=8units,201-250=10units,251-300=14 units,301-350=18units 351-400=22units,greater than 400=26 units   insulin glargine 100 UNIT/ML injection Commonly known as: LANTUS Inject 38 Units into the skin daily.   lisinopril 2.5 MG tablet Commonly known as: ZESTRIL Take 2.5 mg by mouth daily.   metoprolol tartrate 25 MG tablet Commonly known as: LOPRESSOR Take 12.5 mg by mouth 2 (two) times daily. 0.5 tablet to = 12.5 mg   pantoprazole 40 MG tablet Commonly known as: PROTONIX Take 40 mg by mouth daily.   polyethylene glycol 17 g packet Commonly known as: MIRALAX / GLYCOLAX Take 17 g by mouth daily.   potassium chloride 10 MEQ tablet Commonly known as: KLOR-CON Take 10 mEq by mouth daily.   PREVIDENT 5000 BOOSTER DT Place 1 application onto teeth daily. Brush on teeth with toothbrush after PM mouth care, spit out excess, do not  rinse   promethazine 25 MG tablet Commonly known as: PHENERGAN Take 25 mg by mouth every 6 (six) hours as needed for nausea or vomiting.   Voltaren 1 % Gel Generic drug: diclofenac Sodium Apply 4 g topically 4 (four) times daily as needed. APPLY 4 G TOPICALLY TO HIPS BIL QID PRN FOR ARTHRITIS PAIN       Review of Systems  Constitutional: Negative for activity change, appetite change, fever and unexpected weight change.  HENT: Negative for hearing loss and trouble swallowing.        Missing teeth  Eyes: Negative for photophobia and visual disturbance.  Respiratory: Negative for cough and shortness of breath.        Oxygen use  Cardiovascular: Negative for chest pain and leg swelling.  Gastrointestinal: Negative for abdominal pain, constipation, diarrhea and nausea.  Genitourinary: Negative for dysuria and hematuria.       Incontinent  Musculoskeletal: Positive for arthralgias and myalgias.  Skin:  Dry skin  Neurological: Positive for weakness and numbness. Negative for tremors and headaches.  Psychiatric/Behavioral: Negative for decreased concentration and sleep disturbance. The patient is not nervous/anxious.     Immunization History  Administered Date(s) Administered  . Influenza, High Dose Seasonal PF 09/11/2016  . Influenza-Unspecified 09/07/2016, 09/25/2017, 10/10/2018, 10/28/2019  . Moderna SARS-COVID-2 Vaccination 01/07/2019, 02/04/2019, 11/25/2019  . Pneumococcal Polysaccharide-23 11/25/2017  . Pneumococcal-Unspecified 10/20/2019   Pertinent  Health Maintenance Due  Topic Date Due  . OPHTHALMOLOGY EXAM  Never done  . MAMMOGRAM  Never done  . COLONOSCOPY  Never done  . DEXA SCAN  Never done  . FOOT EXAM  03/15/2020  . HEMOGLOBIN A1C  05/01/2020  . PNA vac Low Risk Adult (2 of 2 - PCV13) 10/19/2020  . INFLUENZA VACCINE  Completed   Fall Risk  11/21/2017  Falls in the past year? 0  Number falls in past yr: 0  Injury with Fall? 0   Functional Status  Survey:    Vitals:   12/01/19 0954  BP: 118/78  Pulse: 83  Resp: 17  Temp: (!) 97.1 F (36.2 C)  SpO2: 93%  Weight: 219 lb (99.3 kg)  Height: 5' (1.524 m)   Body mass index is 42.77 kg/m. Physical Exam Vitals reviewed.  Constitutional:      General: She is not in acute distress.    Appearance: Normal appearance. She is normal weight.  HENT:     Head: Normocephalic.     Right Ear: There is impacted cerumen.     Left Ear: There is impacted cerumen.     Nose: Nose normal.     Mouth/Throat:     Mouth: Mucous membranes are moist.     Dentition: Abnormal dentition. No dental tenderness.     Pharynx: No posterior oropharyngeal erythema.  Eyes:     Extraocular Movements: Extraocular movements intact.     Pupils: Pupils are equal, round, and reactive to light.  Cardiovascular:     Rate and Rhythm: Normal rate and regular rhythm.     Pulses: Normal pulses.     Heart sounds: Normal heart sounds. No murmur heard.   Pulmonary:     Effort: Pulmonary effort is normal. No respiratory distress.     Breath sounds: Normal breath sounds. No wheezing.  Abdominal:     General: Bowel sounds are normal. There is no distension.     Palpations: Abdomen is soft.     Tenderness: There is no abdominal tenderness.  Musculoskeletal:     Right lower leg: No edema.     Left lower leg: No edema.  Feet:     Right foot:     Protective Sensation: 5 sites tested. 5 sites sensed.     Skin integrity: Skin integrity normal.     Toenail Condition: Right toenails are normal.     Left foot:     Protective Sensation: 5 sites tested. 5 sites sensed.     Skin integrity: Skin integrity normal.     Toenail Condition: Left toenails are normal.  Skin:    General: Skin is warm and dry.     Capillary Refill: Capillary refill takes less than 2 seconds.  Neurological:     General: No focal deficit present.     Mental Status: She is alert and oriented to person, place, and time. Mental status is at baseline.       Sensory: Sensory deficit present.     Motor: Weakness present.  Gait: Gait abnormal.  Psychiatric:        Mood and Affect: Mood normal.        Behavior: Behavior normal.        Thought Content: Thought content normal.        Judgment: Judgment normal.     Labs reviewed: Recent Labs    05/20/19 0000 06/24/19 0000 10/21/19 0000  NA 145 142 144  K 4.9 4.5 4.5  CL 102 103 102  CO2 27* 28* 32*  BUN 23* 25* 16  CREATININE 0.9 1.0 0.9  CALCIUM 9.7 9.7 9.8   Recent Labs    10/21/19 0000  AST 21  ALT 24  ALBUMIN 4.0   Recent Labs    12/07/18 0000 12/07/18 0000 03/01/19 0000 05/20/19 0000 10/21/19 0000  WBC 5.9   < > 6.4 6.4 5.7  NEUTROABS 3  --  4  --   --   HGB 11.2*   < > 10.7* 11.0* 11.3*  HCT 36   < > 35* 36 37  PLT 197   < > 184 195 192   < > = values in this interval not displayed.   Lab Results  Component Value Date   TSH 2.23 10/06/2018   TSH 2.23 10/06/2018   Lab Results  Component Value Date   HGBA1C 9.5 11/01/2019   Lab Results  Component Value Date   CHOL 151 10/21/2019   HDL 48 10/21/2019   LDLCALC 82 10/21/2019   TRIG 105 10/21/2019    Significant Diagnostic Results in last 30 days:  CT Head Wo Contrast  Result Date: 11/15/2019 CLINICAL DATA:  Fall.  Head trauma.  02/10/2018 EXAM: CT HEAD WITHOUT CONTRAST TECHNIQUE: Contiguous axial images were obtained from the base of the skull through the vertex without intravenous contrast. COMPARISON:  None. FINDINGS: Brain: There is no evidence for acute hemorrhage, hydrocephalus, mass lesion, or abnormal extra-axial fluid collection. No definite CT evidence for acute infarction. Diffuse loss of parenchymal volume is consistent with atrophy. Patchy low attenuation in the deep hemispheric and periventricular white matter is nonspecific, but likely reflects chronic microvascular ischemic demyelination. Stable prominence of the lateral ventricles. Vascular: No hyperdense vessel or unexpected  calcification. Skull: No evidence for fracture. No worrisome lytic or sclerotic lesion. Sinuses/Orbits: The visualized paranasal sinuses and mastoid air cells are clear. Visualized portions of the globes and intraorbital fat are unremarkable. Other: Frontal scalp contusion evident. IMPRESSION: 1. No acute intracranial abnormality. 2. Atrophy with chronic small vessel white matter ischemic disease. 3. Frontal scalp contusion. Electronically Signed   By: Kennith Center M.D.   On: 11/15/2019 14:24    Assessment/Plan 1. Type 2 diabetes mellitus with diabetic neuropathy, with long-term current use of insulin (HCC) -ongoing with CBG in 200-300's, last A1C 9.5 - AM CBG 64- do not think it is accurate, patient asymptomatic, no history of AM CBG ,60 - followed by endocrinology, will f/u with facility nurse to see when next appointment is - continue lispro sliding scale - continue lantus 38 units  - continue asa, ace and beta blocker  - continue gabapentin for neuropathy - she needs diabetic eye exam- will f/u with facility nurse - last hemoglobin A1C 10/25- recheck in 3 months  2. Essential hypertension - stable, bp at goal <150/90 - continue current medication regimen  3. Gastroesophageal reflux disease without esophagitis - stable with medication - continue current Protonix regimen  4. Stage 3 chronic kidney disease, unspecified whether stage 3a or 3b CKD (  HCC) - stable, continue to avoid nephrotoxic drugs and dose adjust for renal clearance  5. Polyneuropathy - stable, BLE 5/5 on diabetic foot exam - continue gabapentin for neuropathy  6. Chronic respiratory failure with hypoxia (HCC) - breathing unlabored on 2LNC - continue 2LNC  7. Cerumen in auditory canal on examination - Bilateral TM visible, but substantial cerumen buildup in canal - will order debrox 1-4 drops in each ear for 5 days    Family/ staff Communication: Plan discussed with patient and facility nurse  Labs/tests  ordered: none

## 2019-12-07 LAB — HM DIABETES FOOT EXAM

## 2019-12-09 LAB — HM DIABETES EYE EXAM

## 2019-12-10 LAB — TSH: TSH: 1.58 (ref <=5.90)

## 2020-01-26 ENCOUNTER — Non-Acute Institutional Stay (SKILLED_NURSING_FACILITY): Payer: Medicare Other | Admitting: Orthopedic Surgery

## 2020-01-26 ENCOUNTER — Encounter: Payer: Self-pay | Admitting: Orthopedic Surgery

## 2020-01-26 DIAGNOSIS — E1140 Type 2 diabetes mellitus with diabetic neuropathy, unspecified: Secondary | ICD-10-CM

## 2020-01-26 DIAGNOSIS — N183 Chronic kidney disease, stage 3 unspecified: Secondary | ICD-10-CM

## 2020-01-26 DIAGNOSIS — I10 Essential (primary) hypertension: Secondary | ICD-10-CM

## 2020-01-26 DIAGNOSIS — J9611 Chronic respiratory failure with hypoxia: Secondary | ICD-10-CM

## 2020-01-26 DIAGNOSIS — Z1231 Encounter for screening mammogram for malignant neoplasm of breast: Secondary | ICD-10-CM

## 2020-01-26 DIAGNOSIS — Z794 Long term (current) use of insulin: Secondary | ICD-10-CM

## 2020-01-26 DIAGNOSIS — K219 Gastro-esophageal reflux disease without esophagitis: Secondary | ICD-10-CM | POA: Diagnosis not present

## 2020-01-26 DIAGNOSIS — G629 Polyneuropathy, unspecified: Secondary | ICD-10-CM

## 2020-01-26 DIAGNOSIS — Z1159 Encounter for screening for other viral diseases: Secondary | ICD-10-CM

## 2020-01-26 NOTE — Progress Notes (Signed)
Location:    [facility] Room Number: 216/W Place of Service:  SNF [phone]) Provider: Hazle Nordmann NP   Kermit Balo, DO  Patient Care Team: Kermit Balo, DO as PCP - General (Geriatric Medicine)  Extended Emergency Contact Information Primary Emergency Contact: Laney Potash Address: [address]          [place], [place] [address] D[place] of [place] Home Phone: [phone] Relation: Son Preferred language: English Interpreter needed? No Secondary Emergency Contact: Marcille Buffy Home Phone: [phone] Relation: Sister  Code Status: DNR  Goals of care: Advanced Directive information Advanced Directives 01/26/2020  Does Patient Have a Medical Advance Directive? Yes  Type of Advance Directive Out of facility DNR (pink MOST or yellow form)  Does patient want to make changes to medical advance directive? No - Patient declined  Would patient like information on creating a medical advance directive? -  Pre-existing out of facility DNR order (yellow form or pink MOST form) Yellow form placed in chart (order not valid for inpatient use)     Chief Complaint  Patient presents with  . Medical Management of Chronic Issues    Routine Visit of Medical Management   . Quality Metric Gaps    Dexa Scan, Colonoscopy, Hep-C Screening,Mammogram  . Immunizations    T-Dap    HPI:  Pt is a 75 y.o. female seen today for medical management of chronic diseases.    She is a resident of [facility], seen at bedside today. PMH includes: pulmonary embolism, hypertension, chronic respiratory failure, GERD, type 2 diabetes, polyneuropathy, and depression.   Today, she is sitting in bed. Alert and oriented to self, person, and place, disoriented to time. Follows commands and can express needs. She is very pleasant and happy to have company. 12/2 she was seen by facility eye doctor. She was given artificial tears for dry eyes, TSH also  ordered. TSH normal. She claims the eye drops have helped and denies dry eyes. 01/14 ingrown toenail on her left great toe was removed by facility wound nurse. She tolerated procedure well. Receiving daily dressing changes of triple antibiotic ointment and mepliex. Her dentition is poor. She only has bottom front teeth left. States her sone is trying to help her get dentures. Denies mouth sores or pain.   No recent falls or injuries.   Recent blood pressures:  01/19- not listed  01/18- 130/70  01/17- 109/77  01/16- not listed  Blood sugars ranging 160-300s. No hypoglycemic events reported.   Recent weights are as follows:   01/19- 214.6 lbs  12/20- 224.23 lbs  11/15- 219 lbs        Past Medical History:  Diagnosis Date  . Arthritis   . Depression   . Hypertension   . Pulmonary embolism [facility])    Past Surgical History:  Procedure Laterality Date  . ABDOMINAL HYSTERECTOMY    . BACK SURGERY    . REPLACEMENT TOTAL KNEE BILATERAL      No Known Allergies  Allergies as of 01/26/2020   No Known Allergies     Medication List       Accurate as of January 26, 2020 10:38 AM. If you have any questions, ask your nurse or doctor.        acetaminophen 325 MG tablet Commonly known as: TYLENOL Take 650 mg by mouth every 6 (six) hours as needed for mild pain or fever.   acetaminophen 500 MG tablet  Commonly known as: TYLENOL Take 1,000 mg by mouth 2 (two) times daily.   albuterol 108 (90 Base) MCG/ACT inhaler Commonly known as: VENTOLIN HFA Inhale 2 puffs into the lungs 3 (three) times daily.   aspirin EC 81 MG tablet Take 81 mg by mouth daily.   [employer] ARTIFICIAL TEARS OP Apply 1 drop to eye 3 (three) times daily.   diclofenac Sodium 1 % Gel Commonly known as: VOLTAREN Apply 4 g topically 4 (four) times daily as needed. APPLY 4 G TOPICALLY TO HIPS BIL QID PRN FOR ARTHRITIS PAIN   DULoxetine 60 MG capsule Commonly known as: CYMBALTA Take 60 mg by mouth daily.    ferrous sulfate 325 (65 FE) MG tablet Take 325 mg by mouth daily with breakfast.   fluticasone 50 MCG/ACT nasal spray Commonly known as: FLONASE 2 Sprays by Nasal route nightly to relieve seasonal allergy   furosemide 20 MG tablet Commonly known as: LASIX Take 20 mg by mouth daily.   gabapentin 600 MG tablet Commonly known as: NEURONTIN Take 600 mg by mouth 3 (three) times daily.   insulin glargine 100 UNIT/ML injection Commonly known as: LANTUS Inject 38 Units into the skin daily.   insulin lispro 100 UNIT/ML KwikPen Commonly known as: HUMALOG CBG with meals101-150=6 units ,151-200=8units,201-250=10units,251-300=14 units,301-350=18units 351-400=22units,greater than 400=26 units   lisinopril 2.5 MG tablet Commonly known as: ZESTRIL Take 2.5 mg by mouth daily.   loratadine 10 MG tablet Commonly known as: CLARITIN Take 10 mg by mouth daily.   metoprolol tartrate 25 MG tablet Commonly known as: LOPRESSOR Take 12.5 mg by mouth 2 (two) times daily. 0.5 tablet to = 12.5 mg   pantoprazole 40 MG tablet Commonly known as: PROTONIX Take 40 mg by mouth daily.   polyethylene glycol 17 g packet Commonly known as: MIRALAX / GLYCOLAX Take 17 g by mouth daily.   potassium chloride 10 MEQ tablet Commonly known as: KLOR-CON Take 10 mEq by mouth daily.   PREVIDENT 5000 BOOSTER DT Place 1 application onto teeth daily. Brush on teeth with toothbrush after PM mouth care, spit out excess, do not rinse   promethazine 25 MG tablet Commonly known as: PHENERGAN Take 25 mg by mouth every 6 (six) hours as needed for nausea or vomiting.       Review of Systems  Constitutional: Positive for fever. Negative for activity change and appetite change.  HENT: Positive for dental problem. Negative for hearing loss and trouble swallowing.        Missing teeth  Eyes: Negative for photophobia and visual disturbance.  Respiratory: Negative for cough, shortness of breath and wheezing.         Oxygen use  Cardiovascular: Negative for chest pain and leg swelling.  Gastrointestinal: Negative for abdominal pain, diarrhea and nausea.  Endocrine: Negative for polydipsia, polyphagia and polyuria.  Genitourinary: Negative for dysuria, frequency and hematuria.  Musculoskeletal: Positive for arthralgias and myalgias.       Right shoulder pain  Skin:       Dry skin  Neurological: Positive for weakness and numbness. Negative for dizziness and light-headedness.  Psychiatric/Behavioral: Negative for confusion and dysphoric mood. The patient is not nervous/anxious.     Immunization History  Administered Date(s) Administered  . Influenza, High Dose Seasonal PF 09/11/2016  . Influenza-Unspecified 09/07/2016, 09/25/2017, 10/10/2018, 10/28/2019  . Moderna Sars-Covid-2 Vaccination 01/07/2019, 02/04/2019, 11/25/2019  . Pneumococcal Polysaccharide-23 11/25/2017  . Pneumococcal-Unspecified 10/20/2019   Pertinent  Health Maintenance Due  Topic Date Due  . COLONOSCOPY (  Pts 45-3167yrs Insurance coverage will need to be confirmed)  Never done  . MAMMOGRAM  Never done  . DEXA SCAN  Never done  . HEMOGLOBIN A1C  05/01/2020  . PNA vac Low Risk Adult (2 of 2 - PCV13) 10/19/2020  . FOOT EXAM  12/06/2020  . OPHTHALMOLOGY EXAM  12/08/2020  . INFLUENZA VACCINE  Completed   Fall Risk  11/21/2017  Falls in the past year? 0  Number falls in past yr: 0  Injury with Fall? 0   Functional Status Survey:    Vitals:   01/26/20 1037  BP: 130/70  Pulse: 75  Resp: 19  Temp: (!) 97 F (36.1 C)  SpO2: 97%  Weight: 224 lb 3.2 oz (101.7 kg)  Height: 5' (1.524 m)   Body mass index is 43.79 kg/m. Physical Exam Vitals reviewed.  Constitutional:      Appearance: She is obese.  HENT:     Head: Normocephalic.     Right Ear: There is no impacted cerumen.     Left Ear: There is no impacted cerumen.     Nose: Nose normal.     Mouth/Throat:     Mouth: Mucous membranes are moist.     Pharynx: No  posterior oropharyngeal erythema.     Comments: Poor dentition. No upper teeth present. No bottom molars present. No sores or lesions visible.  Eyes:     General:        Right eye: No discharge.        Left eye: No discharge.  Cardiovascular:     Rate and Rhythm: Normal rate and regular rhythm.     Pulses: Normal pulses.     Heart sounds: Normal heart sounds. No murmur heard.   Pulmonary:     Effort: Pulmonary effort is normal. No respiratory distress.     Breath sounds: Normal breath sounds. No wheezing.     Comments: 2 liters nasal cannula Abdominal:     General: Bowel sounds are normal. There is no distension.     Palpations: Abdomen is soft.     Tenderness: There is no abdominal tenderness.  Musculoskeletal:     Cervical back: Normal range of motion.     Right lower leg: No edema.     Left lower leg: No edema.     Comments: BLE foot drop  Lymphadenopathy:     Cervical: No cervical adenopathy.  Skin:    General: Skin is warm and dry.     Capillary Refill: Capillary refill takes less than 2 seconds.     Comments: Skin slightly swollen and red on left great toe. Bandage CDI, no drainage.   Neurological:     General: No focal deficit present.     Mental Status: She is alert. Mental status is at baseline.     Motor: Weakness present.     Gait: Gait abnormal.  Psychiatric:        Mood and Affect: Mood normal.        Behavior: Behavior normal.     Labs reviewed: Recent Labs    05/20/19 0000 06/24/19 0000 10/21/19 0000  NA 145 142 144  K 4.9 4.5 4.5  CL 102 103 102  CO2 27* 28* 32*  BUN 23* 25* 16  CREATININE 0.9 1.0 0.9  CALCIUM 9.7 9.7 9.8   Recent Labs    10/21/19 0000  AST 21  ALT 24  ALBUMIN 4.0   Recent Labs    03/01/19 0000 05/20/19 0000  10/21/19 0000  WBC 6.4 6.4 5.7  NEUTROABS 4  --   --   HGB 10.7* 11.0* 11.3*  HCT 35* 36 37  PLT 184 195 192   Lab Results  Component Value Date   TSH 1.58 12/10/2019   Lab Results  Component Value  Date   HGBA1C 9.5 11/01/2019   Lab Results  Component Value Date   CHOL 151 10/21/2019   HDL 48 10/21/2019   LDLCALC 82 10/21/2019   TRIG 105 10/21/2019    Significant Diagnostic Results in last 30 days:  No results found.  Assessment/Plan 1. Type 2 diabetes mellitus with diabetic neuropathy, with long-term current use of insulin (HCC) - ongoing, blood sugars remain high - followed by endocrinology, last visit unknown - continue lispro, asa, ace, beta blocker, and gabapentin - will increase lantus to 40 units QHS - hemoglobin A1C - bmp  2. Essential hypertension - bp at goal < 150/90 - continue metoprolol regimen - cbc/diff  3. Gastroesophageal reflux disease without esophagitis - stable, cont protonix  4. Stage 3 chronic kidney disease, unspecified whether stage 3a or 3b CKD (HCC) - continue to avoid nephrotoxic meds like NSAIDS and dose adjust medications to be renally excreted  5. Polyneuropathy - stable, continue gabapentin regimen  6. Chronic respiratory failure with hypoxia (HCC) - stable with 2 liters nasal cannula  7. Encounter for hepatitis C screening test for low risk patient - never done - hepatitis C screening  8. Screening mammogram for breast cancer - unknown last mammogram - referral for mammogram written    Family/ staff Communication:   Labs/tests ordered:

## 2020-01-27 LAB — COMPREHENSIVE METABOLIC PANEL
Calcium: 9.1 (ref 8.7–10.7)
GFR calc Af Amer: 63.9
GFR calc non Af Amer: 55.14

## 2020-01-27 LAB — BASIC METABOLIC PANEL
BUN: 24 — AB (ref 4–21)
CO2: 29 — AB (ref 13–22)
Chloride: 104 (ref 99–108)
Creatinine: 1 (ref 0.5–1.1)
Glucose: 128
Potassium: 4.7 (ref 3.4–5.3)
Sodium: 144 (ref 137–147)

## 2020-01-27 LAB — HEMOGLOBIN A1C: Hemoglobin A1C: 8.2

## 2020-01-27 LAB — CBC AND DIFFERENTIAL
HCT: 34 — AB (ref 36–46)
Hemoglobin: 11.1 — AB (ref 12.0–16.0)
Platelets: 191 (ref 150–399)
WBC: 5.6

## 2020-01-27 LAB — CBC: RBC: 4.2 (ref 3.87–5.11)

## 2020-01-30 LAB — HM HEPATITIS C SCREENING LAB: HM Hepatitis Screen: NEGATIVE

## 2020-02-28 ENCOUNTER — Encounter: Payer: Self-pay | Admitting: Internal Medicine

## 2020-03-08 ENCOUNTER — Non-Acute Institutional Stay (SKILLED_NURSING_FACILITY): Payer: Medicare Other | Admitting: Orthopedic Surgery

## 2020-03-08 ENCOUNTER — Encounter: Payer: Self-pay | Admitting: Orthopedic Surgery

## 2020-03-08 DIAGNOSIS — J9611 Chronic respiratory failure with hypoxia: Secondary | ICD-10-CM

## 2020-03-08 DIAGNOSIS — K219 Gastro-esophageal reflux disease without esophagitis: Secondary | ICD-10-CM

## 2020-03-08 DIAGNOSIS — N183 Chronic kidney disease, stage 3 unspecified: Secondary | ICD-10-CM | POA: Diagnosis not present

## 2020-03-08 DIAGNOSIS — I10 Essential (primary) hypertension: Secondary | ICD-10-CM

## 2020-03-08 DIAGNOSIS — E1140 Type 2 diabetes mellitus with diabetic neuropathy, unspecified: Secondary | ICD-10-CM

## 2020-03-08 DIAGNOSIS — G629 Polyneuropathy, unspecified: Secondary | ICD-10-CM

## 2020-03-08 DIAGNOSIS — Z794 Long term (current) use of insulin: Secondary | ICD-10-CM

## 2020-03-08 NOTE — Progress Notes (Signed)
Location:    [facility] Room Number: 216/W Place of Service:  SNF [phone]) Provider:  Hazle Nordmann NP  Kermit Balo, DO  Patient Care Team: Kermit Balo, DO as PCP - General (Geriatric Medicine)  Extended Emergency Contact Information Primary Emergency Contact: Laney Potash Address: [address]          [place], [place] [address] [place] of [place] Home Phone: [phone] Relation: Son Preferred language: English Interpreter needed? No Secondary Emergency Contact: Marcille Buffy Home Phone: [phone] Relation: Sister  Code Status:  DNR Goals of care: Advanced Directive information Advanced Directives 03/08/2020  Does Patient Have a Medical Advance Directive? Yes  Type of Advance Directive Out of facility DNR (pink MOST or yellow form)  Does patient want to make changes to medical advance directive? No - Patient declined  Would patient like information on creating a medical advance directive? -  Pre-existing out of facility DNR order (yellow form or pink MOST form) Yellow form placed in chart (order not valid for inpatient use)     Chief Complaint  Patient presents with  . Medical Management of Chronic Issues    Routine Visit of Medical Management   . Quality Metric Gaps    Discuss need for Dexa Scan, Mammogram, Colonoscopy  . Immunizations    Discuss need for T-Dap    HPI:  Pt is a 75 y.o. female seen today for medical management of chronic diseases.    She is a resident of [facility], seen at bedside today. PMH includes: pulmonary embolism, hypertension, chronic respiratory failure, GERD, type 2 diabetes, polyneuropathy, and depression.   Today, she is alert and oriented x 3. Disoriented to time. Follows commands and can express needs. Very pleasant. 02/22 she was seen by in-house podiatrist.Toenails trimmed. Advised to clean bilateral great toes and second toes with saline and apply triple  antibiotic daily for 5 days. No complications reported since visit. Remains on 2 liters oxygen. She denies chest pain or sob. Room is decorated for easter by her family. Reports daughter and son come to visit often. Nails are painted bright green. Expresses painting her nails one of her favorite pastimes.  Still plans to receive dentures in the future. She has not received any updates since last visit. Denies dental pain or mouth sores.   No recent falls or injuries.    Recent blood pressures are as follows:  03/01- 126/71, 136/64  02/27- 134/83  Recent weights are as follows:  02/21- 211.6 lbs  01/19- 214.6 lbs  12/20- 224.2 lbs  Blood sugars averaging 150-280. No hypoglycemic events.   Facility nurse does not report any concerns.     Past Medical History:  Diagnosis Date  . Arthritis   . Depression   . Hypertension   . Pulmonary embolism [facility])    Past Surgical History:  Procedure Laterality Date  . ABDOMINAL HYSTERECTOMY    . BACK SURGERY    . REPLACEMENT TOTAL KNEE BILATERAL      No Known Allergies  Allergies as of 03/08/2020   No Known Allergies     Medication List       Accurate as of March 08, 2020 11:31 AM. If you have any questions, ask your nurse or doctor.        STOP taking these medications   promethazine 25 MG tablet Commonly known as: PHENERGAN Stopped by: Octavia Heir, NP     TAKE these medications  acetaminophen 325 MG tablet Commonly known as: TYLENOL Take 650 mg by mouth every 6 (six) hours as needed for mild pain or fever.   acetaminophen 500 MG tablet Commonly known as: TYLENOL Take 1,000 mg by mouth 2 (two) times daily.   albuterol 108 (90 Base) MCG/ACT inhaler Commonly known as: VENTOLIN HFA Inhale 2 puffs into the lungs 3 (three) times daily.   aspirin EC 81 MG tablet Take 81 mg by mouth daily.   [employer] ARTIFICIAL TEARS OP Place 1 drop into both eyes 3 (three) times daily.   diclofenac Sodium 1 % Gel Commonly known as:  VOLTAREN Apply 4 g topically 4 (four) times daily as needed. APPLY 4 G TOPICALLY TO HIPS BIL QID PRN FOR ARTHRITIS PAIN   DULoxetine 60 MG capsule Commonly known as: CYMBALTA Take 60 mg by mouth daily.   ferrous sulfate 325 (65 FE) MG tablet Take 325 mg by mouth daily with breakfast.   fluticasone 50 MCG/ACT nasal spray Commonly known as: FLONASE 2 Sprays by Nasal route nightly to relieve seasonal allergy   furosemide 20 MG tablet Commonly known as: LASIX Take 20 mg by mouth daily.   gabapentin 600 MG tablet Commonly known as: NEURONTIN Take 600 mg by mouth 3 (three) times daily.   insulin glargine 100 UNIT/ML injection Commonly known as: LANTUS Inject 40 Units into the skin at bedtime.   insulin lispro 100 UNIT/ML KwikPen Commonly known as: HUMALOG CBG with meals101-150=6 units ,151-200=8units,201-250=10units,251-300=14 units,301-350=18units 351-400=22units,greater than 400=26 units   lisinopril 2.5 MG tablet Commonly known as: ZESTRIL Take 2.5 mg by mouth daily.   loratadine 10 MG tablet Commonly known as: CLARITIN Take 10 mg by mouth daily.   metFORMIN 500 MG tablet Commonly known as: GLUCOPHAGE Take 250 mg by mouth daily with supper.   metoprolol tartrate 25 MG tablet Commonly known as: LOPRESSOR Take 12.5 mg by mouth 2 (two) times daily. 0.5 tablet to = 12.5 mg   pantoprazole 40 MG tablet Commonly known as: PROTONIX Take 40 mg by mouth daily.   polyethylene glycol 17 g packet Commonly known as: MIRALAX / GLYCOLAX Take 17 g by mouth daily.   potassium chloride 10 MEQ tablet Commonly known as: KLOR-CON Take 10 mEq by mouth daily.   PREVIDENT 5000 BOOSTER DT Place 1 application onto teeth daily. Brush on teeth with toothbrush after PM mouth care, spit out excess, do not rinse       Review of Systems  Constitutional: Negative for activity change, appetite change and fever.  HENT: Positive for dental problem. Negative for congestion, hearing loss and  trouble swallowing.   Eyes: Negative for visual disturbance.  Respiratory: Negative for cough, shortness of breath and wheezing.        Oxygen use  Cardiovascular: Negative for chest pain and leg swelling.  Gastrointestinal: Negative for abdominal distention, abdominal pain, constipation, diarrhea and nausea.  Endocrine: Negative for polydipsia, polyphagia and polyuria.  Genitourinary: Negative for dysuria, frequency, hematuria and vaginal bleeding.  Musculoskeletal: Negative for arthralgias and myalgias.  Skin:       Dry skin  Neurological: Positive for weakness. Negative for dizziness and headaches.  Hematological: Bruises/bleeds easily.  Psychiatric/Behavioral: Negative for dysphoric mood. The patient is not nervous/anxious.     Immunization History  Administered Date(s) Administered  . Influenza, High Dose Seasonal PF 09/11/2016  . Influenza-Unspecified 09/07/2016, 09/25/2017, 10/10/2018, 10/28/2019  . Moderna Sars-Covid-2 Vaccination 01/07/2019, 02/04/2019, 11/25/2019  . Pneumococcal Polysaccharide-23 11/25/2017  . Pneumococcal-Unspecified 10/20/2019   Pertinent  Health Maintenance Due  Topic Date Due  . COLONOSCOPY (Pts 45-82yrs Insurance coverage will need to be confirmed)  Never done  . MAMMOGRAM  Never done  . DEXA SCAN  Never done  . HEMOGLOBIN A1C  07/26/2020  . PNA vac Low Risk Adult (2 of 2 - PCV13) 10/19/2020  . FOOT EXAM  12/06/2020  . OPHTHALMOLOGY EXAM  12/08/2020  . INFLUENZA VACCINE  Completed   Fall Risk  11/21/2017  Falls in the past year? 0  Number falls in past yr: 0  Injury with Fall? 0   Functional Status Survey:    Vitals:   03/08/20 1029  BP: 126/71  Pulse: 74  Resp: 19  Temp: (!) 97.5 F (36.4 C)  SpO2: 98%  Weight: 211 lb 9.6 oz (96 kg)  Height: 5' (1.524 m)   Body mass index is 41.33 kg/m. Physical Exam Vitals reviewed.  Constitutional:      General: She is in acute distress.     Appearance: She is obese.  HENT:     Head:  Normocephalic.     Right Ear: There is no impacted cerumen.     Left Ear: There is no impacted cerumen.     Nose: Nose normal.     Mouth/Throat:     Mouth: Mucous membranes are moist.     Comments: Missing teeth, no sores. Mucous membranes pink and moist.  Eyes:     General:        Right eye: No discharge.        Left eye: No discharge.  Cardiovascular:     Rate and Rhythm: Normal rate and regular rhythm.     Pulses: Normal pulses.     Heart sounds: Normal heart sounds. No murmur heard.   Pulmonary:     Effort: Pulmonary effort is normal. No respiratory distress.     Breath sounds: Normal breath sounds. No wheezing.  Abdominal:     General: Bowel sounds are normal. There is no distension.     Palpations: Abdomen is soft.     Tenderness: There is no abdominal tenderness.  Musculoskeletal:     Cervical back: Normal range of motion.     Right lower leg: No edema.     Left lower leg: No edema.     Comments: Bilateral foot drop  Lymphadenopathy:     Cervical: No cervical adenopathy.  Skin:    General: Skin is warm and dry.     Capillary Refill: Capillary refill takes less than 2 seconds.     Findings: No lesion.  Neurological:     General: No focal deficit present.     Mental Status: She is alert and oriented to person, place, and time.     Motor: Weakness present.     Gait: Gait abnormal.     Comments: Wheelchair/lift  Psychiatric:        Mood and Affect: Mood normal.        Behavior: Behavior normal.     Labs reviewed: Recent Labs    06/24/19 0000 10/21/19 0000 01/27/20 0000  NA 142 144 144  K 4.5 4.5 4.7  CL 103 102 104  CO2 28* 32* 29*  BUN 25* 16 24*  CREATININE 1.0 0.9 1.0  CALCIUM 9.7 9.8 9.1   Recent Labs    10/21/19 0000  AST 21  ALT 24  ALBUMIN 4.0   Recent Labs    05/20/19 0000 10/21/19 0000 01/27/20 0000  WBC 6.4 5.7 5.6  HGB 11.0* 11.3* 11.1*  HCT 36 37 34*  PLT 195 192 191   Lab Results  Component Value Date   TSH 1.58  12/10/2019   Lab Results  Component Value Date   HGBA1C 8.2 01/27/2020   Lab Results  Component Value Date   CHOL 151 10/21/2019   HDL 48 10/21/2019   LDLCALC 82 10/21/2019   TRIG 105 10/21/2019    Significant Diagnostic Results in last 30 days:  No results found.  Assessment/Plan 1. Type 2 diabetes mellitus with diabetic neuropathy, with long-term current use of insulin (HCC) - ongoing, blood glucose remains high - A1C 8.3 - followed by endocrinology, last visit unknown - cont asa, statin, ace, beta blocker and gabapentin - cont lantus 40 units QHS - will increase metformin to 500 mg po daily with supper  2. Essential hypertension - bp at goal, cont metoprolol - cont to limit sodium in diet  3. Gastroesophageal reflux disease without esophagitis - stable with protonix  4. Stage 3 chronic kidney disease, unspecified whether stage 3a or 3b CKD (HCC) - continue to avoid nephrotoxic meds like NSAIDS and dose adjust medications to be renally excreted  5. Polyneuropathy - stable with gabapentin  6. Chronic respiratory failure with hypoxia (HCC) - stable with 2 liters oxygen    Family/ staff Communication: Plans discussed with patient and facility nurse  Labs/tests ordered:  none

## 2021-09-15 IMAGING — CT CT HEAD W/O CM
4 series · 16 of 47 positions shown, 18 images · non-contrast
Comparison: None.

CLINICAL DATA: Fall.  Head trauma.  02/10/2018

EXAM:
CT HEAD WITHOUT CONTRAST
TECHNIQUE: Contiguous axial images were obtained from the base of the skull
through the vertex without intravenous contrast.

[Series 3: head wo · axial · 0.52mm/px · z∈[+1109,+1229]mm · 7 of 33 slices shown, 9 images]
[im 5/33  brain]
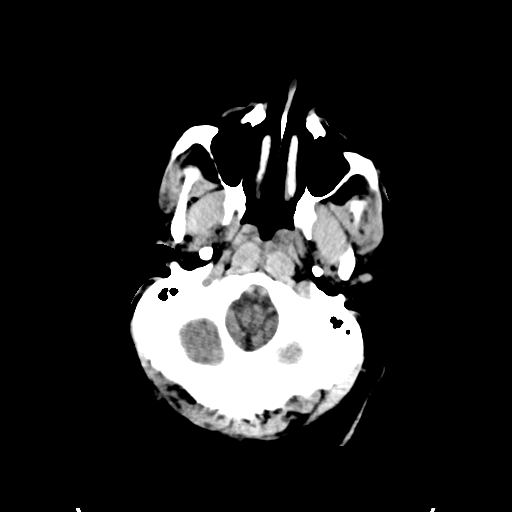
[im 5/33  bone]
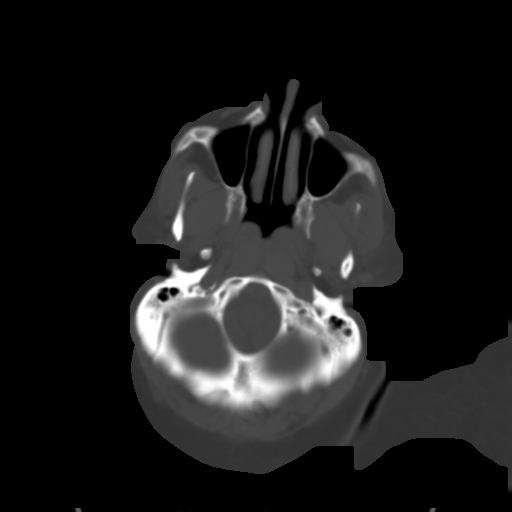
[im 9/33  brain]
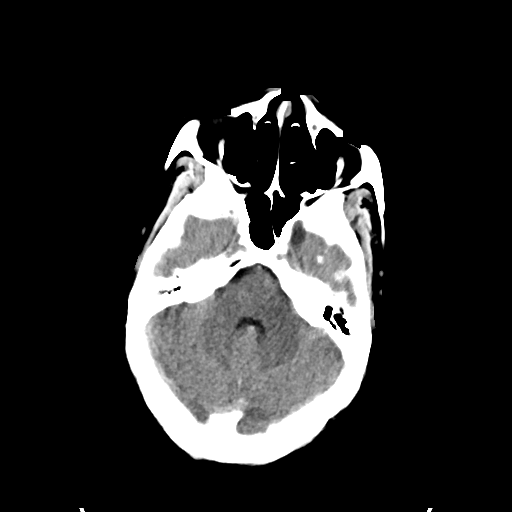
[im 13/33  brain]
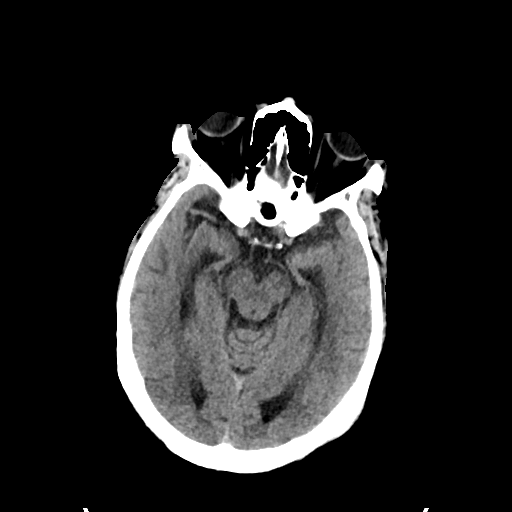
[im 17/33  brain]
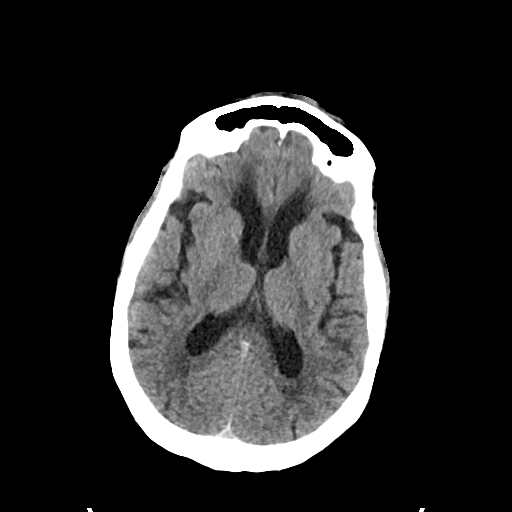
[im 21/33  brain]
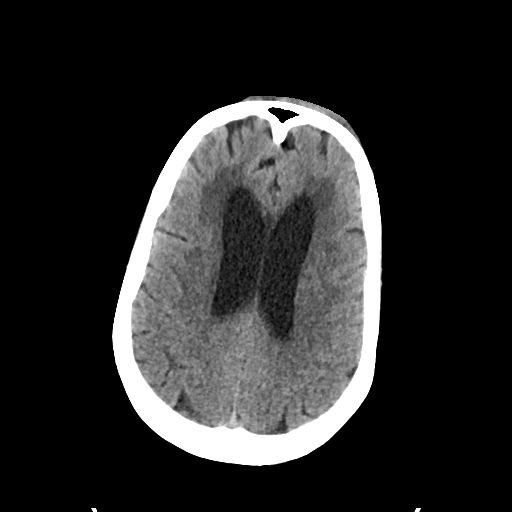
[im 21/33  bone]
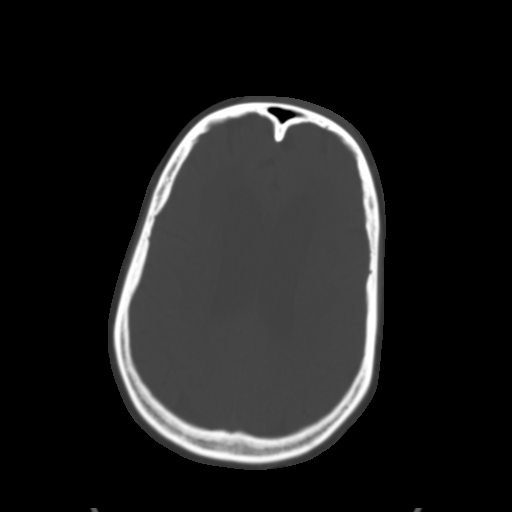
[im 25/33  brain]
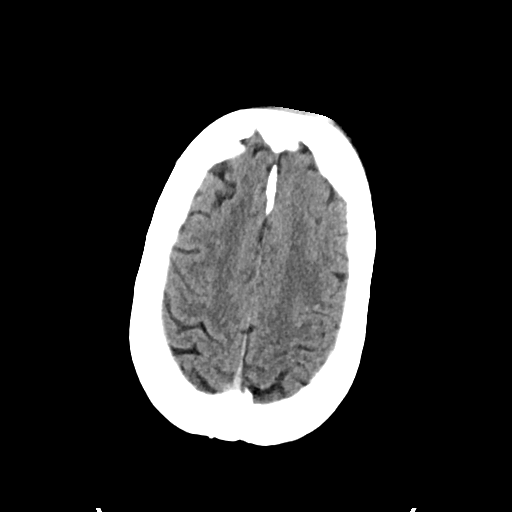
[im 29/33  brain]
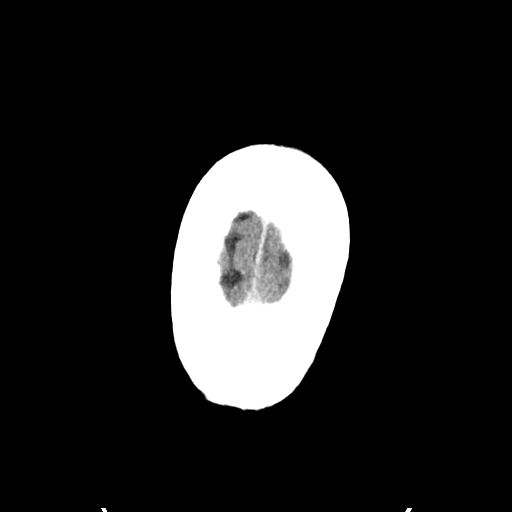

[Series 4: head bone · axial · 0.52mm/px · z∈[+1105,+1137]mm · 3 of 82 slices shown]
[im 9/82  bone]
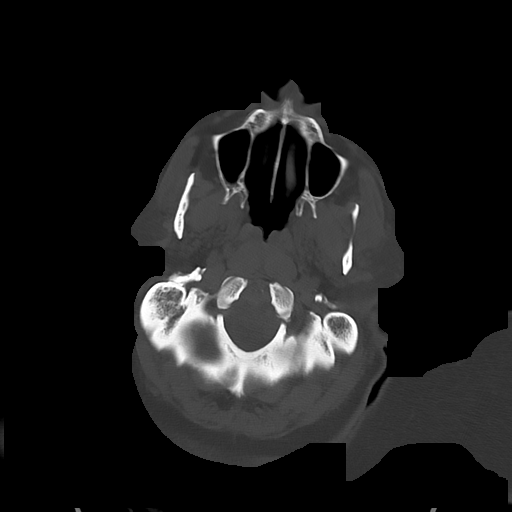
[im 17/82  bone]
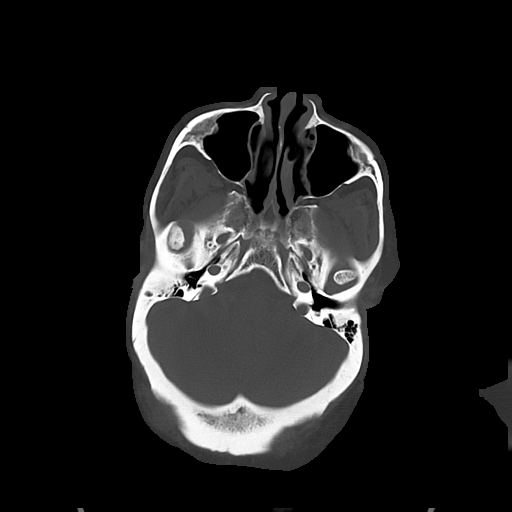
[im 25/82  bone]
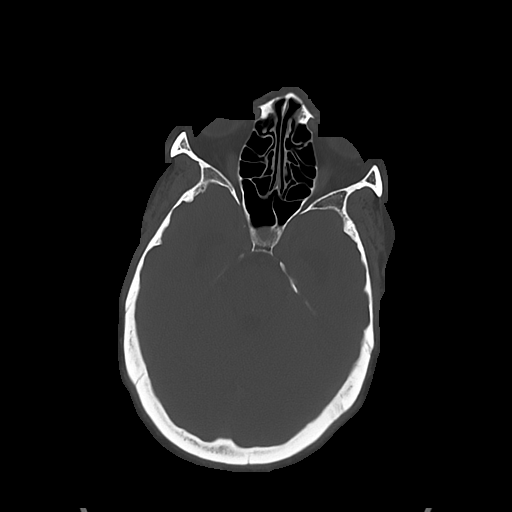

[Series 5: cor soft · coronal · 0.34mm/px · 3 of 75 slices shown]
[im 25/75  brain]
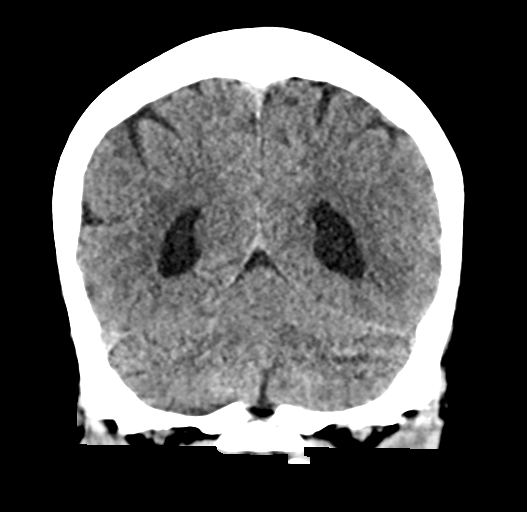
[im 33/75  brain]
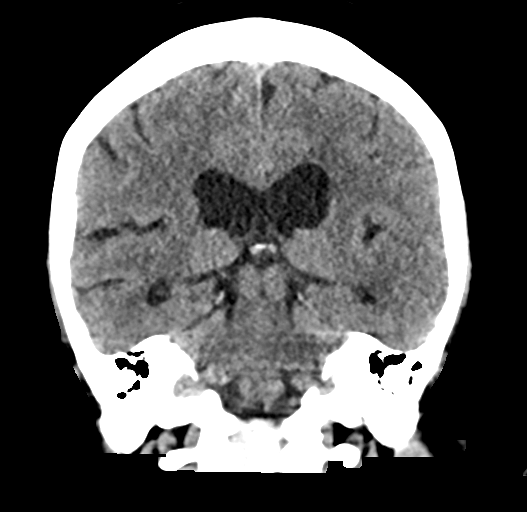
[im 42/75  brain]
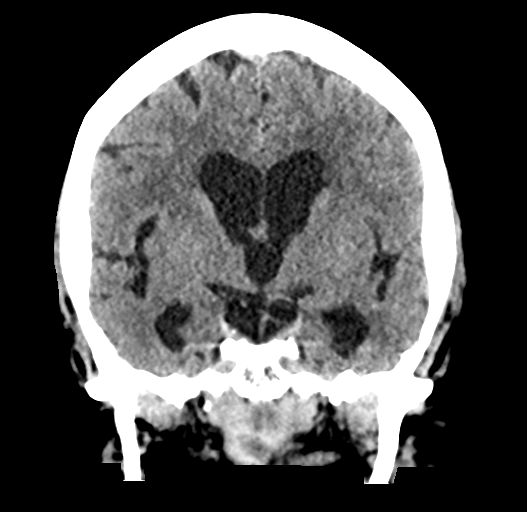

[Series 6: sag soft · sagittal · 0.34mm/px · 3 of 60 slices shown]
[im 20/60  brain]
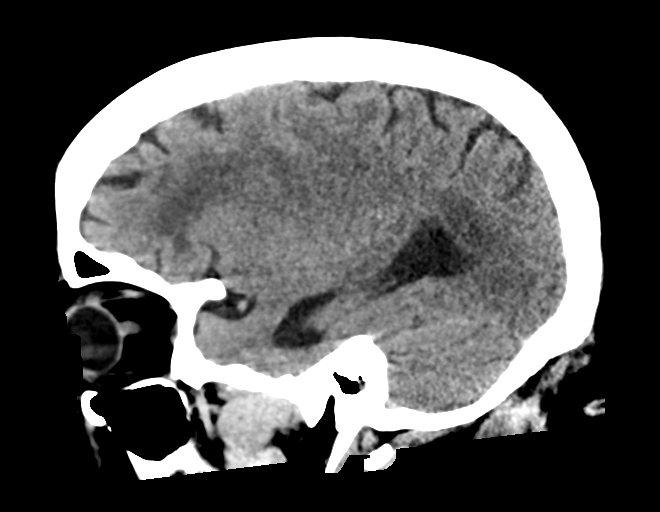
[im 30/60  brain]
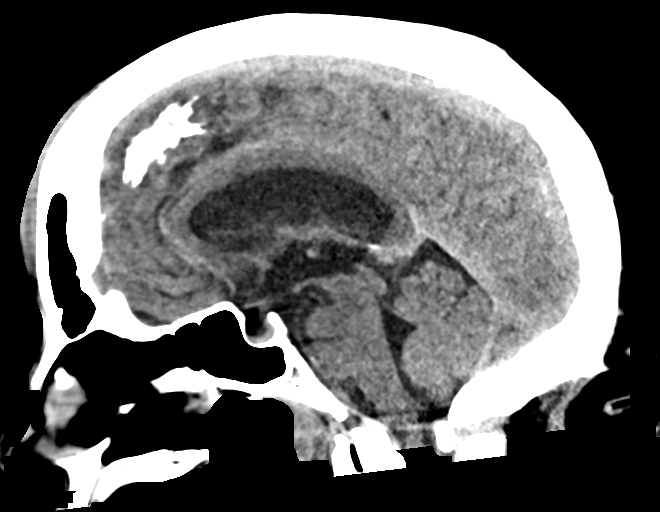
[im 40/60  brain]
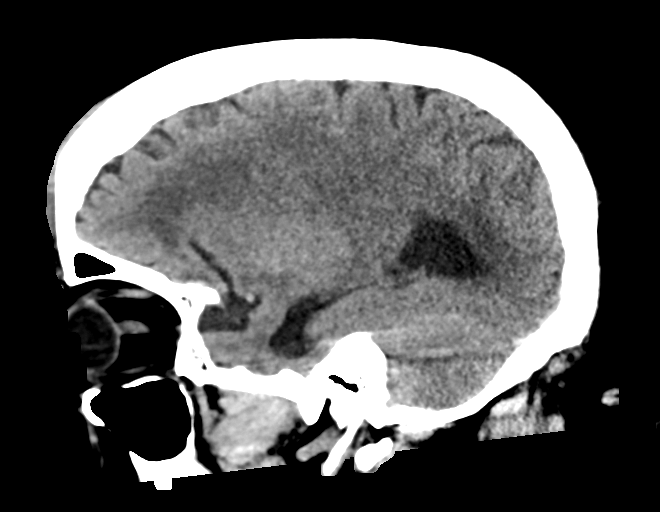

[16 of 47 positions shown; findings below may reference images not displayed]

FINDINGS: Brain: There is no evidence for acute hemorrhage, hydrocephalus,
mass lesion, or abnormal extra-axial fluid collection. No definite
CT evidence for acute infarction. Diffuse loss of parenchymal volume
is consistent with atrophy. Patchy low attenuation in the deep
hemispheric and periventricular white matter is nonspecific, but
likely reflects chronic microvascular ischemic demyelination. Stable
prominence of the lateral ventricles.

Vascular: No hyperdense vessel or unexpected calcification.

Skull: No evidence for fracture. No worrisome lytic or sclerotic
lesion.

Sinuses/Orbits: The visualized paranasal sinuses and mastoid air
cells are clear. Visualized portions of the globes and intraorbital
fat are unremarkable.

Other: Frontal scalp contusion evident.
IMPRESSION: 1. No acute intracranial abnormality.
2. Atrophy with chronic small vessel white matter ischemic disease.
3. Frontal scalp contusion.

## 2022-10-18 ENCOUNTER — Emergency Department (HOSPITAL_COMMUNITY): Payer: Medicare Other

## 2022-10-18 ENCOUNTER — Inpatient Hospital Stay (HOSPITAL_COMMUNITY): Payer: Medicare Other

## 2022-10-18 ENCOUNTER — Other Ambulatory Visit: Payer: Self-pay

## 2022-10-18 ENCOUNTER — Inpatient Hospital Stay (HOSPITAL_COMMUNITY)
Admission: EM | Admit: 2022-10-18 | Discharge: 2022-10-24 | DRG: 871 | Disposition: A | Discharge status: Skilled Nursing Facility | Payer: Medicare Other | Source: Skilled Nursing Facility | Attending: Internal Medicine | Admitting: Internal Medicine

## 2022-10-18 DIAGNOSIS — K219 Gastro-esophageal reflux disease without esophagitis: Secondary | ICD-10-CM | POA: Diagnosis present

## 2022-10-18 DIAGNOSIS — I214 Non-ST elevation (NSTEMI) myocardial infarction: Secondary | ICD-10-CM | POA: Diagnosis not present

## 2022-10-18 DIAGNOSIS — J449 Chronic obstructive pulmonary disease, unspecified: Secondary | ICD-10-CM | POA: Diagnosis present

## 2022-10-18 DIAGNOSIS — F0393 Unspecified dementia, unspecified severity, with mood disturbance: Secondary | ICD-10-CM | POA: Diagnosis present

## 2022-10-18 DIAGNOSIS — Z9981 Dependence on supplemental oxygen: Secondary | ICD-10-CM

## 2022-10-18 DIAGNOSIS — F03918 Unspecified dementia, unspecified severity, with other behavioral disturbance: Secondary | ICD-10-CM | POA: Diagnosis present

## 2022-10-18 DIAGNOSIS — Z833 Family history of diabetes mellitus: Secondary | ICD-10-CM

## 2022-10-18 DIAGNOSIS — R7989 Other specified abnormal findings of blood chemistry: Secondary | ICD-10-CM

## 2022-10-18 DIAGNOSIS — F32A Depression, unspecified: Secondary | ICD-10-CM | POA: Diagnosis present

## 2022-10-18 DIAGNOSIS — A419 Sepsis, unspecified organism: Principal | ICD-10-CM | POA: Diagnosis present

## 2022-10-18 DIAGNOSIS — E66812 Obesity, class 2: Secondary | ICD-10-CM | POA: Diagnosis present

## 2022-10-18 DIAGNOSIS — N182 Chronic kidney disease, stage 2 (mild): Secondary | ICD-10-CM | POA: Diagnosis present

## 2022-10-18 DIAGNOSIS — E669 Obesity, unspecified: Secondary | ICD-10-CM | POA: Diagnosis present

## 2022-10-18 DIAGNOSIS — E872 Acidosis, unspecified: Secondary | ICD-10-CM | POA: Diagnosis present

## 2022-10-18 DIAGNOSIS — I130 Hypertensive heart and chronic kidney disease with heart failure and stage 1 through stage 4 chronic kidney disease, or unspecified chronic kidney disease: Secondary | ICD-10-CM | POA: Diagnosis present

## 2022-10-18 DIAGNOSIS — Z7189 Other specified counseling: Secondary | ICD-10-CM

## 2022-10-18 DIAGNOSIS — D509 Iron deficiency anemia, unspecified: Secondary | ICD-10-CM | POA: Diagnosis present

## 2022-10-18 DIAGNOSIS — J9621 Acute and chronic respiratory failure with hypoxia: Secondary | ICD-10-CM

## 2022-10-18 DIAGNOSIS — R571 Hypovolemic shock: Secondary | ICD-10-CM | POA: Diagnosis present

## 2022-10-18 DIAGNOSIS — I5032 Chronic diastolic (congestive) heart failure: Secondary | ICD-10-CM | POA: Diagnosis present

## 2022-10-18 DIAGNOSIS — G4710 Hypersomnia, unspecified: Secondary | ICD-10-CM | POA: Diagnosis present

## 2022-10-18 DIAGNOSIS — N179 Acute kidney failure, unspecified: Secondary | ICD-10-CM | POA: Diagnosis present

## 2022-10-18 DIAGNOSIS — Z515 Encounter for palliative care: Secondary | ICD-10-CM

## 2022-10-18 DIAGNOSIS — Z90710 Acquired absence of both cervix and uterus: Secondary | ICD-10-CM

## 2022-10-18 DIAGNOSIS — Z7982 Long term (current) use of aspirin: Secondary | ICD-10-CM

## 2022-10-18 DIAGNOSIS — R6521 Severe sepsis with septic shock: Secondary | ICD-10-CM | POA: Diagnosis present

## 2022-10-18 DIAGNOSIS — I21A1 Myocardial infarction type 2: Secondary | ICD-10-CM | POA: Diagnosis present

## 2022-10-18 DIAGNOSIS — Z7984 Long term (current) use of oral hypoglycemic drugs: Secondary | ICD-10-CM

## 2022-10-18 DIAGNOSIS — N171 Acute kidney failure with acute cortical necrosis: Secondary | ICD-10-CM | POA: Diagnosis not present

## 2022-10-18 DIAGNOSIS — J9611 Chronic respiratory failure with hypoxia: Secondary | ICD-10-CM | POA: Diagnosis present

## 2022-10-18 DIAGNOSIS — E1122 Type 2 diabetes mellitus with diabetic chronic kidney disease: Secondary | ICD-10-CM | POA: Diagnosis present

## 2022-10-18 DIAGNOSIS — F0390 Unspecified dementia without behavioral disturbance: Secondary | ICD-10-CM | POA: Diagnosis present

## 2022-10-18 DIAGNOSIS — Z1152 Encounter for screening for COVID-19: Secondary | ICD-10-CM | POA: Diagnosis not present

## 2022-10-18 DIAGNOSIS — R627 Adult failure to thrive: Secondary | ICD-10-CM | POA: Diagnosis present

## 2022-10-18 DIAGNOSIS — L89152 Pressure ulcer of sacral region, stage 2: Secondary | ICD-10-CM | POA: Diagnosis present

## 2022-10-18 DIAGNOSIS — E119 Type 2 diabetes mellitus without complications: Secondary | ICD-10-CM | POA: Diagnosis not present

## 2022-10-18 DIAGNOSIS — E875 Hyperkalemia: Secondary | ICD-10-CM

## 2022-10-18 DIAGNOSIS — Z960 Presence of urogenital implants: Secondary | ICD-10-CM | POA: Diagnosis present

## 2022-10-18 DIAGNOSIS — Z66 Do not resuscitate: Secondary | ICD-10-CM | POA: Diagnosis present

## 2022-10-18 DIAGNOSIS — E11649 Type 2 diabetes mellitus with hypoglycemia without coma: Secondary | ICD-10-CM | POA: Diagnosis present

## 2022-10-18 DIAGNOSIS — Z96653 Presence of artificial knee joint, bilateral: Secondary | ICD-10-CM | POA: Diagnosis present

## 2022-10-18 DIAGNOSIS — R579 Shock, unspecified: Secondary | ICD-10-CM | POA: Diagnosis not present

## 2022-10-18 DIAGNOSIS — I2729 Other secondary pulmonary hypertension: Secondary | ICD-10-CM | POA: Diagnosis present

## 2022-10-18 DIAGNOSIS — Z794 Long term (current) use of insulin: Secondary | ICD-10-CM

## 2022-10-18 DIAGNOSIS — N390 Urinary tract infection, site not specified: Secondary | ICD-10-CM | POA: Diagnosis present

## 2022-10-18 DIAGNOSIS — Z86711 Personal history of pulmonary embolism: Secondary | ICD-10-CM

## 2022-10-18 DIAGNOSIS — E785 Hyperlipidemia, unspecified: Secondary | ICD-10-CM | POA: Diagnosis present

## 2022-10-18 DIAGNOSIS — G4733 Obstructive sleep apnea (adult) (pediatric): Secondary | ICD-10-CM | POA: Diagnosis present

## 2022-10-18 DIAGNOSIS — Z6836 Body mass index (BMI) 36.0-36.9, adult: Secondary | ICD-10-CM

## 2022-10-18 DIAGNOSIS — Z7901 Long term (current) use of anticoagulants: Secondary | ICD-10-CM

## 2022-10-18 DIAGNOSIS — Z8249 Family history of ischemic heart disease and other diseases of the circulatory system: Secondary | ICD-10-CM

## 2022-10-18 DIAGNOSIS — Z87891 Personal history of nicotine dependence: Secondary | ICD-10-CM

## 2022-10-18 DIAGNOSIS — Z7401 Bed confinement status: Secondary | ICD-10-CM

## 2022-10-18 DIAGNOSIS — R1310 Dysphagia, unspecified: Secondary | ICD-10-CM | POA: Diagnosis present

## 2022-10-18 HISTORY — DX: Chronic respiratory failure with hypoxia: J96.11

## 2022-10-18 HISTORY — DX: Chronic kidney disease, stage 3 unspecified: N18.30

## 2022-10-18 HISTORY — DX: Unspecified diastolic (congestive) heart failure: I50.30

## 2022-10-18 HISTORY — DX: Type 2 diabetes mellitus without complications: E11.9

## 2022-10-18 HISTORY — DX: Hyperlipidemia, unspecified: E78.5

## 2022-10-18 HISTORY — DX: Dependence on supplemental oxygen: Z99.81

## 2022-10-18 LAB — COMPREHENSIVE METABOLIC PANEL
ALT: 22 U/L (ref 0–44)
AST: 33 U/L (ref 15–41)
Albumin: 3.4 g/dL — ABNORMAL LOW (ref 3.5–5.0)
Alkaline Phosphatase: 63 U/L (ref 38–126)
Anion gap: 27 — ABNORMAL HIGH (ref 5–15)
BUN: 73 mg/dL — ABNORMAL HIGH (ref 8–23)
CO2: 20 mmol/L — ABNORMAL LOW (ref 22–32)
Calcium: 9.2 mg/dL (ref 8.9–10.3)
Chloride: 94 mmol/L — ABNORMAL LOW (ref 98–111)
Creatinine, Ser: 4.87 mg/dL — ABNORMAL HIGH (ref 0.44–1.00)
GFR, Estimated: 9 mL/min — ABNORMAL LOW (ref >60)
Glucose, Bld: 63 mg/dL — ABNORMAL LOW (ref 70–99)
Potassium: 6.2 mmol/L — ABNORMAL HIGH (ref 3.5–5.1)
Sodium: 141 mmol/L (ref 135–145)
Total Bilirubin: 0.7 mg/dL (ref 0.3–1.2)
Total Protein: 7.1 g/dL (ref 6.5–8.1)

## 2022-10-18 LAB — CBC WITH DIFFERENTIAL/PLATELET
Abs Immature Granulocytes: 0.04 10*3/uL (ref 0.00–0.07)
Basophils Absolute: 0 10*3/uL (ref 0.0–0.1)
Basophils Relative: 0 %
Eosinophils Absolute: 0 10*3/uL (ref 0.0–0.5)
Eosinophils Relative: 0 %
HCT: 39.5 % (ref 36.0–46.0)
Hemoglobin: 11 g/dL — ABNORMAL LOW (ref 12.0–15.0)
Immature Granulocytes: 0 %
Lymphocytes Relative: 11 %
Lymphs Abs: 1.3 10*3/uL (ref 0.7–4.0)
MCH: 25.3 pg — ABNORMAL LOW (ref 26.0–34.0)
MCHC: 27.8 g/dL — ABNORMAL LOW (ref 30.0–36.0)
MCV: 91 fL (ref 80.0–100.0)
Monocytes Absolute: 0.7 10*3/uL (ref 0.1–1.0)
Monocytes Relative: 6 %
Neutro Abs: 9.5 10*3/uL — ABNORMAL HIGH (ref 1.7–7.7)
Neutrophils Relative %: 83 %
Platelets: 298 10*3/uL (ref 150–400)
RBC: 4.34 MIL/uL (ref 3.87–5.11)
RDW: 20.2 % — ABNORMAL HIGH (ref 11.5–15.5)
WBC: 11.5 10*3/uL — ABNORMAL HIGH (ref 4.0–10.5)
nRBC: 0 % (ref 0.0–0.2)

## 2022-10-18 LAB — TROPONIN I (HIGH SENSITIVITY)
Troponin I (High Sensitivity): 261 ng/L (ref <18)
Troponin I (High Sensitivity): 198 ng/L (ref <18)

## 2022-10-18 LAB — URINALYSIS, W/ REFLEX TO CULTURE (INFECTION SUSPECTED)
Bilirubin Urine: NEGATIVE
Glucose, UA: NEGATIVE mg/dL
Ketones, ur: NEGATIVE mg/dL
Nitrite: NEGATIVE
Protein, ur: 30 mg/dL — AB
RBC / HPF: >50 RBC/hpf (ref 0–5)
Specific Gravity, Urine: 1.012 (ref 1.005–1.030)
WBC, UA: >50 WBC/hpf (ref 0–5)
pH: 5 (ref 5.0–8.0)

## 2022-10-18 LAB — I-STAT CHEM 8, ED
BUN: 75 mg/dL — ABNORMAL HIGH (ref 8–23)
Calcium, Ion: 1.03 mmol/L — ABNORMAL LOW (ref 1.15–1.40)
Chloride: 101 mmol/L (ref 98–111)
Creatinine, Ser: 5.4 mg/dL — ABNORMAL HIGH (ref 0.44–1.00)
Glucose, Bld: 55 mg/dL — ABNORMAL LOW (ref 70–99)
HCT: 39 % (ref 36.0–46.0)
Hemoglobin: 13.3 g/dL (ref 12.0–15.0)
Potassium: 6.2 mmol/L — ABNORMAL HIGH (ref 3.5–5.1)
Sodium: 138 mmol/L (ref 135–145)
TCO2: 22 mmol/L (ref 22–32)

## 2022-10-18 LAB — BLOOD GAS, VENOUS
Acid-base deficit: 6.3 mmol/L — ABNORMAL HIGH (ref 0.0–2.0)
Bicarbonate: 21.4 mmol/L (ref 20.0–28.0)
O2 Saturation: 32.3 %
Patient temperature: 37
pCO2, Ven: 50 mm[Hg] (ref 44–60)
pH, Ven: 7.24 — ABNORMAL LOW (ref 7.25–7.43)
pO2, Ven: <31 mm[Hg] — CL (ref 32–45)

## 2022-10-18 LAB — PROTIME-INR
INR: 1.7 — ABNORMAL HIGH (ref 0.8–1.2)
Prothrombin Time: 20 s — ABNORMAL HIGH (ref 11.4–15.2)

## 2022-10-18 LAB — I-STAT CG4 LACTIC ACID, ED
Lactic Acid, Venous: 14.3 mmol/L (ref 0.5–1.9)
Lactic Acid, Venous: 11.6 mmol/L (ref 0.5–1.9)

## 2022-10-18 LAB — SARS CORONAVIRUS 2 BY RT PCR: SARS Coronavirus 2 by RT PCR: NEGATIVE

## 2022-10-18 LAB — APTT: aPTT: 32 s (ref 24–36)

## 2022-10-18 LAB — CBG MONITORING, ED: Glucose-Capillary: 120 mg/dL — ABNORMAL HIGH (ref 70–99)

## 2022-10-18 LAB — PHOSPHORUS: Phosphorus: 7.9 mg/dL — ABNORMAL HIGH (ref 2.5–4.6)

## 2022-10-18 LAB — MAGNESIUM: Magnesium: 3 mg/dL — ABNORMAL HIGH (ref 1.7–2.4)

## 2022-10-18 MED ORDER — SODIUM CHLORIDE 0.9 % IV SOLN
250.0000 mL | INTRAVENOUS | Status: DC
  Administered 2022-10-18: 250 mL via INTRAVENOUS

## 2022-10-18 MED ORDER — METHYLPREDNISOLONE SODIUM SUCC 125 MG IJ SOLR
125.0000 mg | Freq: Once | INTRAMUSCULAR | Status: AC
  Administered 2022-10-18: 125 mg via INTRAVENOUS
  Filled 2022-10-18: qty 2

## 2022-10-18 MED ORDER — FUROSEMIDE 10 MG/ML IJ SOLN
20.0000 mg | Freq: Once | INTRAMUSCULAR | Status: AC
  Administered 2022-10-18: 20 mg via INTRAVENOUS
  Filled 2022-10-18: qty 4

## 2022-10-18 MED ORDER — INSULIN ASPART 100 UNIT/ML IJ SOLN
1.0000 [IU] | INTRAMUSCULAR | Status: DC
  Administered 2022-10-19 (×2): 3 [IU] via SUBCUTANEOUS
  Filled 2022-10-18: qty 0.03

## 2022-10-18 MED ORDER — NOREPINEPHRINE 4 MG/250ML-% IV SOLN
0.0000 ug/min | INTRAVENOUS | Status: DC
  Administered 2022-10-18: 2 ug/min via INTRAVENOUS
  Filled 2022-10-18: qty 250

## 2022-10-18 MED ORDER — FAMOTIDINE 20 MG PO TABS
10.0000 mg | ORAL_TABLET | Freq: Every day | ORAL | Status: DC
  Administered 2022-10-20 – 2022-10-21 (×2): 10 mg via ORAL
  Filled 2022-10-18 (×3): qty 1

## 2022-10-18 MED ORDER — APIXABAN 5 MG PO TABS
5.0000 mg | ORAL_TABLET | Freq: Two times a day (BID) | ORAL | Status: DC
  Administered 2022-10-18 – 2022-10-21 (×5): 5 mg via ORAL
  Filled 2022-10-18 (×6): qty 1

## 2022-10-18 MED ORDER — POLYETHYLENE GLYCOL 3350 17 G PO PACK: 17.0000 g | PACK | Freq: Every day | ORAL | Status: DC | PRN

## 2022-10-18 MED ORDER — APIXABAN 5 MG PO TABS
5.0000 mg | ORAL_TABLET | Freq: Two times a day (BID) | ORAL | Status: DC
Start: 2022-10-18 — End: 2022-10-18

## 2022-10-18 MED ORDER — ASPIRIN 300 MG RE SUPP: 300.0000 mg | RECTAL | Status: AC

## 2022-10-18 MED ORDER — APIXABAN 5 MG PO TABS
5.0000 mg | ORAL_TABLET | Freq: Two times a day (BID) | ORAL | Status: DC
Start: 2022-10-25 — End: 2022-10-18

## 2022-10-18 MED ORDER — METRONIDAZOLE 500 MG/100ML IV SOLN
500.0000 mg | Freq: Once | INTRAVENOUS | Status: AC
  Administered 2022-10-18: 500 mg via INTRAVENOUS
  Filled 2022-10-18: qty 100

## 2022-10-18 MED ORDER — VANCOMYCIN HCL 2000 MG/400ML IV SOLN
2000.0000 mg | Freq: Once | INTRAVENOUS | Status: AC
  Administered 2022-10-18: 2000 mg via INTRAVENOUS
  Filled 2022-10-18: qty 400

## 2022-10-18 MED ORDER — NOREPINEPHRINE 4 MG/250ML-% IV SOLN
2.0000 ug/min | INTRAVENOUS | Status: DC
  Administered 2022-10-18: 2 ug/min via INTRAVENOUS
  Administered 2022-10-19: 11 ug/min via INTRAVENOUS
  Administered 2022-10-19: 5 ug/min via INTRAVENOUS
  Administered 2022-10-19: 10 ug/min via INTRAVENOUS
  Administered 2022-10-20: 3 ug/min via INTRAVENOUS
  Filled 2022-10-18 (×4): qty 250

## 2022-10-18 MED ORDER — CALCIUM GLUCONATE-NACL 1-0.675 GM/50ML-% IV SOLN
1.0000 g | Freq: Once | INTRAVENOUS | Status: AC
  Administered 2022-10-18: 1000 mg via INTRAVENOUS
  Filled 2022-10-18: qty 50

## 2022-10-18 MED ORDER — SODIUM CHLORIDE 0.9 % IV SOLN
250.0000 mL | INTRAVENOUS | Status: AC
  Administered 2022-10-18: 250 mL via INTRAVENOUS

## 2022-10-18 MED ORDER — DOCUSATE SODIUM 100 MG PO CAPS: 100.0000 mg | ORAL_CAPSULE | Freq: Two times a day (BID) | ORAL | Status: DC | PRN

## 2022-10-18 MED ORDER — VANCOMYCIN HCL IN DEXTROSE 1-5 GM/200ML-% IV SOLN: 1000.0000 mg | Freq: Once | INTRAVENOUS | Status: DC

## 2022-10-18 MED ORDER — SODIUM CHLORIDE 0.9 % IV SOLN
2.0000 g | Freq: Once | INTRAVENOUS | Status: AC
  Administered 2022-10-18: 2 g via INTRAVENOUS
  Filled 2022-10-18: qty 12.5

## 2022-10-18 MED ORDER — DEXTROSE 10 % IV BOLUS
250.0000 mL | Freq: Once | INTRAVENOUS | Status: AC
  Administered 2022-10-18: 250 mL via INTRAVENOUS

## 2022-10-18 MED ORDER — SODIUM CHLORIDE 0.9 % IV BOLUS: 1000.0000 mL | Freq: Once | INTRAVENOUS | Status: DC

## 2022-10-18 MED ORDER — ASPIRIN 81 MG PO CHEW
324.0000 mg | CHEWABLE_TABLET | ORAL | Status: AC
  Administered 2022-10-18: 324 mg via ORAL
  Filled 2022-10-18: qty 4

## 2022-10-18 MED ORDER — IPRATROPIUM-ALBUTEROL 0.5-2.5 (3) MG/3ML IN SOLN
3.0000 mL | Freq: Once | RESPIRATORY_TRACT | Status: AC
  Administered 2022-10-18: 3 mL via RESPIRATORY_TRACT
  Filled 2022-10-18: qty 3

## 2022-10-18 MED ORDER — SODIUM CHLORIDE 0.9 % IV BOLUS
250.0000 mL | Freq: Once | INTRAVENOUS | Status: AC
  Administered 2022-10-18: 250 mL via INTRAVENOUS

## 2022-10-18 MED ORDER — SODIUM CHLORIDE 0.9 % IV SOLN
1.0000 g | INTRAVENOUS | Status: DC
  Administered 2022-10-19 – 2022-10-20 (×2): 1 g via INTRAVENOUS
  Filled 2022-10-18 (×3): qty 10

## 2022-10-18 MED ORDER — IPRATROPIUM-ALBUTEROL 0.5-2.5 (3) MG/3ML IN SOLN
3.0000 mL | RESPIRATORY_TRACT | Status: AC
  Administered 2022-10-18 (×2): 3 mL via RESPIRATORY_TRACT
  Filled 2022-10-18: qty 6

## 2022-10-18 NOTE — ED Provider Notes (Signed)
[place] EMERGENCY DEPARTMENT AT [facility] Provider Note   CSN: [id number] Arrival date & time: 10/18/22  1803     History {Add pertinent medical, surgical, social history, OB history to HPI:1} Chief Complaint  Patient presents with   Abnormal Labs    Kristine Todd is a 77 y.o. female with PMH as listed below who presents with ***.   Patient brought in by EMS from [facility] with c/o abnormal labs. Per EMS there is something going around in facility. Patient GFR was decreased and was hyperkalemic. She denies N/V/D. EMS started 22g in L hand and 20g in L forearm, giving 800cc of NS.  80/40 CBG: 99 97.4  Low GFR and high potassium were reported by the facility.   Admit date: 09/26/2022  Discharge date: 10/02/2022   Discharge Service: [facility] Hospitalist  Discharge Attending Physician:Milton Ovid Curd, MD  Discharge to: Skilled Nursing Facility  Discharge Diagnoses (all POA): Principal Problem (Resolved): Sepsis (CMS/HCC) Active Problems: Type 2 diabetes mellitus (CMS/HCC) Chronic pulmonary embolism (CMS/HCC) Pulmonary hypertension and chronic cor pulmonale Acute on chronic HFpEF Morbid obesity (CMS/HCC)  Hospital Course:  Patient is a 77 year old woman who presented to the emergency room on 09/29/2022 with septic shock. Although cultures showed no growth, patient's clinical presentation was that of gram-negative sepsis. This was treated with 5 days of IV ceftriaxone.  Patient had an echocardiogram during her hospitalization which showed severe cor pulmonale and pulmonary hypertension, RVSP = 58 mmHg. Patient is morbidly obese with a BMI of 36, hypersomnolent and pCO2 on a.m. VBG = 55. There is concern the patient may have OHS and OSA, she has been referred to pulmonology for evaluation after discharge.  Post Discharge Follow Up Issues:  With pulmonology as above.   Past Medical History:  Diagnosis Date   Arthritis    Depression    Hypertension     Pulmonary embolism (HCC)        Home Medications Prior to Admission medications   Medication Sig Start Date End Date Taking? Authorizing Provider  acetaminophen (TYLENOL) 325 MG tablet Take 650 mg by mouth every 6 (six) hours as needed for mild pain or fever.     [provider]  acetaminophen (TYLENOL) 500 MG tablet Take 1,000 mg by mouth 2 (two) times daily.     [provider]  albuterol (VENTOLIN HFA) 108 (90 Base) MCG/ACT inhaler Inhale 2 puffs into the lungs 3 (three) times daily.    [provider]  aspirin EC 81 MG tablet Take 81 mg by mouth daily.    [provider]  diclofenac Sodium (VOLTAREN) 1 % GEL Apply 4 g topically 4 (four) times daily as needed. APPLY 4 G TOPICALLY TO HIPS BIL QID PRN FOR ARTHRITIS PAIN 09/14/19   [provider]  DULoxetine (CYMBALTA) 60 MG capsule Take 60 mg by mouth daily.    [provider]  ferrous sulfate 325 (65 FE) MG tablet Take 325 mg by mouth daily with breakfast.    [provider]  fluticasone (FLONASE) 50 MCG/ACT nasal spray 2 Sprays by Nasal route nightly to relieve seasonal allergy    [provider]  furosemide (LASIX) 20 MG tablet Take 20 mg by mouth daily.    [provider]  gabapentin (NEURONTIN) 600 MG tablet Take 600 mg by mouth 3 (three) times daily.    [provider]  insulin glargine (LANTUS) 100 UNIT/ML injection Inject 40 Units into the skin at bedtime.  [provider]  insulin lispro (HUMALOG) 100 UNIT/ML KwikPen CBG with meals101-150=6 units ,151-200=8units,201-250=10units,251-300=14 units,301-350=18units 351-400=22units,greater than 400=26 units    [provider]  lisinopril (ZESTRIL) 2.5 MG tablet Take 2.5 mg by mouth daily.    [provider]  loratadine (CLARITIN) 10 MG tablet Take 10 mg by mouth daily.     [provider]  metFORMIN (GLUCOPHAGE) 500 MG tablet Take 250 mg by mouth daily with  supper.    [provider]  metoprolol tartrate (LOPRESSOR) 25 MG tablet Take 12.5 mg by mouth 2 (two) times daily. 0.5 tablet to = 12.5 mg    [provider]  pantoprazole (PROTONIX) 40 MG tablet Take 40 mg by mouth daily.    [provider]  polyethylene glycol (MIRALAX / GLYCOLAX) packet Take 17 g by mouth daily.    [provider]  potassium chloride (K-DUR,KLOR-CON) 10 MEQ tablet Take 10 mEq by mouth daily.    [provider]  Propylene Glycol-Glycerin ([employer] ARTIFICIAL TEARS OP) Place 1 drop into both eyes 3 (three) times daily.    [provider]  Sodium Fluoride (PREVIDENT 5000 BOOSTER DT) Place 1 application onto teeth daily. Brush on teeth with toothbrush after PM mouth care, spit out excess, do not rinse    [provider]      Allergies    Patient has no known allergies.    Review of Systems   Review of Systems A 10 point review of systems was performed and is negative unless otherwise reported in HPI.  Physical Exam Updated Vital Signs BP (!) 77/35 (BP Location: Right Arm)   Pulse 91   Temp 98.2 F (36.8 C) (Oral)   Resp 20   Ht 5\' 1"  (1.549 m)   Wt 100 kg   SpO2 100%   BMI 41.66 kg/m  Physical Exam General: Normal appearing {Desc; female/female:11659}, lying in bed.  HEENT: PERRLA, Sclera anicteric, MMM, trachea midline.  Cardiology: RRR, no murmurs/rubs/gallops. BL radial and DP pulses equal bilaterally.  Resp: Normal respiratory rate and effort. CTAB, no wheezes, rhonchi, crackles.  Abd: Soft, non-tender, non-distended. No rebound tenderness or guarding.  GU: Deferred. MSK: No peripheral edema or signs of trauma. Extremities without deformity or TTP. No cyanosis or clubbing. Skin: warm, dry. No rashes or lesions. Back: No CVA tenderness Neuro: A&Ox4, CNs II-XII grossly intact. MAEs. Sensation grossly intact.  Psych: Normal mood and affect.   ED Results / Procedures / Treatments   Labs (all labs  ordered are listed, but only abnormal results are displayed) Labs Reviewed  SARS CORONAVIRUS 2 BY RT PCR  CULTURE, BLOOD (ROUTINE X 2)  CULTURE, BLOOD (ROUTINE X 2)  CBC WITH DIFFERENTIAL/PLATELET  COMPREHENSIVE METABOLIC PANEL  LACTIC ACID, PLASMA  LACTIC ACID, PLASMA  MAGNESIUM  URINALYSIS, W/ REFLEX TO CULTURE (INFECTION SUSPECTED)  BLOOD GAS, VENOUS  I-STAT CHEM 8, ED  TROPONIN I (HIGH SENSITIVITY)    EKG None  Radiology No results found.  Procedures Procedures  {Document cardiac monitor, telemetry assessment procedure when appropriate:1}  Medications Ordered in ED Medications  sodium chloride 0.9 % bolus 250 mL (has no administration in time range)    ED Course/ Medical Decision Making/ A&P                          Medical Decision Making Amount and/or Complexity of Data Reviewed Labs: ordered. Decision-making details documented in ED Course. Radiology: ordered.  Risk Prescription drug management.  Decision regarding hospitalization.    This patient presents to the ED for concern of ***, this involves an extensive number of treatment options, and is a complaint that carries with it a high risk of complications and morbidity.  I considered the following differential and admission for this acute, potentially life threatening condition.   MDM:    ***Given her h/o pulmonary hypertension, she already received 800 cc NS by EMS, RNs hung NS bolus in ED and patient received 100 cc bolus in ED with no effect. Will avoid further fluid resuscitation as it does not appear to have helped anyway and given her h/o pulm HTN. Will start norepinephrine.  Consider different kinds of shock***  Clinical Course as of 10/19/22 1702  Fri Oct 18, 2022  1934 Starting norepi [HN]  2008 Pt satting 70s% on [place], but was mouth breathing as well. Now on NRB, will give 3 duonebs and solumedrol as well [HN]  2013 SBP 95 mmHg currently [HN]  2048 Lactic Acid, Venous(!!): 14.3 [HN]  2051  Creatinine(!): 5.40 Most recent value was 0.80 on 10/01/22 in care everywhere [HN]  2052 Glucose(!): 55 Will give D10 [HN]  2053 WBC(!): 11.5 +leukocytosis [HN]  2053 pCO2, Ven: 50 No hypercarbia [HN]  2053 SARS Coronavirus 2 by RT PCR: NEGATIVE neg [HN]  2053 BP: 116/68 On 12 mcg/min of norepinephrine [HN]  2053 Potassium(!): 6.2 For ARF and hyperkalemia, am consulting nephrology. Hypoglycemic so will avoid insulin for now. She has no EKG changes. Will give lasix 20 mg IV. She takes lasix 20 mg PO at home. Pressure support w/ norepi.  [HN]  2055 Consulted to intensivist. [HN]  2154 Lactic Acid, Venous(!!): 11.6 Decreasing [HN]  2154 Troponin I (High Sensitivity)(!!): 261 Likely in s/o critical illness, will trend [HN]    Clinical Course User Index [HN] Loetta Rough, MD    Labs: I Ordered, and personally interpreted labs.  The pertinent results include:  those listed above  Imaging Studies ordered: I ordered imaging studies including CXR I independently visualized and interpreted imaging. I agree with the radiologist interpretation  Additional history obtained from ***.  External records from outside source obtained and reviewed including ***  Cardiac Monitoring: The patient was maintained on a cardiac monitor.  I personally viewed and interpreted the cardiac monitored which showed an underlying rhythm of: ***  Reevaluation: After the interventions noted above, I reevaluated the patient and found that they have :{resolved/improved/worsened:23923::"improved"}  Social Determinants of Health: ***  Disposition:  ***  Co morbidities that complicate the patient evaluation  Past Medical History:  Diagnosis Date   Arthritis    Depression    Hypertension    Pulmonary embolism (HCC)      Medicines Meds ordered this encounter  Medications   DISCONTD: sodium chloride 0.9 % bolus 1,000 mL   sodium chloride 0.9 % bolus 250 mL    I have reviewed the patients home  medicines and have made adjustments as needed  Problem List / ED Course: Problem List Items Addressed This Visit   None        {Document critical care time when appropriate:1} {Document review of labs and clinical decision tools ie heart score, Chads2Vasc2 etc:1}  {Document your independent review of radiology images, and any outside records:1} {Document your discussion with family members, caretakers, and with consultants:1} {Document social determinants of health affecting pt's care:1} {Document your decision making why or why not admission, treatments were needed:1}  This note was created using dictation  software, which may contain spelling or grammatical errors.

## 2022-10-18 NOTE — Progress Notes (Signed)
A consult was received from an ED physician for cefepime & vancomycin per pharmacy dosing.  The patient's profile has been reviewed for ht/wt/allergies/indication/available labs.    A one time order has been placed for cefepime 2 gm & Vancomycin  2 gm.    Further antibiotics/pharmacy consults should be ordered by admitting physician if indicated.                       Thank you,  Herby Abraham, Pharm.D Use secure chat for questions 10/18/2022 8:00 PM

## 2022-10-18 NOTE — ED Notes (Signed)
Patient's iStat lactic acid was 13.62. Result will not transfer over to [facility]. RN and MD aware of result.

## 2022-10-18 NOTE — ED Notes (Signed)
Spoke to ICU CN- she will call when primary nurse is ready for report

## 2022-10-18 NOTE — H&P (Addendum)
NAME:  Kristine Todd, MRN:  [id number], DOB:  18-Apr-1945, LOS: 0 ADMISSION DATE:  10/18/2022, CONSULTATION DATE:  10/18/2022 REFERRING MD:  ED MD, CHIEF COMPLAINT:  hyperkalemia and AKI  History of Present Illness:  A 77 yr old female patient with dementia, DNR/DNI, HTN, DM-2, dyslipidemia, GERD, CKD-3, Fe def anemia, morbid obesity, depression, D-CHF, chronic hypoxic resp failure (on 4 L [place]), PH, and hx of PE on Eliquis, who came from NH with abnormal labs, hypotension, and wheezing. Possible hx of COPD. In ED, she was given 0.9 L NS0.9% bolus and started Levophed. Given Cefepime, Vanco, Flagyl and IV Ca after obtaining one set of BC due to difficult stick. Given steroids IV, Duoneb, and Lasix for hyperkalemia. Hypoglycemic and started on D10. On 4 L [place] SpO2 98%.    Pertinent  Medical History  Dementia, DNR/DNI, HTN, DM-2, dyslipidemia, GERD, CKD-3, Fe def anemia, morbid obesity, depression, D-CHF, chronic hypoxic resp failure (on 4 L [place]), PH, hx of PE on Eliquis. Possible hx of COPD.  Significant Hospital Events: Including procedures, antibiotic start and stop dates in addition to other pertinent events     Interim History / Subjective:    Objective   Blood pressure 99/61, pulse (!) 102, temperature 98.2 F (36.8 C), temperature source Oral, resp. rate 13, height 5\' 1"  (1.549 m), weight 100 kg, SpO2 100%.       No intake or output data in the 24 hours ending 10/18/22 2233 Filed Weights   10/18/22 1853  Weight: 100 kg  In sinus  Levophed 10 mch/min   Examination: General: Lethargic, disoriented, and comfortable. Awake and follows simple commands   HENT: PERL, normal oral mucosa. No LNE or thyromegaly. No JVD Lungs: symmetrical air entry bilaterally. No crackles or wheezing Cardiovascular: NL S1/S2. No m/g/r Abdomen: no distension or tenderness Extremities: tarce edema. Symmetrical  Neuro: nonfocal   Back: stage 2 bedsore   Resolved Hospital Problem list     Assessment &  Plan:  Septic shock: source is not clear yet, could be UTI Hyperkalemia: AKI and ACEI AKI/CKD-3 Lactic acidosis and AGMA Type 2 NSTEMI (demand ischemia) HypoCa Stage 2 bedsore DNR/DNI GERD Dementia HTN DM-2 Dyslipidemia Fe def anemia Morbid obesity Depression D-CHF Chronic hypoxic resp failure (on 4 L [place]) PH Hx of PE on Eliquis Possible COPD  PLAN: UA and Cx Rocephin MRSA screening F/u Bcx1 May need a-line and CVC Foley cath I/O chart Consult nephrology D/c ACEI  Serial LA and trop Echo Consider cardiology eval Resume Eliquis  Duoneb PRN IV Ca SCD GI prophylaxis Glycemic control HOB elevation IVF: NS 0.9% @ 100 cc/hr for 24 hrs only and reassess Wound care consult  Best Practice (right click and "Reselect all SmartList Selections" daily)   Diet/type: NPO DVT prophylaxis: SCD GI prophylaxis: H2B Lines: N/A Foley:  Yes, and it is still needed Code Status:  DNR Last date of multidisciplinary goals of care discussion []   Labs   CBC: Recent Labs  Lab 10/18/22 1920 10/18/22 2024  WBC 11.5*  --   NEUTROABS 9.5*  --   HGB 11.0* 13.3  HCT 39.5 39.0  MCV 91.0  --   PLT 298  --     Basic Metabolic Panel: Recent Labs  Lab 10/18/22 1920 10/18/22 2024 10/18/22 2205  NA 141 138  --   K 6.2* 6.2*  --   CL 94* 101  --   CO2 20*  --   --   GLUCOSE 63* 55*  --  BUN 73* 75*  --   CREATININE 4.87* 5.40*  --   CALCIUM 9.2  --   --   MG 3.0*  --   --   PHOS  --   --  7.9*   GFR: Estimated Creatinine Clearance: 9.6 mL/min (A) (by C-G formula based on SCr of 5.4 mg/dL (H)). Recent Labs  Lab 10/18/22 1920 10/18/22 2026 10/18/22 2143  WBC 11.5*  --   --   LATICACIDVEN  --  14.3* 11.6*    Liver Function Tests: Recent Labs  Lab 10/18/22 1920  AST 33  ALT 22  ALKPHOS 63  BILITOT 0.7  PROT 7.1  ALBUMIN 3.4*   No results for input(s): "LIPASE", "AMYLASE" in the last 168 hours. No results for input(s): "AMMONIA" in the last 168  hours.  ABG    Component Value Date/Time   HCO3 21.4 10/18/2022 1920   TCO2 22 10/18/2022 2024   ACIDBASEDEF 6.3 (H) 10/18/2022 1920   O2SAT 32.3 10/18/2022 1920     Coagulation Profile: Recent Labs  Lab 10/18/22 1920  INR 1.7*    Cardiac Enzymes: No results for input(s): "CKTOTAL", "CKMB", "CKMBINDEX", "TROPONINI" in the last 168 hours.  HbA1C: Hemoglobin A1C  Date/Time Value Ref Range Status  01/27/2020 12:00 AM 8.2  Final  11/01/2019 12:00 AM 9.5  Final    CBG: Recent Labs  Lab 10/18/22 2124  GLUCAP 120*    Review of Systems:   Dementia  Past Medical History:  She,  has a past medical history of Arthritis, Depression, Hypertension, and Pulmonary embolism (HCC).   Surgical History:   Past Surgical History:  Procedure Laterality Date   ABDOMINAL HYSTERECTOMY     BACK SURGERY     REPLACEMENT TOTAL KNEE BILATERAL       Social History:   reports that she quit smoking about 6 years ago. She started smoking about 61 years ago. She has never used smokeless tobacco. She reports that she does not drink alcohol and does not use drugs.   Family History:  Her family history includes Cancer in her mother and sister; Diabetes in her maternal grandfather, maternal grandmother, mother, and sister; Hypertension in her father, maternal grandfather, maternal grandmother, and mother.   Allergies No Known Allergies   Home Medications  Prior to Admission medications   Medication Sig Start Date End Date Taking? Authorizing Provider  albuterol (PROVENTIL) (2.5 MG/3ML) 0.083% nebulizer solution Take 2.5 mg by nebulization every 4 (four) hours as needed for wheezing.   Yes [provider]  Amino Acids-Protein Hydrolys (PRO-STAT) LIQD Take 30 mLs by mouth in the morning. 10/11/22 11/11/22 Yes [provider]  ARTIFICIAL TEAR SOLUTION OP Place 1 drop into both eyes 3 (three) times daily.   Yes [provider]  aspirin EC 81 MG tablet Take 81 mg by mouth  daily.   Yes [provider]  Cholecalciferol (VITAMIN D3) 50 MCG (2000 UT) TABS Take 2,000 Units by mouth in the morning.   Yes [provider]  cycloSPORINE (RESTASIS) 0.05 % ophthalmic emulsion Place 1 drop into both eyes 2 (two) times daily.   Yes [provider]  ELIQUIS 5 MG TABS tablet Take 5 mg by mouth 2 (two) times daily.   Yes [provider]  ferrous sulfate 325 (65 FE) MG tablet Take 325 mg by mouth daily with breakfast.   Yes [provider]  fluticasone (FLONASE) 50 MCG/ACT nasal spray Place 2 sprays into both nostrils in the morning.  Yes [provider]  furosemide (LASIX) 20 MG tablet Take 20 mg by mouth 2 (two) times daily.   Yes [provider]  gabapentin (NEURONTIN) 300 MG capsule Take 300 mg by mouth 3 (three) times daily.   Yes [provider]  lisinopril (ZESTRIL) 2.5 MG tablet Take 2.5 mg by mouth daily.   Yes [provider]  loratadine (CLARITIN) 10 MG tablet Take 10 mg by mouth daily.    Yes [provider]  magnesium oxide (MAG-OX) 400 (240 Mg) MG tablet Take 400 mg by mouth 2 (two) times daily.   Yes [provider]  metFORMIN (GLUCOPHAGE) 1000 MG tablet Take 1,000 mg by mouth 2 (two) times daily with a meal.   Yes [provider]  metoprolol tartrate (LOPRESSOR) 25 MG tablet Take 12.5 mg by mouth 2 (two) times daily.   Yes [provider]  mirtazapine (REMERON) 15 MG tablet Take 7.5 mg by mouth at bedtime.   Yes [provider]  NON FORMULARY Take 120 mLs by mouth See admin instructions. MED PASS- Drink 120 ml's by mouth at 8 AM and 4 PM   Yes [provider]  OXYGEN Inhale 4 L/min into the lungs continuous.   Yes [provider]  pantoprazole (PROTONIX) 40 MG tablet Take 40 mg by mouth daily.   Yes [provider]  rosuvastatin (CRESTOR) 5 MG tablet Take 5 mg by mouth at bedtime.   Yes [provider]   Sodium Fluoride (PREVIDENT 5000 BOOSTER DT) Place 1 application  onto teeth See admin instructions. Brush 1 application onto the teeth and brush every evening   Yes [provider]     Critical care time: 65 min

## 2022-10-18 NOTE — Sepsis Progress Note (Addendum)
Elink monitoring for the code sepsis protocol.   Per MD limited on being able to administer fluids for sepsis protocol due to Pulm HTN and CHF hx.

## 2022-10-18 NOTE — ED Triage Notes (Signed)
Patient brought in by EMS from [facility] with c/o abnormal labs. Per EMS there is something going around in facility. Patient GFR was decreased and was hyperkalemic. She denies N/V/D. EMS started 22g in L hand and 20g in L forearm, giving 800cc of NS.  80/40 CBG: 99 97.4

## 2022-10-18 NOTE — ED Notes (Signed)
ED TO INPATIENT HANDOFF REPORT  Name/Age/Gender Kristine Todd 77 y.o. female  Code Status    Code Status Orders  (From admission, onward)           Start     Ordered   10/18/22 2216  Do not attempt resuscitation (DNR)- Limited -Do Not Intubate (DNI)  Continuous       Question Answer Comment  If pulseless and not breathing No CPR or chest compressions.   In Pre-Arrest Conditions (Patient Is Breathing and Has A Pulse) Do not intubate. Provide all appropriate non-invasive medical interventions. Avoid ICU transfer unless indicated or required.   Consent: Discussion documented in EHR or advanced directives reviewed      10/18/22 2220           Code Status History     Date Active Date Inactive Code Status Order ID Comments User Context   06/17/2019 2215 11/15/2019 1126 DNR [id number]  Roena Malady, PA-C Outpatient      Advance Directive Documentation    Flowsheet Row Most Recent Value  Type of Advance Directive Out of facility DNR (pink MOST or yellow form)  Pre-existing out of facility DNR order (yellow form or pink MOST form) Pink MOST/Yellow Form most recent copy in chart - Physician notified to receive inpatient order  "MOST" Form in Place? --       Home/SNF/Other Nursing Home  Chief Complaint Shock [facility]) [R57.9]  Level of Care/Admitting Diagnosis ED Disposition     ED Disposition  Admit   Condition  --   Comment  Hospital Area: [facility] [[id number]]  Level of Care: ICU [6]  May admit patient to [facility] or [facility] if equivalent level of care is available:: Yes  Covid Evaluation: Confirmed COVID Negative  Diagnosis: Shock [facility]) [[id number]]  Admitting Physician: Patrici Ranks [[id number]]  Attending Physician: Patrici Ranks [[id number]]  Certification:: I certify this patient will need inpatient services for at least 2 midnights  Expected Medical Readiness: 10/21/2022          Medical History Past Medical History:   Diagnosis Date   Arthritis    Depression    Hypertension    Pulmonary embolism (HCC)     Allergies No Known Allergies  IV Location/Drains/Wounds Patient Lines/Drains/Airways Status     Active Line/Drains/Airways     Name Placement date Placement time Site Days   Peripheral IV 10/18/22 20 G 1" Anterior;Right Forearm 10/18/22  1824  Forearm  less than 1   Peripheral IV 10/18/22 22 G 1" Anterior;Left Forearm 10/18/22  1824  Forearm  less than 1   Peripheral IV 10/18/22 24 G Left Hand 10/18/22  1900  Hand  less than 1            Labs/Imaging Results for orders placed or performed during the hospital encounter of 10/18/22 (from the past 48 hour(s))  CBC with Differential     Status: Abnormal   Collection Time: 10/18/22  7:20 PM  Result Value Ref Range   WBC 11.5 (H) 4.0 - 10.5 K/uL   RBC 4.34 3.87 - 5.11 MIL/uL   Hemoglobin 11.0 (L) 12.0 - 15.0 g/dL   HCT 01.0 27.2 - 53.6 %   MCV 91.0 80.0 - 100.0 fL   MCH 25.3 (L) 26.0 - 34.0 pg   MCHC 27.8 (L) 30.0 - 36.0 g/dL   RDW 64.4 (H) 03.4 - 74.2 %   Platelets 298 150 - 400 K/uL   nRBC  0.0 0.0 - 0.2 %   Neutrophils Relative % 83 %   Neutro Abs 9.5 (H) 1.7 - 7.7 K/uL   Lymphocytes Relative 11 %   Lymphs Abs 1.3 0.7 - 4.0 K/uL   Monocytes Relative 6 %   Monocytes Absolute 0.7 0.1 - 1.0 K/uL   Eosinophils Relative 0 %   Eosinophils Absolute 0.0 0.0 - 0.5 K/uL   Basophils Relative 0 %   Basophils Absolute 0.0 0.0 - 0.1 K/uL   Immature Granulocytes 0 %   Abs Immature Granulocytes 0.04 0.00 - 0.07 K/uL    Comment: Performed at [facility], [address], [place], [place] [address]  Comprehensive metabolic panel     Status: Abnormal   Collection Time: 10/18/22  7:20 PM  Result Value Ref Range   Sodium 141 135 - 145 mmol/L    Comment: ELECTROLYTES REPEATED TO VERIFY   Potassium 6.2 (H) 3.5 - 5.1 mmol/L   Chloride 94 (L) 98 - 111 mmol/L    Comment: ELECTROLYTES REPEATED TO VERIFY   CO2 20 (L) 22 - 32  mmol/L    Comment: DELTA CHECK NOTED   Glucose, Bld 63 (L) 70 - 99 mg/dL    Comment: Glucose reference range applies only to samples taken after fasting for at least 8 hours.   BUN 73 (H) 8 - 23 mg/dL   Creatinine, Ser 9.52 (H) 0.44 - 1.00 mg/dL   Calcium 9.2 8.9 - 84.1 mg/dL   Total Protein 7.1 6.5 - 8.1 g/dL   Albumin 3.4 (L) 3.5 - 5.0 g/dL   AST 33 15 - 41 U/L   ALT 22 0 - 44 U/L   Alkaline Phosphatase 63 38 - 126 U/L   Total Bilirubin 0.7 0.3 - 1.2 mg/dL   GFR, Estimated 9 (L) >60 mL/min    Comment: (NOTE) Calculated using the CKD-EPI Creatinine Equation (2021)    Anion gap 27 (H) 5 - 15    Comment: ELECTROLYTES REPEATED TO VERIFY Performed at [facility], [address] [place], [place] [address]   Magnesium     Status: Abnormal   Collection Time: 10/18/22  7:20 PM  Result Value Ref Range   Magnesium 3.0 (H) 1.7 - 2.4 mg/dL    Comment: Performed at [facility], [address] [place], [place] [address]  Troponin I (High Sensitivity)     Status: Abnormal   Collection Time: 10/18/22  7:20 PM  Result Value Ref Range   Troponin I (High Sensitivity) 261 (HH) <18 ng/L    Comment: CRITICAL RESULT CALLED TO, READ BACK BY AND VERIFIED WITH BLACK M. @2107  10/18/2022 MCLEAN K. (NOTE) Elevated high sensitivity troponin I (hsTnI) values and significant  changes across serial measurements may suggest ACS but many other  chronic and acute conditions are known to elevate hsTnI results.  Refer to the "Links" section for chest pain algorithms and additional  guidance. Performed at [facility], [address] [place], [place] [address]   Blood gas, venous (at [facility] and [facility])     Status: Abnormal   Collection Time: 10/18/22  7:20 PM  Result Value Ref Range   pH, Ven 7.24 (L) 7.25 - 7.43   pCO2, Ven 50 44 - 60 mmHg   pO2, Ven <31 (LL) 32 - 45 mmHg    Comment: CRITICAL RESULT CALLED TO, READ BACK BY AND VERIFIED WITH: Carli Lefevers S.  @ 1954 10/18/2022  MCLEAN K.    Bicarbonate  21.4 20.0 - 28.0 mmol/L   Acid-base deficit 6.3 (H) 0.0 - 2.0 mmol/L   O2 Saturation 32.3 %   Patient temperature 37.0     Comment: Performed at [facility], [address], [place], [place] [address]  Protime-INR     Status: Abnormal   Collection Time: 10/18/22  7:20 PM  Result Value Ref Range   Prothrombin Time 20.0 (H) 11.4 - 15.2 seconds   INR 1.7 (H) 0.8 - 1.2    Comment: (NOTE) INR goal varies based on device and disease states. Performed at [facility], [address] [place], [place] [address]   APTT     Status: None   Collection Time: 10/18/22  7:20 PM  Result Value Ref Range   aPTT 32 24 - 36 seconds    Comment: Performed at [facility], [address], [place], [place] [address]  SARS Coronavirus 2 by RT PCR (hospital order, performed in [facility] hospital lab) *cepheid single result test* Anterior Nasal Swab     Status: None   Collection Time: 10/18/22  8:01 PM   Specimen: Anterior Nasal Swab  Result Value Ref Range   SARS Coronavirus 2 by RT PCR NEGATIVE NEGATIVE    Comment: (NOTE) SARS-CoV-2 target nucleic acids are NOT DETECTED.  The SARS-CoV-2 RNA is generally detectable in upper and lower respiratory specimens during the acute phase of infection. The lowest concentration of SARS-CoV-2 viral copies this assay can detect is 250 copies / mL. A negative result does not preclude SARS-CoV-2 infection and should not be used as the sole basis for treatment or other patient management decisions.  A negative result may occur with improper specimen collection / handling, submission of specimen other than nasopharyngeal swab, presence of viral mutation(s) within the areas targeted by this assay, and inadequate number of viral copies (<250 copies / mL). A negative result must be combined with clinical observations, patient history, and epidemiological  information.  Fact Sheet for Patients:   RoadLapTop.co.za  Fact Sheet for Healthcare Providers: http://kim-miller.com/  This test is not yet approved or  cleared by the [place] FDA and has been authorized for detection and/or diagnosis of SARS-CoV-2 by FDA under an Emergency Use Authorization (EUA).  This EUA will remain in effect (meaning this test can be used) for the duration of the COVID-19 declaration under Section 564(b)(1) of the Act, 21 U.S.C. section 360bbb-3(b)(1), unless the authorization is terminated or revoked sooner.  Performed at [facility], [address], [place], [place] [address]   I-stat chem 8, ED (not at [facility], [facility] or [facility])     Status: Abnormal   Collection Time: 10/18/22  8:24 PM  Result Value Ref Range   Sodium 138 135 - 145 mmol/L   Potassium 6.2 (H) 3.5 - 5.1 mmol/L   Chloride 101 98 - 111 mmol/L   BUN 75 (H) 8 - 23 mg/dL   Creatinine, Ser 2.13 (H) 0.44 - 1.00 mg/dL   Glucose, Bld 55 (L) 70 - 99 mg/dL    Comment: Glucose reference range applies only to samples taken after fasting for at least 8 hours.   Calcium, Ion 1.03 (L) 1.15 - 1.40 mmol/L   TCO2 22 22 - 32 mmol/L   Hemoglobin 13.3 12.0 - 15.0 g/dL   HCT 08.6 57.8 - 46.9 %  I-Stat CG4 Lactic Acid     Status: Abnormal   Collection Time: 10/18/22  8:26 PM  Result Value  Ref Range   Lactic Acid, Venous 14.3 (HH) 0.5 - 1.9 mmol/L   Comment NOTIFIED PHYSICIAN   CBG monitoring, ED     Status: Abnormal   Collection Time: 10/18/22  9:24 PM  Result Value Ref Range   Glucose-Capillary 120 (H) 70 - 99 mg/dL    Comment: Glucose reference range applies only to samples taken after fasting for at least 8 hours.  Troponin I (High Sensitivity)     Status: Abnormal   Collection Time: 10/18/22  9:42 PM  Result Value Ref Range   Troponin I (High Sensitivity) 198 (HH) <18 ng/L    Comment: DELTA CHECK NOTED CRITICAL RESULT CALLED TO, READ BACK  BY AND VERIFIED WITH Ivelis Norgard S. @ 2225 10/18/2022 MCLEAN K. (NOTE) Elevated high sensitivity troponin I (hsTnI) values and significant  changes across serial measurements may suggest ACS but many other  chronic and acute conditions are known to elevate hsTnI results.  Refer to the "Links" section for chest pain algorithms and additional  guidance. Performed at [facility], [address] [place], [place] [address]   I-Stat CG4 Lactic Acid     Status: Abnormal   Collection Time: 10/18/22  9:43 PM  Result Value Ref Range   Lactic Acid, Venous 11.6 (HH) 0.5 - 1.9 mmol/L   Comment NOTIFIED PHYSICIAN   Urinalysis, w/ Reflex to Culture (Infection Suspected) -Urine, Clean Catch     Status: Abnormal   Collection Time: 10/18/22 10:00 PM  Result Value Ref Range   Specimen Source URINE, CLEAN CATCH    Color, Urine YELLOW YELLOW   APPearance HAZY (A) CLEAR   Specific Gravity, Urine 1.012 1.005 - 1.030   pH 5.0 5.0 - 8.0   Glucose, UA NEGATIVE NEGATIVE mg/dL   Hgb urine dipstick LARGE (A) NEGATIVE   Bilirubin Urine NEGATIVE NEGATIVE   Ketones, ur NEGATIVE NEGATIVE mg/dL   Protein, ur 30 (A) NEGATIVE mg/dL   Nitrite NEGATIVE NEGATIVE   Leukocytes,Ua LARGE (A) NEGATIVE   RBC / HPF >50 0 - 5 RBC/hpf   WBC, UA >50 0 - 5 WBC/hpf    Comment:        Reflex urine culture not performed if WBC <=10, OR if Squamous epithelial cells >5. If Squamous epithelial cells >5 suggest recollection.    Bacteria, UA MANY (A) NONE SEEN   Squamous Epithelial / HPF 11-20 0 - 5 /HPF   Mucus PRESENT    Budding Yeast PRESENT     Comment: Performed at [facility], [address] [place], [place] [address]  Phosphorus     Status: Abnormal   Collection Time: 10/18/22 10:05 PM  Result Value Ref Range   Phosphorus 7.9 (H) 2.5 - 4.6 mg/dL    Comment: Performed at [facility], [address] [place], [place] [address]   DG Chest Portable 1 View  Result  Date: 10/18/2022 CLINICAL DATA:  Hypoxia EXAM: PORTABLE CHEST 1 VIEW COMPARISON:  09/26/2022 FINDINGS: Heart and mediastinal contours are within normal limits. No focal opacities or effusions. No acute bony abnormality. IMPRESSION: No active disease. Electronically Signed   By: Charlett Nose M.D.   On: 10/18/2022 22:14    Pending Labs Unresulted Labs (From admission, onward)     Start     Ordered   10/19/22 0500  CBC  Tomorrow morning,   R        10/18/22 2220   10/19/22 0500  Basic metabolic panel  Tomorrow morning,  R        10/18/22 2220   10/19/22 0500  Magnesium  Tomorrow morning,   R        10/18/22 2220   10/19/22 0500  Phosphorus  Tomorrow morning,   R        10/18/22 2220   10/19/22 0500  Calcium, ionized  Tomorrow morning,   R        10/18/22 2220   10/18/22 2252  MRSA Next Gen by PCR, Nasal  (MRSA Screening)  Once,   R        10/18/22 2254   10/18/22 2217  Urinalysis, Routine w reflex microscopic -Urine, Catheterized  Once,   R       Question:  Specimen Source  Answer:  Urine, Catheterized   10/18/22 2220   10/18/22 2151  Hemoglobin A1c  Once,   URGENT        10/18/22 2151   10/18/22 1917  Blood culture (routine x 2)  BLOOD CULTURE X 2,   R (with STAT occurrences)      10/18/22 1916            Vitals/Pain Today's Vitals   10/18/22 2110 10/18/22 2200 10/18/22 2230 10/18/22 2249  BP: 99/61 (!) 99/49 (!) 99/52   Pulse: (!) 102 (!) 49 100   Resp: 13 14 13    Temp:    99.2 F (37.3 C)  TempSrc:    Rectal  SpO2: 100% 99% 99%   Weight:      Height:      PainSc:        Isolation Precautions No active isolations  Medications Medications  vancomycin (VANCOREADY) IVPB 2000 mg/400 mL (2,000 mg Intravenous New Bag/Given 10/18/22 2209)  norepinephrine (LEVOPHED) 4mg  in (0.016 mg/mL) premix infusion (10 mcg/min Intravenous Rate/Dose Change 10/18/22 2025)  calcium gluconate 1 g/ 50 mL sodium chloride IVPB (1,000 mg Intravenous New Bag/Given 10/18/22 2219)  0.9  %  sodium chloride infusion (250 mLs Intravenous New Bag/Given 10/18/22 2210)  docusate sodium (COLACE) capsule 100 mg (has no administration in time range)  polyethylene glycol (MIRALAX / GLYCOLAX) packet 17 g (has no administration in time range)  cefTRIAXone (ROCEPHIN) 1 g in sodium chloride 0.9 % 100 mL IVPB (has no administration in time range)  insulin aspart (novoLOG) injection 1-3 Units ( Subcutaneous Not Given 10/18/22 2312)  famotidine (PEPCID) tablet 10 mg (has no administration in time range)  apixaban (ELIQUIS) tablet 5 mg (5 mg Oral Given 10/18/22 2316)  sodium chloride 0.9 % bolus 250 mL (0 mLs Intravenous Stopped 10/18/22 2203)  ipratropium-albuterol (DUONEB) 0.5-2.5 (3) MG/3ML nebulizer solution 3 mL (3 mLs Nebulization Given 10/18/22 2014)  ceFEPIme (MAXIPIME) 2 g in sodium chloride 0.9 % 100 mL IVPB (0 g Intravenous Stopped 10/18/22 2052)  metroNIDAZOLE (FLAGYL) IVPB 500 mg (0 mg Intravenous Stopped 10/18/22 2134)  methylPREDNISolone sodium succinate (SOLU-MEDROL) 125 mg/2 mL injection 125 mg (125 mg Intravenous Given 10/18/22 2018)  ipratropium-albuterol (DUONEB) 0.5-2.5 (3) MG/3ML nebulizer solution 3 mL (3 mLs Nebulization Given 10/18/22 2020)  dextrose (D10W) 10% bolus 250 mL (0 mLs Intravenous Stopped 10/18/22 2212)  furosemide (LASIX) injection 20 mg (20 mg Intravenous Given 10/18/22 2129)  aspirin chewable tablet 324 mg (324 mg Oral Given 10/18/22 2315)    Or  aspirin suppository 300 mg ( Rectal See Alternative 10/18/22 2315)    Mobility non-ambulatory

## 2022-10-19 ENCOUNTER — Encounter (HOSPITAL_COMMUNITY): Payer: Self-pay | Admitting: Pulmonary Disease

## 2022-10-19 ENCOUNTER — Inpatient Hospital Stay (HOSPITAL_COMMUNITY): Payer: Medicare Other

## 2022-10-19 DIAGNOSIS — N171 Acute kidney failure with acute cortical necrosis: Secondary | ICD-10-CM | POA: Diagnosis not present

## 2022-10-19 DIAGNOSIS — R579 Shock, unspecified: Secondary | ICD-10-CM

## 2022-10-19 DIAGNOSIS — I214 Non-ST elevation (NSTEMI) myocardial infarction: Secondary | ICD-10-CM | POA: Diagnosis not present

## 2022-10-19 LAB — BASIC METABOLIC PANEL
Anion gap: 25 — ABNORMAL HIGH (ref 5–15)
Anion gap: 24 — ABNORMAL HIGH (ref 5–15)
Anion gap: 16 — ABNORMAL HIGH (ref 5–15)
Anion gap: 15 (ref 5–15)
BUN: 74 mg/dL — ABNORMAL HIGH (ref 8–23)
BUN: 75 mg/dL — ABNORMAL HIGH (ref 8–23)
BUN: 76 mg/dL — ABNORMAL HIGH (ref 8–23)
BUN: 74 mg/dL — ABNORMAL HIGH (ref 8–23)
CO2: 17 mmol/L — ABNORMAL LOW (ref 22–32)
CO2: 18 mmol/L — ABNORMAL LOW (ref 22–32)
CO2: 25 mmol/L (ref 22–32)
CO2: 27 mmol/L (ref 22–32)
Calcium: 8.8 mg/dL — ABNORMAL LOW (ref 8.9–10.3)
Calcium: 8.7 mg/dL — ABNORMAL LOW (ref 8.9–10.3)
Calcium: 8.6 mg/dL — ABNORMAL LOW (ref 8.9–10.3)
Calcium: 8.8 mg/dL — ABNORMAL LOW (ref 8.9–10.3)
Chloride: 96 mmol/L — ABNORMAL LOW (ref 98–111)
Chloride: 95 mmol/L — ABNORMAL LOW (ref 98–111)
Chloride: 94 mmol/L — ABNORMAL LOW (ref 98–111)
Chloride: 96 mmol/L — ABNORMAL LOW (ref 98–111)
Creatinine, Ser: 4.53 mg/dL — ABNORMAL HIGH (ref 0.44–1.00)
Creatinine, Ser: 4.51 mg/dL — ABNORMAL HIGH (ref 0.44–1.00)
Creatinine, Ser: 4.14 mg/dL — ABNORMAL HIGH (ref 0.44–1.00)
Creatinine, Ser: 4.05 mg/dL — ABNORMAL HIGH (ref 0.44–1.00)
GFR, Estimated: 10 mL/min — ABNORMAL LOW (ref >60)
GFR, Estimated: 10 mL/min — ABNORMAL LOW (ref >60)
GFR, Estimated: 11 mL/min — ABNORMAL LOW (ref >60)
GFR, Estimated: 11 mL/min — ABNORMAL LOW (ref >60)
Glucose, Bld: 278 mg/dL — ABNORMAL HIGH (ref 70–99)
Glucose, Bld: 310 mg/dL — ABNORMAL HIGH (ref 70–99)
Glucose, Bld: 288 mg/dL — ABNORMAL HIGH (ref 70–99)
Glucose, Bld: 182 mg/dL — ABNORMAL HIGH (ref 70–99)
Potassium: 5.6 mmol/L — ABNORMAL HIGH (ref 3.5–5.1)
Potassium: 5.8 mmol/L — ABNORMAL HIGH (ref 3.5–5.1)
Potassium: 5.5 mmol/L — ABNORMAL HIGH (ref 3.5–5.1)
Potassium: 5.2 mmol/L — ABNORMAL HIGH (ref 3.5–5.1)
Sodium: 138 mmol/L (ref 135–145)
Sodium: 137 mmol/L (ref 135–145)
Sodium: 135 mmol/L (ref 135–145)
Sodium: 138 mmol/L (ref 135–145)

## 2022-10-19 LAB — CBC
HCT: 37.3 % (ref 36.0–46.0)
Hemoglobin: 10.6 g/dL — ABNORMAL LOW (ref 12.0–15.0)
MCH: 25.7 pg — ABNORMAL LOW (ref 26.0–34.0)
MCHC: 28.4 g/dL — ABNORMAL LOW (ref 30.0–36.0)
MCV: 90.3 fL (ref 80.0–100.0)
Platelets: 300 10*3/uL (ref 150–400)
RBC: 4.13 MIL/uL (ref 3.87–5.11)
RDW: 20 % — ABNORMAL HIGH (ref 11.5–15.5)
WBC: 11.5 10*3/uL — ABNORMAL HIGH (ref 4.0–10.5)
nRBC: 0 % (ref 0.0–0.2)

## 2022-10-19 LAB — ECHOCARDIOGRAM COMPLETE
Area-P 1/2: 2.29 cm2
Height: 61 in
S' Lateral: 2.7 cm
Weight: 3107.6 [oz_av]

## 2022-10-19 LAB — GLUCOSE, CAPILLARY
Glucose-Capillary: 287 mg/dL — ABNORMAL HIGH (ref 70–99)
Glucose-Capillary: 283 mg/dL — ABNORMAL HIGH (ref 70–99)
Glucose-Capillary: 281 mg/dL — ABNORMAL HIGH (ref 70–99)
Glucose-Capillary: 185 mg/dL — ABNORMAL HIGH (ref 70–99)
Glucose-Capillary: 132 mg/dL — ABNORMAL HIGH (ref 70–99)

## 2022-10-19 LAB — HEMOGLOBIN A1C
Hgb A1c MFr Bld: 6.1 % — ABNORMAL HIGH (ref 4.8–5.6)
Mean Plasma Glucose: 128.37 mg/dL

## 2022-10-19 LAB — MAGNESIUM: Magnesium: 2.7 mg/dL — ABNORMAL HIGH (ref 1.7–2.4)

## 2022-10-19 LAB — TROPONIN I (HIGH SENSITIVITY)
Troponin I (High Sensitivity): 178 ng/L (ref <18)
Troponin I (High Sensitivity): 134 ng/L (ref <18)

## 2022-10-19 LAB — PHOSPHORUS: Phosphorus: 7.2 mg/dL — ABNORMAL HIGH (ref 2.5–4.6)

## 2022-10-19 LAB — MRSA NEXT GEN BY PCR, NASAL: MRSA by PCR Next Gen: NOT DETECTED

## 2022-10-19 MED ORDER — INSULIN ASPART 100 UNIT/ML IJ SOLN
0.0000 [IU] | INTRAMUSCULAR | Status: DC
  Administered 2022-10-19: 8 [IU] via SUBCUTANEOUS
  Administered 2022-10-19: 2 [IU] via SUBCUTANEOUS
  Administered 2022-10-19: 3 [IU] via SUBCUTANEOUS
  Administered 2022-10-20 – 2022-10-21 (×3): 2 [IU] via SUBCUTANEOUS

## 2022-10-19 MED ORDER — INFLUENZA VAC A&B SURF ANT ADJ 0.5 ML IM SUSY: 0.5000 mL | PREFILLED_SYRINGE | INTRAMUSCULAR | Status: DC | PRN

## 2022-10-19 MED ORDER — ORAL CARE MOUTH RINSE: 15.0000 mL | OROMUCOSAL | Status: DC | PRN

## 2022-10-19 MED ORDER — PERFLUTREN LIPID MICROSPHERE
1.0000 mL | INTRAVENOUS | Status: AC | PRN
  Administered 2022-10-19: 3 mL via INTRAVENOUS

## 2022-10-19 MED ORDER — SODIUM ZIRCONIUM CYCLOSILICATE 10 G PO PACK
10.0000 g | PACK | Freq: Once | ORAL | Status: DC
  Filled 2022-10-19: qty 1

## 2022-10-19 MED ORDER — SODIUM CHLORIDE 0.9% FLUSH
3.0000 mL | Freq: Two times a day (BID) | INTRAVENOUS | Status: DC
  Administered 2022-10-19 – 2022-10-23 (×9): 3 mL via INTRAVENOUS

## 2022-10-19 MED ORDER — CARMEX CLASSIC LIP BALM EX OINT
1.0000 | TOPICAL_OINTMENT | CUTANEOUS | Status: DC | PRN
  Administered 2022-10-19: 1 via TOPICAL
  Filled 2022-10-19: qty 10

## 2022-10-19 MED ORDER — CHLORHEXIDINE GLUCONATE CLOTH 2 % EX PADS
6.0000 | MEDICATED_PAD | Freq: Every day | CUTANEOUS | Status: DC
  Administered 2022-10-20: 6 via TOPICAL

## 2022-10-19 MED ORDER — PNEUMOCOCCAL 20-VAL CONJ VACC 0.5 ML IM SUSY: 0.5000 mL | PREFILLED_SYRINGE | INTRAMUSCULAR | Status: DC | PRN

## 2022-10-19 NOTE — Progress Notes (Signed)
  Echocardiogram 2D Echocardiogram has been performed.  Kristine Todd 10/19/2022, 9:13 AM

## 2022-10-19 NOTE — ED Notes (Signed)
Called report x 2/ no update/ CN made aware

## 2022-10-19 NOTE — Progress Notes (Addendum)
eLink Physician-Brief Progress Note Patient Name: Kristine Todd DOB: 1945/12/12 MRN: [id number]   Date of Service  10/19/2022  HPI/Events of Note  77 yr old female patient with dementia, DNR/DNI, HTN, DM-2, dyslipidemia, GERD, CKD-3, Fe def anemia, morbid obesity, depression, D-CHF, chronic hypoxic resp failure (on 4 L [place]), PH, and hx of PE on Eliquis, DNR/DNI in ICU for  1). Septic shock, source GU-UTI. 2) AKI on CKD, hyperkalemia. AGMA. 3) type 2 rise in troponin/HTN. 4) morbid obesity/? COPD. Encephalopathy-CTH neg.   Data: Reviewed LA trending down to 11.6 from 14.   eICU Interventions  Discussed with CCM, about possibility of ruling out any obstructive uropathy. CT renal study ordered. If has obstructive stone/hydronephrosis, will call Urology service.  Still in ED Follow potassium level. Troponin started to trend down.  Code: DNR.      Intervention Category Major Interventions: Sepsis - evaluation and management;Hypotension - evaluation and management Evaluation Type: New Patient Evaluation  Ranee Gosselin 10/19/2022, 12:16 AM  02:04 AM Camera to room. Just arrived from ED. On levo, VS stable. On nasal o2. Nothing needed at this time. Had CT renal done from ED.  Troponin trending down Follow CT renal reports. Viewed images, did not see any severe hydronephrosis.   04:44 AM  Improving hyperkalemia and renal failure. Follow BMP at 8 AM again. CT renal report reviewed. No acute findings.

## 2022-10-19 NOTE — Plan of Care (Signed)
  Problem: Clinical Measurements: Goal: Ability to maintain clinical measurements within normal limits will improve Outcome: Progressing Goal: Diagnostic test results will improve Outcome: Progressing   Problem: Elimination: Goal: Will not experience complications related to urinary retention Outcome: Progressing   Problem: Pain Managment: Goal: General experience of comfort will improve Outcome: Progressing   Problem: Safety: Goal: Ability to remain free from injury will improve Outcome: Progressing   Problem: Health Behavior/Discharge Planning: Goal: Ability to manage health-related needs will improve Outcome: Not Progressing   Problem: Clinical Measurements: Goal: Will remain free from infection Outcome: Not Progressing Goal: Respiratory complications will improve Outcome: Not Progressing Goal: Cardiovascular complication will be avoided Outcome: Not Progressing   Problem: Activity: Goal: Risk for activity intolerance will decrease Outcome: Not Progressing   Problem: Nutrition: Goal: Adequate nutrition will be maintained Outcome: Not Progressing   Problem: Elimination: Goal: Will not experience complications related to bowel motility Outcome: Not Progressing   Problem: Skin Integrity: Goal: Risk for impaired skin integrity will decrease Outcome: Not Progressing

## 2022-10-19 NOTE — Progress Notes (Signed)
NAME:  Kristine Todd, MRN:  [id number], DOB:  05-30-1945, LOS: 1 ADMISSION DATE:  10/18/2022, CONSULTATION DATE:  10/18/2022 REFERRING MD:  ED MD, CHIEF COMPLAINT:  hyperkalemia and AKI  History of Present Illness:  A 77 yr old female patient with dementia, DNR/DNI, HTN, DM-2, dyslipidemia, GERD, CKD-3, Fe def anemia, morbid obesity, depression, D-CHF, chronic hypoxic resp failure (on 4 L [place]), PH, and hx of PE on Eliquis, who came from NH with abnormal labs, hypotension, and wheezing. Possible hx of COPD. In ED, she was given 0.9 L NS0.9% bolus and started Levophed. Given Cefepime, Vanco, Flagyl and IV Ca after obtaining one set of BC due to difficult stick. Given steroids IV, Duoneb, and Lasix for hyperkalemia. Hypoglycemic and started on D10. On 4 L [place] SpO2 98%.    Pertinent  Medical History  Dementia, DNR/DNI, HTN, DM-2, dyslipidemia, GERD, CKD-3, Fe def anemia, morbid obesity, depression, D-CHF, chronic hypoxic resp failure (on 4 L [place]), PH, hx of PE on Eliquis. Possible hx of COPD.  Significant Hospital Events: Including procedures, antibiotic start and stop dates in addition to other pertinent events   Blood cultures 10/11 >>  UA 10/11 > large LE, many bacteria, yeast, mucus CT abdomen/renal 10/11 > no renal calculus or obstructive uropathy bilaterally, Foley in place, some groundglass basilar opacities bilaterally question air trapping  Interim History / Subjective:  Norepinephrine 9 I/O+ 728 cc total, urine output 450 cc SCr 5.40 > 4.51; potassium 6.2 > 5.8 Lactic acid 10/11 > 11.6 Troponin 261 > 134 CBGs have been elevated overnight 287-283   Objective   Blood pressure 133/73, pulse 86, temperature (!) 97.4 F (36.3 C), temperature source Axillary, resp. rate 11, height 5\' 1"  (1.549 m), weight 88.1 kg, SpO2 (!) 85%.        Intake/Output Summary (Last 24 hours) at 10/19/2022 1013 Last data filed at 10/19/2022 2841 Gross per 24 hour  Intake 1177.95 ml  Output 450 ml  Net  727.95 ml   Filed Weights   10/18/22 1853 10/19/22 0204  Weight: 100 kg 88.1 kg    Examination: General: Elderly woman sleeping, comfortable, no distress HENT: Oropharynx moist Lungs: Clear bilaterally, no crackles or wheezes Cardiovascular: Distant, regular, no murmur Abdomen: Nondistended, positive bowel sounds Extremities: Trace pretibial edema Neuro: Lethargic, does wake to voice.  Weak to moderate cough strength.  She is able to answer questions Skin: Stage II decubitus sore documented by [facility] Problem list     Assessment & Plan:  Septic shock: Source appears to be UTI based on UA -Continue ceftriaxone and modify based on available culture data -Volume resuscitation -Wean norepinephrine as able.  Would place ceiling on this at 10 and avoid CVC placement, invasive procedures -Follow lactic acid for clearance  Acute on chronic renal failure Hyperkalemia Anion gap metabolic acidosis, lactic acidosis -Follow BMP and urine output -Lokelma now -Patient is a poor candidate for HD.  I have spoken with her son Kristine Todd at bedside.  He is a dialysis tech and understands the issues and barriers to HD in his mother.  He is clear that he would not want that intervention for her  Type II non-ST elevation MI due to hypoperfusion, stress -Troponin has peaked -Continue to follow.  No plans to consult cardiology  Hypertension with HFpEF -Antihypertension regimen on hold: Lasix, lisinopril  Diabetes mellitus type 2 -She has had peers of hyperglycemia, increase her sliding scale insulin to moderate scale every 4 hours  History  of pulmonary embolism on Eliquis -Continue home Eliquis  Chronic hypoxemic respiratory failure Suspected COPD -No evidence of wheezing currently -Albuterol available as needed -Wean oxygen as able, currently 6 L/min  Secondary pulmonary hypertension in the setting of hypertension and diastolic dysfunction, presumed COPD, prior pulmonary  emboli -Treat underlying causes.  Not a candidate for vasodilators or ERA  Dementia -Supportive care -Hold home mirtazapine for now  Goals of care -Good discussion with patient's son Kristine Todd at bedside.  We touched on the acute events, acute issues and her current level of support.  Also discussed her overall recent history, several similar hospitalizations, progressive renal dysfunction.  He is agreeable to antibiotics, aggressive medical care including pressors in order to stabilize her from acute illness.  He understands that given her renal failure that she may not stabilize, may decline and die during this hospitalization.  Further we discussed and he understands the fact that the overall clinical picture over the last several months is 1 of decline and that his mother probably does not have much longer to live given her constellation of medical problems, recurrent hospitalizations for UTI, septic shock, renal failure.  DNR status is established.  He would not want hemodialysis for her.  We talked some about the possibility of a transition to hospice and avoiding rehospitalization should she survive this acute illness.  He is going to talk to family about this    Best Practice (right click and "Reselect all SmartList Selections" daily)   Diet/type: NPO DVT prophylaxis: SCD GI prophylaxis: H2B Lines: N/A Foley:  Yes, and it is still needed Code Status:  DNR Last date of multidisciplinary goals of care discussion [discussed with patient's son Kristine Todd at bedside on 10/12.  DNR.  Would not want HD.  Would consider transition to hospice even if she survives this acute illness]  Labs   CBC: Recent Labs  Lab 10/18/22 1920 10/18/22 2024 10/19/22 0252  WBC 11.5*  --  11.5*  NEUTROABS 9.5*  --   --   HGB 11.0* 13.3 10.6*  HCT 39.5 39.0 37.3  MCV 91.0  --  90.3  PLT 298  --  300    Basic Metabolic Panel: Recent Labs  Lab 10/18/22 1920 10/18/22 2024 10/18/22 2205 10/19/22 0252  10/19/22 0641  NA 141 138  --  138 137  K 6.2* 6.2*  --  5.6* 5.8*  CL 94* 101  --  96* 95*  CO2 20*  --   --  17* 18*  GLUCOSE 63* 55*  --  278* 310*  BUN 73* 75*  --  74* 75*  CREATININE 4.87* 5.40*  --  4.53* 4.51*  CALCIUM 9.2  --   --  8.8* 8.7*  MG 3.0*  --   --  2.7*  --   PHOS  --   --  7.9* 7.2*  --    GFR: Estimated Creatinine Clearance: 10.7 mL/min (A) (by C-G formula based on SCr of 4.51 mg/dL (H)). Recent Labs  Lab 10/18/22 1920 10/18/22 2026 10/18/22 2143 10/19/22 0252  WBC 11.5*  --   --  11.5*  LATICACIDVEN  --  14.3* 11.6*  --     Liver Function Tests: Recent Labs  Lab 10/18/22 1920  AST 33  ALT 22  ALKPHOS 63  BILITOT 0.7  PROT 7.1  ALBUMIN 3.4*   No results for input(s): "LIPASE", "AMYLASE" in the last 168 hours. No results for input(s): "AMMONIA" in the last 168 hours.  ABG  Component Value Date/Time   HCO3 21.4 10/18/2022 1920   TCO2 22 10/18/2022 2024   ACIDBASEDEF 6.3 (H) 10/18/2022 1920   O2SAT 32.3 10/18/2022 1920     Coagulation Profile: Recent Labs  Lab 10/18/22 1920  INR 1.7*    Cardiac Enzymes: No results for input(s): "CKTOTAL", "CKMB", "CKMBINDEX", "TROPONINI" in the last 168 hours.  HbA1C: Hemoglobin A1C  Date/Time Value Ref Range Status  01/27/2020 12:00 AM 8.2  Final  11/01/2019 12:00 AM 9.5  Final   Hgb A1c MFr Bld  Date/Time Value Ref Range Status  10/18/2022 07:20 PM 6.1 (H) 4.8 - 5.6 % Final    Comment:    (NOTE) Pre diabetes:          5.7%-6.4%  Diabetes:              >6.4%  Glycemic control for   <7.0% adults with diabetes     CBG: Recent Labs  Lab 10/18/22 2124 10/19/22 0334 10/19/22 0800  GLUCAP 120* 287* 283*     Critical care time: 40 min        Levy Pupa, MD, PhD 10/19/2022, 10:13 AM [facility] [phone] or if no answer before 7:00PM call [phone] For any issues after 7:00PM please call eLink [phone]

## 2022-10-19 NOTE — TOC Progression Note (Signed)
Transition of Care [facility]) - Progression Note    Patient Details  Name: Kristine Todd MRN: [id number] Date of Birth: March 28, 1945  Transition [facility]) CM/SW Contact  Adrian Prows, RN Phone Number: 10/19/2022, 3:54 PM  Clinical Narrative:    TOC for d/c planning; Pt from [facility]; pt disoriented; LVM for pt's son Laney Potash [phone]); unable to discuss d/c plan; spoke w/ Angelique Blonder at facility; she says pt is long-term resident, and can return at d/c; d/c summary will be needed at d/c; will pass on to [facility] for completion of initial assessment.        Expected Discharge Plan and Services                                               Social Determinants of Health (SDOH) Interventions SDOH Screenings   Food Insecurity: No Food Insecurity (10/19/2022)  Housing: Low Risk  (10/19/2022)  Transportation Needs: No Transportation Needs (10/19/2022)  Utilities: Not At Risk (10/19/2022)  Depression (PHQ2-9): Low Risk  (11/02/2018)  Financial Resource Strain: Low Risk  (11/21/2017)  Physical Activity: Inactive (11/21/2017)  Social Connections: Moderately Isolated (11/21/2017)  Stress: No Stress Concern Present (11/21/2017)  Tobacco Use: Medium Risk (10/19/2022)    Readmission Risk Interventions     No data to display

## 2022-10-19 NOTE — Plan of Care (Signed)
  Problem: Clinical Measurements: Goal: Ability to maintain clinical measurements within normal limits will improve Outcome: Progressing   Problem: Education: Goal: Knowledge of General Education information will improve Description: Including pain rating scale, medication(s)/side effects and non-pharmacologic comfort measures Outcome: Not Progressing   Problem: Health Behavior/Discharge Planning: Goal: Ability to manage health-related needs will improve Outcome: Not Progressing   Problem: Clinical Measurements: Goal: Will remain free from infection Outcome: Not Progressing Goal: Diagnostic test results will improve Outcome: Not Progressing Goal: Respiratory complications will improve Outcome: Not Progressing Goal: Cardiovascular complication will be avoided Outcome: Not Progressing

## 2022-10-20 DIAGNOSIS — R579 Shock, unspecified: Secondary | ICD-10-CM | POA: Diagnosis not present

## 2022-10-20 LAB — BASIC METABOLIC PANEL
Anion gap: 14 (ref 5–15)
BUN: 76 mg/dL — ABNORMAL HIGH (ref 8–23)
CO2: 29 mmol/L (ref 22–32)
Calcium: 8.9 mg/dL (ref 8.9–10.3)
Chloride: 97 mmol/L — ABNORMAL LOW (ref 98–111)
Creatinine, Ser: 3.4 mg/dL — ABNORMAL HIGH (ref 0.44–1.00)
GFR, Estimated: 13 mL/min — ABNORMAL LOW (ref >60)
Glucose, Bld: 117 mg/dL — ABNORMAL HIGH (ref 70–99)
Potassium: 4.6 mmol/L (ref 3.5–5.1)
Sodium: 140 mmol/L (ref 135–145)

## 2022-10-20 LAB — GLUCOSE, CAPILLARY
Glucose-Capillary: 101 mg/dL — ABNORMAL HIGH (ref 70–99)
Glucose-Capillary: 108 mg/dL — ABNORMAL HIGH (ref 70–99)
Glucose-Capillary: 110 mg/dL — ABNORMAL HIGH (ref 70–99)
Glucose-Capillary: 116 mg/dL — ABNORMAL HIGH (ref 70–99)
Glucose-Capillary: 122 mg/dL — ABNORMAL HIGH (ref 70–99)
Glucose-Capillary: 125 mg/dL — ABNORMAL HIGH (ref 70–99)

## 2022-10-20 LAB — CALCIUM, IONIZED: Calcium, Ionized, Serum: 4.5 mg/dL (ref 4.5–5.6)

## 2022-10-20 LAB — CBC
HCT: 33.8 % — ABNORMAL LOW (ref 36.0–46.0)
Hemoglobin: 9.9 g/dL — ABNORMAL LOW (ref 12.0–15.0)
MCH: 25.4 pg — ABNORMAL LOW (ref 26.0–34.0)
MCHC: 29.3 g/dL — ABNORMAL LOW (ref 30.0–36.0)
MCV: 86.7 fL (ref 80.0–100.0)
Platelets: 257 10*3/uL (ref 150–400)
RBC: 3.9 MIL/uL (ref 3.87–5.11)
RDW: 19.7 % — ABNORMAL HIGH (ref 11.5–15.5)
WBC: 9.8 10*3/uL (ref 4.0–10.5)
nRBC: 0 % (ref 0.0–0.2)

## 2022-10-20 LAB — PHOSPHORUS: Phosphorus: 4.9 mg/dL — ABNORMAL HIGH (ref 2.5–4.6)

## 2022-10-20 LAB — MAGNESIUM: Magnesium: 2.5 mg/dL — ABNORMAL HIGH (ref 1.7–2.4)

## 2022-10-20 MED ORDER — CHLORHEXIDINE GLUCONATE CLOTH 2 % EX PADS
6.0000 | MEDICATED_PAD | Freq: Every day | CUTANEOUS | Status: DC
  Administered 2022-10-20 – 2022-10-21 (×2): 6 via TOPICAL

## 2022-10-20 MED ORDER — ALBUMIN HUMAN 25 % IV SOLN
12.5000 g | Freq: Once | INTRAVENOUS | Status: AC
  Administered 2022-10-20: 12.5 g via INTRAVENOUS
  Filled 2022-10-20: qty 50

## 2022-10-20 MED ORDER — MEDIHONEY WOUND/BURN DRESSING EX PSTE
1.0000 | PASTE | Freq: Every day | CUTANEOUS | Status: DC
  Administered 2022-10-21: 1 via TOPICAL
  Filled 2022-10-20: qty 44

## 2022-10-20 NOTE — Plan of Care (Signed)
  Problem: Clinical Measurements: Goal: Ability to maintain clinical measurements within normal limits will improve Outcome: Progressing Goal: Diagnostic test results will improve Outcome: Progressing Goal: Respiratory complications will improve Outcome: Progressing   Problem: Coping: Goal: Level of anxiety will decrease Outcome: Progressing   Problem: Elimination: Goal: Will not experience complications related to urinary retention Outcome: Progressing   Problem: Pain Managment: Goal: General experience of comfort will improve Outcome: Progressing   Problem: Safety: Goal: Ability to remain free from injury will improve Outcome: Progressing   Problem: Education: Goal: Knowledge of General Education information will improve Description: Including pain rating scale, medication(s)/side effects and non-pharmacologic comfort measures Outcome: Not Progressing   Problem: Health Behavior/Discharge Planning: Goal: Ability to manage health-related needs will improve Outcome: Not Progressing   Problem: Clinical Measurements: Goal: Cardiovascular complication will be avoided Outcome: Not Progressing   Problem: Activity: Goal: Risk for activity intolerance will decrease Outcome: Not Progressing   Problem: Nutrition: Goal: Adequate nutrition will be maintained Outcome: Not Progressing   Problem: Elimination: Goal: Will not experience complications related to bowel motility Outcome: Not Progressing   Problem: Skin Integrity: Goal: Risk for impaired skin integrity will decrease Outcome: Not Progressing   Problem: Education: Goal: Knowledge of General Education information will improve Description: Including pain rating scale, medication(s)/side effects and non-pharmacologic comfort measures Outcome: Not Progressing   Problem: Health Behavior/Discharge Planning: Goal: Ability to manage health-related needs will improve Outcome: Not Progressing   Problem: Clinical  Measurements: Goal: Cardiovascular complication will be avoided Outcome: Not Progressing   Problem: Activity: Goal: Risk for activity intolerance will decrease Outcome: Not Progressing   Problem: Nutrition: Goal: Adequate nutrition will be maintained Outcome: Not Progressing   Problem: Elimination: Goal: Will not experience complications related to bowel motility Outcome: Not Progressing   Problem: Skin Integrity: Goal: Risk for impaired skin integrity will decrease Outcome: Not Progressing

## 2022-10-20 NOTE — Plan of Care (Signed)
  Problem: Clinical Measurements: Goal: Ability to maintain clinical measurements within normal limits will improve Outcome: Progressing Goal: Will remain free from infection Outcome: Progressing Goal: Diagnostic test results will improve Outcome: Progressing Goal: Respiratory complications will improve Outcome: Progressing Goal: Cardiovascular complication will be avoided Outcome: Progressing   Problem: Coping: Goal: Level of anxiety will decrease Outcome: Progressing   Problem: Elimination: Goal: Will not experience complications related to urinary retention Outcome: Progressing   Problem: Pain Managment: Goal: General experience of comfort will improve Outcome: Progressing   Problem: Safety: Goal: Ability to remain free from injury will improve Outcome: Progressing

## 2022-10-20 NOTE — Consult Note (Signed)
WOC Nurse Consult Note: Reason for Consult: Stage 2 on sacrum Wound type: Large Unstageable Pressure Injury: sacrum Stage 2 Pressure Injury: right ischium Stage 2 Pressure Injury: left ischium  Pressure Injury POA: Yes Measurement: see nursing flow sheets Wound bed: sacrum; dark; necrotic central area; eschar with surrounding partial thickness skin loss Right ischium: partial thickness skin loss; 100% pink Left ischium: partial thickness skin loss: 100% pink  Drainage (amount, consistency, odor) serosanguinous  Periwound: intact  Cleanse sacral wound with Vashe Hart [place] # [phone]), pat dry. Apply Apply 1/4" thick layer of leptospermum honey to wound bed, top with saline moist gauze, top with silicone foam,change daily. Ok to lift silicone foam to reapply Medihoney daily.    Silicone foam dressings to the bilateral ischial wounds, change every 3 days. ASSESS UNDER dressings each shift for any acute changes in the wounds.    Low air loss mattress in place for moisture management and pressure redistribution Will need to order air mattress if patient transfers from the ICU  Discussed POC with patient and bedside nurse.  Re consult if needed, will not follow at this time. Thanks  Arch [employer], RN,CWOCN, CNS, CWON-AP [phone])

## 2022-10-20 NOTE — Progress Notes (Addendum)
NAME:  Kristine Todd, MRN:  [id number], DOB:  02/15/1945, LOS: 2 ADMISSION DATE:  10/18/2022, CONSULTATION DATE:  10/18/2022 REFERRING MD:  ED MD, CHIEF COMPLAINT:  hyperkalemia and AKI  History of Present Illness:  A 77 yr old female patient with dementia, DNR/DNI, HTN, DM-2, dyslipidemia, GERD, CKD-3, Fe def anemia, morbid obesity, depression, D-CHF, chronic hypoxic resp failure (on 4 L [place]), PH, and hx of PE on Eliquis, who came from NH with abnormal labs, hypotension, and wheezing. Possible hx of COPD. In ED, she was given 0.9 L NS0.9% bolus and started Levophed. Given Cefepime, Vanco, Flagyl and IV Ca after obtaining one set of BC due to difficult stick. Given steroids IV, Duoneb, and Lasix for hyperkalemia. Hypoglycemic and started on D10. On 4 L [place] SpO2 98%.    Pertinent  Medical History  Dementia, DNR/DNI, HTN, DM-2, dyslipidemia, GERD, CKD-3, Fe def anemia, morbid obesity, depression, D-CHF, chronic hypoxic resp failure (on 4 L [place]), PH, hx of PE on Eliquis. Possible hx of COPD.  Significant Hospital Events: Including procedures, antibiotic start and stop dates in addition to other pertinent events   Blood cultures 10/11 >>  UA 10/11 > large LE, many bacteria, yeast, mucus CT abdomen/renal 10/11 > no renal calculus or obstructive uropathy bilaterally, Foley in place, some groundglass basilar opacities bilaterally question air trapping  Interim History / Subjective:  Norepinephrine 9 > 4 I/O- 352 cc total, urine output 1595 cc last 24 hours SCr 4.51 > 3.40 K+ 5.8> 4.6 3 L/min Blood cultures remain no growth to date   Objective   Blood pressure (!) 96/48, pulse 79, temperature 98.1 F (36.7 C), temperature source Axillary, resp. rate 11, height 5\' 1"  (1.549 m), weight 86.7 kg, SpO2 97%.        Intake/Output Summary (Last 24 hours) at 10/20/2022 0841 Last data filed at 10/20/2022 0547 Gross per 24 hour  Intake 538.89 ml  Output 1595 ml  Net -1056.11 ml   Filed Weights    10/18/22 1853 10/19/22 0204 10/20/22 0500  Weight: 100 kg 88.1 kg 86.7 kg    Examination: General: Comfortable elderly woman laying in bed with O2 in place HENT: Oropharynx moist, strong voice Lungs: No wheezing or crackles Cardiovascular: Distant, regular, no murmur Abdomen: Nondistended with positive bowel sounds Extremities: Trace pretibial edema Neuro: Wakes easily to voice, tracks, answers questions and follows commands Skin: Stage II decubitus sore documented by [facility] Problem list   Anion gap metabolic acidosis, lactic acidosis  Assessment & Plan:  Septic shock: Source appears to be UTI based on UA -Continue ceftriaxone, modify based on any available culture data -Continue to wean norepinephrine, ceiling placed at 10, currently on 4.  Goal is to avoid CVC placement, any invasive procedures based on goals of care -IV fluid resuscitation as she can tolerate.  Consider repeat IV fluid bolus 10/13  Acute on chronic renal failure, improving Hyperkalemia, improving -Continue to follow BMP and urine output.  Both improved -Poor candidate for HD.  Family is well aware of this.  Her son [name] is a dialysis tech and understands the barriers to HD for his mother.  He is very clear that he would not want this intervention for her  Type II non-ST elevation MI due to hypoperfusion, stress -Troponin peaked -Supportive care, continue to follow.  No plans to involve cardiology at this time  Hypertension with HFpEF -Her antihypertension regimen is on hold.  Lasix, lisinopril  Diabetes mellitus type 2 -  Better CBG control with increase in her sliding scale on 10/12  History of pulmonary embolism on Eliquis -Continue home apixaban  Chronic hypoxemic respiratory failure Suspected COPD -Albuterol available as needed -Continue to wean oxygen as able, 6 > 3L/min  Secondary pulmonary hypertension in the setting of hypertension and diastolic dysfunction, presumed COPD,  prior pulmonary emboli -Continue to treat underlying causes.  Not a candidate for vasodilators or ERA  Dementia -Supportive care -Restart home mirtazapine when stable to do so   Goals of care -Good discussions with patient's son [name] at bedside.  We touched on the acute events, acute issues and her current level of support.  Also discussed her overall recent history, several similar hospitalizations, progressive renal dysfunction.  He is agreeable to antibiotics, aggressive medical care including pressors in order to stabilize her from acute illness.  Further we discussed and he understands the fact that the overall clinical picture over the last several months is one of decline and that his mother probably does not have much longer to live given her constellation of medical problems, recurrent hospitalizations for UTI, septic shock, renal failure.  DNR status is established.  He would not want hemodialysis for her.  We talked some about the possibility of a transition to hospice and avoiding rehospitalization should she survive this acute illness.  Discussions with other family members ongoing    Best Practice (right click and "Reselect all SmartList Selections" daily)   Diet/type: NPO DVT prophylaxis: SCD GI prophylaxis: H2B Lines: N/A Foley:  Yes, and it is still needed Code Status:  DNR Last date of multidisciplinary goals of care discussion [discussed with patient's son [name] at bedside on 10/12.  DNR.  Would not want HD.  Would consider transition to hospice even if she survives this acute illness]  Labs   CBC: Recent Labs  Lab 10/18/22 1920 10/18/22 2024 10/19/22 0252 10/20/22 0319  WBC 11.5*  --  11.5* 9.8  NEUTROABS 9.5*  --   --   --   HGB 11.0* 13.3 10.6* 9.9*  HCT 39.5 39.0 37.3 33.8*  MCV 91.0  --  90.3 86.7  PLT 298  --  300 257    Basic Metabolic Panel: Recent Labs  Lab 10/18/22 1920 10/18/22 2024 10/18/22 2205 10/19/22 0252 10/19/22 0641  10/19/22 1158 10/19/22 1551 10/20/22 0319  NA 141   < >  --  138 137 135 138 140  K 6.2*   < >  --  5.6* 5.8* 5.5* 5.2* 4.6  CL 94*   < >  --  96* 95* 94* 96* 97*  CO2 20*  --   --  17* 18* 25 27 29   GLUCOSE 63*   < >  --  278* 310* 288* 182* 117*  BUN 73*   < >  --  74* 75* 76* 74* 76*  CREATININE 4.87*   < >  --  4.53* 4.51* 4.14* 4.05* 3.40*  CALCIUM 9.2  --   --  8.8* 8.7* 8.6* 8.8* 8.9  MG 3.0*  --   --  2.7*  --   --   --  2.5*  PHOS  --   --  7.9* 7.2*  --   --   --  4.9*   < > = values in this interval not displayed.   GFR: Estimated Creatinine Clearance: 14.1 mL/min (A) (by C-G formula based on SCr of 3.4 mg/dL (H)). Recent Labs  Lab 10/18/22 1920 10/18/22 2026 10/18/22 2143 10/19/22 0252  10/20/22 0319  WBC 11.5*  --   --  11.5* 9.8  LATICACIDVEN  --  14.3* 11.6*  --   --     Liver Function Tests: Recent Labs  Lab 10/18/22 1920  AST 33  ALT 22  ALKPHOS 63  BILITOT 0.7  PROT 7.1  ALBUMIN 3.4*   No results for input(s): "LIPASE", "AMYLASE" in the last 168 hours. No results for input(s): "AMMONIA" in the last 168 hours.  ABG    Component Value Date/Time   HCO3 21.4 10/18/2022 1920   TCO2 22 10/18/2022 2024   ACIDBASEDEF 6.3 (H) 10/18/2022 1920   O2SAT 32.3 10/18/2022 1920     Coagulation Profile: Recent Labs  Lab 10/18/22 1920  INR 1.7*    Cardiac Enzymes: No results for input(s): "CKTOTAL", "CKMB", "CKMBINDEX", "TROPONINI" in the last 168 hours.  HbA1C: Hemoglobin A1C  Date/Time Value Ref Range Status  01/27/2020 12:00 AM 8.2  Final  11/01/2019 12:00 AM 9.5  Final   Hgb A1c MFr Bld  Date/Time Value Ref Range Status  10/18/2022 07:20 PM 6.1 (H) 4.8 - 5.6 % Final    Comment:    (NOTE) Pre diabetes:          5.7%-6.4%  Diabetes:              >6.4%  Glycemic control for   <7.0% adults with diabetes     CBG: Recent Labs  Lab 10/19/22 1542 10/19/22 1957 10/20/22 0017 10/20/22 0318 10/20/22 0743  GLUCAP 185* 132* 125* 108* 122*      Critical care time: 31 min        Levy Pupa, MD, PhD 10/20/2022, 8:41 AM [facility] [phone] or if no answer before 7:00PM call [phone] For any issues after 7:00PM please call eLink [phone]

## 2022-10-21 DIAGNOSIS — R579 Shock, unspecified: Secondary | ICD-10-CM | POA: Diagnosis not present

## 2022-10-21 LAB — POCT I-STAT, CHEM 8
BUN: 70 mg/dL — ABNORMAL HIGH (ref 8–23)
Calcium, Ion: 0.99 mmol/L — ABNORMAL LOW (ref 1.15–1.40)
Chloride: 100 mmol/L (ref 98–111)
Creatinine, Ser: 5.3 mg/dL — ABNORMAL HIGH (ref 0.44–1.00)
Glucose, Bld: 59 mg/dL — ABNORMAL LOW (ref 70–99)
HCT: 38 % (ref 36.0–46.0)
Hemoglobin: 12.9 g/dL (ref 12.0–15.0)
Potassium: 6.1 mmol/L — ABNORMAL HIGH (ref 3.5–5.1)
Sodium: 138 mmol/L (ref 135–145)
TCO2: 22 mmol/L (ref 22–32)

## 2022-10-21 LAB — BASIC METABOLIC PANEL
Anion gap: 11 (ref 5–15)
BUN: 57 mg/dL — ABNORMAL HIGH (ref 8–23)
CO2: 29 mmol/L (ref 22–32)
Calcium: 8.7 mg/dL — ABNORMAL LOW (ref 8.9–10.3)
Chloride: 101 mmol/L (ref 98–111)
Creatinine, Ser: 2.26 mg/dL — ABNORMAL HIGH (ref 0.44–1.00)
GFR, Estimated: 22 mL/min — ABNORMAL LOW (ref >60)
Glucose, Bld: 114 mg/dL — ABNORMAL HIGH (ref 70–99)
Potassium: 3.8 mmol/L (ref 3.5–5.1)
Sodium: 141 mmol/L (ref 135–145)

## 2022-10-21 LAB — MAGNESIUM: Magnesium: 2.3 mg/dL (ref 1.7–2.4)

## 2022-10-21 LAB — PHOSPHORUS: Phosphorus: 3.2 mg/dL (ref 2.5–4.6)

## 2022-10-21 LAB — GLUCOSE, CAPILLARY
Glucose-Capillary: 125 mg/dL — ABNORMAL HIGH (ref 70–99)
Glucose-Capillary: 93 mg/dL (ref 70–99)
Glucose-Capillary: 95 mg/dL (ref 70–99)
Glucose-Capillary: 99 mg/dL (ref 70–99)

## 2022-10-21 LAB — CG4 I-STAT (LACTIC ACID): Lactic Acid, Venous: 13.6 mmol/L (ref 0.5–1.9)

## 2022-10-21 MED ORDER — LORAZEPAM 1 MG PO TABS: 1.0000 mg | ORAL_TABLET | Freq: Four times a day (QID) | ORAL | Status: DC | PRN

## 2022-10-21 MED ORDER — OXYCODONE HCL 5 MG PO TABS: 5.0000 mg | ORAL_TABLET | ORAL | Status: DC | PRN

## 2022-10-21 MED ORDER — DIPHENHYDRAMINE HCL 50 MG/ML IJ SOLN: 12.5000 mg | INTRAMUSCULAR | Status: DC | PRN

## 2022-10-21 MED ORDER — BISACODYL 10 MG RE SUPP: 10.0000 mg | Freq: Every day | RECTAL | Status: DC | PRN

## 2022-10-21 MED ORDER — ALBUTEROL SULFATE (2.5 MG/3ML) 0.083% IN NEBU: 2.5000 mg | INHALATION_SOLUTION | RESPIRATORY_TRACT | Status: DC | PRN

## 2022-10-21 MED ORDER — LORAZEPAM 2 MG/ML PO CONC: 1.0000 mg | Freq: Four times a day (QID) | ORAL | Status: DC | PRN

## 2022-10-21 MED ORDER — MIDODRINE HCL 5 MG PO TABS: 5.0000 mg | ORAL_TABLET | Freq: Three times a day (TID) | ORAL | Status: DC

## 2022-10-21 MED ORDER — HYDROMORPHONE HCL 1 MG/ML IJ SOLN: 0.5000 mg | INTRAMUSCULAR | Status: DC | PRN

## 2022-10-21 MED ORDER — MIDODRINE HCL 5 MG PO TABS
5.0000 mg | ORAL_TABLET | Freq: Three times a day (TID) | ORAL | Status: DC
  Administered 2022-10-21: 5 mg via ORAL
  Filled 2022-10-21: qty 1

## 2022-10-21 MED ORDER — MIRTAZAPINE 15 MG PO TBDP
7.5000 mg | ORAL_TABLET | Freq: Every day | ORAL | Status: DC
  Administered 2022-10-21 – 2022-10-23 (×3): 7.5 mg via ORAL
  Filled 2022-10-21 (×3): qty 0.5

## 2022-10-21 MED ORDER — ACETAMINOPHEN 650 MG RE SUPP: 650.0000 mg | Freq: Four times a day (QID) | RECTAL | Status: DC | PRN

## 2022-10-21 MED ORDER — INSULIN ASPART 100 UNIT/ML IJ SOLN: 0.0000 [IU] | Freq: Three times a day (TID) | INTRAMUSCULAR | Status: DC

## 2022-10-21 MED ORDER — ACETAMINOPHEN 325 MG PO TABS: 650.0000 mg | ORAL_TABLET | Freq: Four times a day (QID) | ORAL | Status: DC | PRN

## 2022-10-21 NOTE — Plan of Care (Signed)
Problem: Education: Goal: Knowledge of General Education information will improve Description: Including pain rating scale, medication(s)/side effects and non-pharmacologic comfort measures Outcome: Not Progressing   Problem: Health Behavior/Discharge Planning: Goal: Ability to manage health-related needs will improve Outcome: Not Progressing

## 2022-10-21 NOTE — TOC Initial Note (Signed)
Transition of Care [facility]) - Initial/Assessment Note   Patient Details  Name: Kristine Todd MRN: [id number] Date of Birth: Oct 09, 1945  Transition of Care [facility]) CM/SW Contact:    Ewing Schlein, LCSW Phone Number: 10/21/2022, 11:17 AM  Clinical Narrative: Patient is a long-term care resident of [facility], which was confirmed with [facility] in admissions. Patient is oriented to self only, so discharge planning will need to be coordinated with patient's son, Laney Potash. CSW left VM for patient's son requesting call back.                Expected Discharge Plan: Long Term Nursing Home Barriers to Discharge: Continued Medical Work up  Patient Goals and CMS Choice Patient states their goals for this hospitalization and ongoing recovery are:: Return to [email or web address] Compare Post Acute Care list provided to:: Patient Represenative (must comment) Choice offered to / list presented to : Adult Children  Expected Discharge Plan and Services In-house Referral: Clinical Social Work Post Acute Care Choice: Skilled Nursing Facility Living arrangements for the past 2 months: Skilled Nursing Facility           DME Arranged: N/A DME Agency: NA  Prior Living Arrangements/Services Living arrangements for the past 2 months: Skilled Nursing Facility Lives with:: Facility Resident Patient language and need for interpreter reviewed:: Yes Do you feel safe going back to the place where you live?: Yes      Need for Family Participation in Patient Care: Yes (Comment) (Patient is oriented to self only.) Care giver support system in place?: Yes (comment) Criminal Activity/Legal Involvement Pertinent to Current Situation/Hospitalization: No - Comment as needed  Activities of Daily Living ADL Screening (condition at time of admission) Independently performs ADLs?: No Does the patient have a NEW difficulty with bathing/dressing/toileting/self-feeding that is expected to last >3 days?: No Does  the patient have a NEW difficulty with getting in/out of bed, walking, or climbing stairs that is expected to last >3 days?: No Does the patient have a NEW difficulty with communication that is expected to last >3 days?: No Is the patient deaf or have difficulty hearing?: Yes Does the patient have difficulty seeing, even when wearing glasses/contacts?: No Does the patient have difficulty concentrating, remembering, or making decisions?: No  Emotional Assessment Orientation: : Oriented to Self Alcohol / Substance Use: Not Applicable Psych Involvement: No (comment)  Admission diagnosis:  Shock (HCC) [R57.9] Patient Active Problem List   Diagnosis Date Noted   Shock (HCC) 10/18/2022   Diabetes mellitus type 2, uncontrolled 12/19/2018   COVID-19 virus infection 07/29/2018   Hyperosmolar hyperglycemic coma due to diabetes mellitus without ketoacidosis (HCC) 02/21/2018   New onset type 2 diabetes mellitus (HCC) 02/21/2018   Hypernatremia 02/21/2018   Hypokalemia 02/21/2018   Decubitus ulcer of coccygeal region, stage 2 (HCC) 02/21/2018   GERD (gastroesophageal reflux disease) 12/21/2017   Declining functional status 08/18/2017   Decreased functional mobility 08/18/2017   CKD (chronic kidney disease) stage 2, GFR 60-89 ml/min 06/02/2017   Arthritis 06/02/2017   GI bleed 02/15/2017   Acute blood loss anemia 02/15/2017   Escherichia coli urinary tract infection 02/15/2017   Respiratory failure, chronic (HCC) 02/15/2017   Edema of upper extremity 02/15/2017   Bilateral lower extremity edema 02/15/2017   Pulmonary embolism (HCC) 02/08/2017   Hypertension 02/08/2017   Polyneuropathy 02/08/2017   Depression 02/08/2017   PCP:  Patient, No Pcp Per Pharmacy:   [facility] - [place], [place] - [address] [place] [address] Phone: [phone] Fax: [phone]  Social Determinants of Health (SDOH) Social History: SDOH Screenings    Food Insecurity: No Food Insecurity (10/19/2022)  Housing: Low Risk  (10/19/2022)  Transportation Needs: No Transportation Needs (10/19/2022)  Utilities: Not At Risk (10/19/2022)  Depression (PHQ2-9): Low Risk  (11/02/2018)  Financial Resource Strain: Low Risk  (11/21/2017)  Physical Activity: Inactive (11/21/2017)  Social Connections: Moderately Isolated (11/21/2017)  Stress: No Stress Concern Present (11/21/2017)  Tobacco Use: Medium Risk (10/19/2022)   SDOH Interventions:    Readmission Risk Interventions    10/21/2022   11:16 AM  Readmission Risk Prevention Plan  Transportation Screening Complete  HRI or Home Care Consult Complete  Social Work Consult for Recovery Care Planning/Counseling Complete  Palliative Care Screening Not Applicable  Medication Review Oceanographer) Complete

## 2022-10-21 NOTE — IPAL (Signed)
  Interdisciplinary Goals of Care Family Meeting   Date carried out: 10/21/2022  Location of the meeting: Conference room  Member's involved: Nurse Practitioner, Bedside Registered Nurse, and Family Member or next of kin  Durable Power of Attorney or acting medical decision maker: Son, [name]  Pt's sister, and two other family members present.   Discussion: We discussed goals of care for [facility] .  Family after yesterday GOC discussions acknowledge progressive decline with frequent hospitalizations since August related to irreversible co-morbidities .  Family do not elect to continue with aggressive medical interventions.  Instead, they wish to focus on remaining quality of life patient remaining and comfort measures only.  At this time, passing not imminent but could change quickly, family aware.  Ongoing emotional support ongoing.     Code status: comfort measures> DNR/DNI  Disposition: pending.  PMT consulted.  TOC following.  Family asking for hospice placement, not to return to [facility].  Comfort measures placed and will transfer to palliative floor.   Time spent for the meeting: 25 mins    Posey Boyer, NP  10/21/2022, 2:06 PM

## 2022-10-21 NOTE — Progress Notes (Signed)
NAME:  Kristine Todd, MRN:  [id number], DOB:  1945/09/13, LOS: 3 ADMISSION DATE:  10/18/2022, CONSULTATION DATE:  10/18/2022 REFERRING MD:  ED MD, CHIEF COMPLAINT:  hyperkalemia and AKI  History of Present Illness:  A 77 yr old female patient with dementia, DNR/DNI, HTN, DM-2, dyslipidemia, GERD, CKD-3, Fe def anemia, morbid obesity, depression, D-CHF, chronic hypoxic resp failure (on 4 L [place]), PH, and hx of PE on Eliquis, who came from NH with abnormal labs, hypotension, and wheezing. Possible hx of COPD. In ED, she was given 0.9 L NS0.9% bolus and started Levophed. Given Cefepime, Vanco, Flagyl and IV Ca after obtaining one set of BC due to difficult stick. Given steroids IV, Duoneb, and Lasix for hyperkalemia. Hypoglycemic and started on D10. On 4 L [place] SpO2 98%.    Pertinent  Medical History  Dementia, DNR/DNI, HTN, DM-2, dyslipidemia, GERD, CKD-3, Fe def anemia, morbid obesity, depression, D-CHF, chronic hypoxic resp failure (on 4 L [place]), PH, hx of PE on Eliquis. Possible hx of COPD.  Significant Hospital Events: Including procedures, antibiotic start and stop dates in addition to other pertinent events   Blood cultures 10/11 >>  UA 10/11 > large LE, many bacteria, yeast, mucus CT abdomen/renal 10/11 > no renal calculus or obstructive uropathy bilaterally, Foley in place, some groundglass basilar opacities bilaterally question air trapping  Interim History / Subjective:  Off pressors overnight No complaints   Objective   Blood pressure 107/71, pulse 84, temperature 98.5 F (36.9 C), temperature source Oral, resp. rate 11, height 5\' 1"  (1.549 m), weight 86.5 kg, SpO2 97%.        Intake/Output Summary (Last 24 hours) at 10/21/2022 1026 Last data filed at 10/21/2022 0700 Gross per 24 hour  Intake 1220.33 ml  Output 925 ml  Net 295.33 ml   Filed Weights   10/19/22 0204 10/20/22 0500 10/21/22 0500  Weight: 88.1 kg 86.7 kg 86.5 kg    Examination: General:  elderly female lying  in bed in NAD HEENT: MM pink/dry, pupils 3/r, arcus senilis Neuro: awakens to verbal, oriented to person, place and year, generalized weakness, contracted in lower extremities CV: rr, no murmur PULM:  non labored, clear, diminished in bases, occ faint exp wheeze GI: obese, +bs, NT/ ND, foley-cyu Extremities: warm/dry, no pitting LE edema  Skin: no rashes, posterior not examined  Afebrile UOP 948ml/24hrs Net even Labs > K 3.8, BUN/ sCr 76/ 3.4> 57/ 2.26,    Resolved Hospital Problem list   Anion gap metabolic acidosis, lactic acidosis  Assessment & Plan:   Septic and hypovolemic shock: Source appears to be UTI based on UA - off pressors, goal MAP > 65 - BC remain ngtd - cont ceftriaxone for now, day 4x, likely stop after 5 days if cultures remain neg - trend fever curve / WBC - transfer out of ICU now stable off pressors    Acute on chronic renal failure, improving Hyperkalemia, resolved - unclear baseline sCr, last 1 in 01/2020, but overall stable UOP and improving renal function - d/c foley and monitor for rentention - not a candidate for HD if progressive - trend renal indices  - strict I/Os, daily wts - avoid nephrotoxins, renal dose meds, hemodynamic support as above  Type II non-ST elevation MI due to hypoperfusion, stress - cont supportive care.  No plans to involve cardiology at this time  Hypertension with HFpEF HLD - cont to hold lasix and lisinopril> BP normotensive off pressors - resume crestor  Diabetes  mellitus type 2 - change to AC/ HS  History of pulmonary embolism on Eliquis - cont eliquis  Chronic hypoxemic respiratory failure Suspected COPD - cont supplemental O2, baseline HOT 4L - prn albuterol, not on home  - avoid sedating meds   Secondary pulmonary hypertension in the setting of hypertension and diastolic dysfunction, presumed COPD, prior pulmonary emboli, ?OSA -  Not a candidate for vasodilators or ERA, cont supportive care -  supplemental O2 as above.    Dementia Dysphagia - cont supportive care - Restart pta mirtazapine  - cont dysphagia diet and aspiration precautions  Stage II decubitus - continue supportive/ wound care w/ medihoney, maximize nutrition as able  Chronic normocytic anemia - trend CBC, Hgb down but suspect just hemoconcentrated, no evidence of bleeding, monitor on eliquis  Goals of care - as of, son, [name], agreeable to antibiotics, aggressive medical care including pressors in order to stabilize her from acute illness.  No HD if progressive renal failure.  Changed to DNR/ DNI 10/11.  - ongoing    Best Practice (right click and "Reselect all SmartList Selections" daily)   Diet/type: dysphagia diet (see orders) 3 DVT prophylaxis: SCD GI prophylaxis: H2B Lines: N/A Foley:  Yes, and it is still needed Code Status:  DNR Last date of multidisciplinary goals of care discussion [discussed with patient's son [name] at bedside on 10/12.  DNR.  Would not want HD.  Would consider transition to hospice even if she survives this acute illness]  Pending update 10/14  Labs   CBC: Recent Labs  Lab 10/18/22 1920 10/18/22 1948 10/18/22 2024 10/19/22 0252 10/20/22 0319  WBC 11.5*  --   --  11.5* 9.8  NEUTROABS 9.5*  --   --   --   --   HGB 11.0* 12.9 13.3 10.6* 9.9*  HCT 39.5 38.0 39.0 37.3 33.8*  MCV 91.0  --   --  90.3 86.7  PLT 298  --   --  300 257    Basic Metabolic Panel: Recent Labs  Lab 10/18/22 1920 10/18/22 1948 10/18/22 2205 10/19/22 0252 10/19/22 0641 10/19/22 1158 10/19/22 1551 10/20/22 0319 10/21/22 0251  NA 141   < >  --  138 137 135 138 140 141  K 6.2*   < >  --  5.6* 5.8* 5.5* 5.2* 4.6 3.8  CL 94*   < >  --  96* 95* 94* 96* 97* 101  CO2 20*  --   --  17* 18* 25 27 29 29   GLUCOSE 63*   < >  --  278* 310* 288* 182* 117* 114*  BUN 73*   < >  --  74* 75* 76* 74* 76* 57*  CREATININE 4.87*   < >  --  4.53* 4.51* 4.14* 4.05* 3.40* 2.26*  CALCIUM 9.2  --   --   8.8* 8.7* 8.6* 8.8* 8.9 8.7*  MG 3.0*  --   --  2.7*  --   --   --  2.5* 2.3  PHOS  --   --  7.9* 7.2*  --   --   --  4.9* 3.2   < > = values in this interval not displayed.   GFR: Estimated Creatinine Clearance: 21.2 mL/min (A) (by C-G formula based on SCr of 2.26 mg/dL (H)). Recent Labs  Lab 10/18/22 1920 10/18/22 1950 10/18/22 2026 10/18/22 2143 10/19/22 0252 10/20/22 0319  WBC 11.5*  --   --   --  11.5* 9.8  LATICACIDVEN  --  13.6* 14.3* 11.6*  --   --     Liver Function Tests: Recent Labs  Lab 10/18/22 1920  AST 33  ALT 22  ALKPHOS 63  BILITOT 0.7  PROT 7.1  ALBUMIN 3.4*   No results for input(s): "LIPASE", "AMYLASE" in the last 168 hours. No results for input(s): "AMMONIA" in the last 168 hours.  ABG    Component Value Date/Time   HCO3 21.4 10/18/2022 1920   TCO2 22 10/18/2022 2024   ACIDBASEDEF 6.3 (H) 10/18/2022 1920   O2SAT 32.3 10/18/2022 1920     Coagulation Profile: Recent Labs  Lab 10/18/22 1920  INR 1.7*    Cardiac Enzymes: No results for input(s): "CKTOTAL", "CKMB", "CKMBINDEX", "TROPONINI" in the last 168 hours.  HbA1C: Hemoglobin A1C  Date/Time Value Ref Range Status  01/27/2020 12:00 AM 8.2  Final  11/01/2019 12:00 AM 9.5  Final   Hgb A1c MFr Bld  Date/Time Value Ref Range Status  10/18/2022 07:20 PM 6.1 (H) 4.8 - 5.6 % Final    Comment:    (NOTE) Pre diabetes:          5.7%-6.4%  Diabetes:              >6.4%  Glycemic control for   <7.0% adults with diabetes     CBG: Recent Labs  Lab 10/20/22 1700 10/20/22 1953 10/21/22 0013 10/21/22 0338 10/21/22 0720  GLUCAP 110* 116* 125* 99 95     Critical care time: n/a      Posey Boyer, MSN, AG-ACNP-BC [facility] 10/21/2022, 10:27 AM  See Amion for pager If no response to pager , please call [phone] until 7pm After 7:00 pm call Elink  [phone]

## 2022-10-22 DIAGNOSIS — R579 Shock, unspecified: Secondary | ICD-10-CM | POA: Diagnosis not present

## 2022-10-22 LAB — BLOOD CULTURE ID PANEL (REFLEXED) - BCID2

## 2022-10-22 NOTE — Plan of Care (Signed)
  Problem: Pain Managment: Goal: General experience of comfort will improve Outcome: Progressing   Problem: Safety: Goal: Ability to remain free from injury will improve Outcome: Progressing   

## 2022-10-22 NOTE — Consult Note (Signed)
Consultation Note Date: 10/22/2022   Patient Name: Kristine Todd  DOB: December 28, 1945  MRN: [id number]  Age / Sex: 77 y.o., female  PCP: Patient, No Pcp Per Referring Physician: Dorcas Carrow, MD  Reason for Consultation: Establishing goals of care  HPI/Patient Profile: 77 y.o. female admitted on 10/18/2022    77 year old female with history of dementia, hypertension, type 2 diabetes, bedbound status, chronic hypoxemic respiratory failure on 4 L oxygen, long-term nursing home resident brought from nursing home with abnormal labs, hypotension and, wheezing and short of breath.  In the emergency room he was hypotensive, she was given fluid boluses and started on Levophed.  Started on broad spectrum antibiotics and admitted to ICU.   10/11, admitted to ICU with hypotension on broad-spectrum antibiotics.  Urinalysis was abnormal, cultures were not done.  Blood cultures negative so far.   10/14, converted to comfort care measures and transferred out of ICU. Clinical Assessment and Goals of Care: Palliative consult on ongoing comfort care and ongoing disposition discussions has been requested. Chart reviewed, patient seen, discussed with the son present at bedside.  Bedside nurse also present in the room. Palliative medicine is specialized medical care for people living with serious illness. It focuses on providing relief from the symptoms and stress of a serious illness. The goal is to improve quality of life for both the patient and the family. Goals of care: Broad aims of medical therapy in relation to the patient's values and preferences. Our aim is to provide medical care aimed at enabling patients to achieve the goals that matter most to them, given the circumstances of their particular medical situation and their constraints.    NEXT OF KIN Has son and her daughter.  SUMMARY OF RECOMMENDATIONS   Agree with  DNR Agree with comfort focused care Agree with involvement of hospice services in the outpatient setting Continue current scope of care, continue Foley catheter as a comfort measure  Code Status/Advance Care Planning: DNR   Symptom Management:     Palliative Prophylaxis:  Delirium Protocol  Additional Recommendations (Limitations, Scope, Preferences): Full Comfort Care  Psycho-social/Spiritual:  Desire for further Chaplaincy support:yes Additional Recommendations: Education on Hospice  Prognosis:  < 4 weeks  Discharge Planning: Skilled Nursing Facility with Hospice      Primary Diagnoses: Present on Admission:  Shock (HCC)   I have reviewed the medical record, interviewed the patient and family, and examined the patient. The following aspects are pertinent.  Past Medical History:  Diagnosis Date   Arthritis    Chronic hypoxic respiratory failure, on home oxygen therapy (HCC)    CKD (chronic kidney disease) stage 3, GFR 30-59 ml/min (HCC)    Depression    Diastolic heart failure (HCC)    HLD (hyperlipidemia)    Hypertension    Pulmonary embolism (HCC)    Type 2 diabetes mellitus (HCC)    Social History   Socioeconomic History   Marital status: Widowed    Spouse name: Not on file   Number of children: Not  on file   Years of education: Not on file   Highest education level: Not on file  Occupational History   Not on file  Tobacco Use   Smoking status: Former    Current packs/day: 0.00    Types: Cigarettes    Start date: 04/1961    Quit date: 04/2016    Years since quitting: 6.5   Smokeless tobacco: Never  Vaping Use   Vaping status: Never Used  Substance and Sexual Activity   Alcohol use: No   Drug use: No   Sexual activity: Never  Other Topics Concern   Not on file  Social History Narrative   Not on file   Social Determinants of Health   Financial Resource Strain: Low Risk  (11/21/2017)   Overall Financial Resource Strain (CARDIA)     Difficulty of Paying Living Expenses: Not hard at all  Food Insecurity: No Food Insecurity (10/19/2022)   Hunger Vital Sign    Worried About Running Out of Food in the Last Year: Never true    Ran Out of Food in the Last Year: Never true  Transportation Needs: No Transportation Needs (10/19/2022)   PRAPARE - Administrator, Civil Service (Medical): No    Lack of Transportation (Non-Medical): No  Physical Activity: Inactive (11/21/2017)   Exercise Vital Sign    Days of Exercise per Week: 0 days    Minutes of Exercise per Session: 0 min  Stress: No Stress Concern Present (11/21/2017)   [employer] of Occupational Health - Occupational Stress Questionnaire    Feeling of Stress : Not at all  Social Connections: Moderately Isolated (11/21/2017)   Social Connection and Isolation Panel [NHANES]    Frequency of Communication with Friends and Family: More than three times a week    Frequency of Social Gatherings with Friends and Family: More than three times a week    Attends Religious Services: Never    Database administrator or Organizations: No    Attends Banker Meetings: Never    Marital Status: Widowed   Family History  Problem Relation Age of Onset   Cancer Mother    Hypertension Mother    Diabetes Mother    Hypertension Father    Cancer Sister    Diabetes Sister    Diabetes Maternal Grandmother    Hypertension Maternal Grandmother    Diabetes Maternal Grandfather    Hypertension Maternal Grandfather    Scheduled Meds:  mirtazapine  7.5 mg Oral QHS   sodium chloride flush  3 mL Intravenous Q12H   Continuous Infusions: PRN Meds:.acetaminophen **OR** acetaminophen, bisacodyl, diphenhydrAMINE, HYDROmorphone (DILAUDID) injection, lip balm, LORazepam **OR** LORazepam **OR** LORazepam, mouth rinse, oxyCODONE Medications Prior to Admission:  Prior to Admission medications   Medication Sig Start Date End Date Taking? Authorizing Provider  albuterol  (PROVENTIL) (2.5 MG/3ML) 0.083% nebulizer solution Take 2.5 mg by nebulization every 4 (four) hours as needed for wheezing.   Yes [provider]  Amino Acids-Protein Hydrolys (PRO-STAT) LIQD Take 30 mLs by mouth in the morning. 10/11/22 11/11/22 Yes [provider]  ARTIFICIAL TEAR SOLUTION OP Place 1 drop into both eyes 3 (three) times daily.   Yes [provider]  aspirin EC 81 MG tablet Take 81 mg by mouth daily.   Yes [provider]  Cholecalciferol (VITAMIN D3) 50 MCG (2000 UT) TABS Take 2,000 Units by mouth in the morning.   Yes [provider]  cycloSPORINE (RESTASIS) 0.05 % ophthalmic  emulsion Place 1 drop into both eyes 2 (two) times daily.   Yes [provider]  ELIQUIS 5 MG TABS tablet Take 5 mg by mouth 2 (two) times daily.   Yes [provider]  ferrous sulfate 325 (65 FE) MG tablet Take 325 mg by mouth daily with breakfast.   Yes [provider]  fluticasone (FLONASE) 50 MCG/ACT nasal spray Place 2 sprays into both nostrils in the morning.   Yes [provider]  furosemide (LASIX) 20 MG tablet Take 20 mg by mouth 2 (two) times daily.   Yes [provider]  gabapentin (NEURONTIN) 300 MG capsule Take 300 mg by mouth 3 (three) times daily.   Yes [provider]  lisinopril (ZESTRIL) 2.5 MG tablet Take 2.5 mg by mouth daily.   Yes [provider]  loratadine (CLARITIN) 10 MG tablet Take 10 mg by mouth daily.    Yes [provider]  magnesium oxide (MAG-OX) 400 (240 Mg) MG tablet Take 400 mg by mouth 2 (two) times daily.   Yes [provider]  metFORMIN (GLUCOPHAGE) 1000 MG tablet Take 1,000 mg by mouth 2 (two) times daily with a meal.   Yes [provider]  metoprolol tartrate (LOPRESSOR) 25 MG tablet Take 12.5 mg by mouth 2 (two) times daily.   Yes [provider]  mirtazapine (REMERON) 15 MG tablet Take 7.5 mg by mouth at bedtime.   Yes [provider]  NON FORMULARY Take 120 mLs by mouth See admin instructions. MED PASS- Drink 120 ml's by mouth at 8 AM and 4 PM   Yes [provider]  OXYGEN Inhale 4 L/min into the lungs continuous.   Yes [provider]  pantoprazole (PROTONIX) 40 MG tablet Take 40 mg by mouth daily.   Yes [provider]  rosuvastatin (CRESTOR) 5 MG tablet Take 5 mg by mouth at bedtime.   Yes [provider]  Sodium Fluoride (PREVIDENT 5000 BOOSTER DT) Place 1 application  onto teeth See admin instructions. Brush 1 application onto the teeth and brush every evening   Yes [provider]   No Known Allergies Review of Systems +weakness  Physical Exam Appears with generalized weakness Appears with no acute distress Regular work of breathing Requires assistance with feeding  Vital Signs: BP 131/75 (BP Location: Right Arm)   Pulse (!) 102   Temp 98.3 F (36.8 C) (Oral)   Resp 18   Ht 5\' 1"  (1.549 m)   Wt 86.5 kg   SpO2 94%   BMI 36.03 kg/m  Pain Scale: PAINAD POSS *See Group Information*: S-Acceptable,Sleep, easy to arouse Pain Score: 0-No pain   SpO2: SpO2: 94 % O2 Device:SpO2: 94 % O2 Flow Rate: .O2 Flow Rate (L/min): 3 L/min  IO: Intake/output summary:  Intake/Output Summary (Last 24 hours) at 10/22/2022 1555 Last data filed at 10/22/2022 1400 Gross per 24 hour  Intake 990 ml  Output 500 ml  Net 490 ml    LBM: Last BM Date : 10/21/22 Baseline Weight: Weight: 100 kg Most recent weight: Weight: 86.5 kg     Palliative Assessment/Data:   PPS 30%  Time In:  1430 Time Out: 1530  Time Total:  60  Greater than 50%  of this time was spent counseling and coordinating care related to the above assessment and plan.  Signed by: Rosalin Hawking, MD   Please contact Palliative Medicine Team phone at [phone] for questions and concerns.  For individual provider: See  Amion

## 2022-10-22 NOTE — Progress Notes (Signed)
PROGRESS NOTE    Kristine Todd  WUJ:[id number] DOB: 03/27/1945 DOA: 10/18/2022 PCP: Patient, No Pcp Per    Brief Narrative:    77 year old female with history of dementia, hypertension, type 2 diabetes, bedbound status, chronic hypoxemic respiratory failure on 4 L oxygen, long-term nursing home resident brought from nursing home with abnormal labs, hypotension and, wheezing and short of breath.  In the emergency room he was hypotensive, she was given fluid boluses and started on Levophed.  Started on broad spectrum antibiotics and admitted to ICU.   10/11, admitted to ICU with hypotension on broad-spectrum antibiotics.  Urinalysis was abnormal, cultures were not done.  Blood cultures negative so far.   10/14, converted to comfort care measures and transferred out of ICU. Disposition pending      Assessment & Plan:   Shock, multifactorial.  Septic and hypovolemic shock.  UA was abnormal.  Blood cultures are negative so far.  Urine culture was not done.  She was transiently on vasopressors and now off. Blood pressures are maintaining. She completed 4 days of IV ceftriaxone.  Monitor.  Patient has a Foley catheter, will keep as comfort measures.  Acute on chronic renal failure, hyperkalemia: Presented with creatinine of 4.87-peaked at 4.5 and down to 2.26 on 10/14.  Encourage oral intake.  See goal of care discussion below.  Hypertension with heart failure with preserved ejection fraction, hyperlipidemia: Holding all antihypertensives.  Failure to thrive COPD and chronic hypoxemia on 4 L oxygen Type 2 diabetes, History of pulmonary embolism on Eliquis Dementia and dysphagia  Patient with advanced medical disease.  Currently stabilized.  Goal of care discussion with intensivist 10/14, documented in the chart and patient was started on comfort care measures.  All comfort care medications available. Discussion about hospice on discharge. Family were interested to go to inpatient  hospice.  Palliative care to coordinate.  She is a long-term resident at [facility], may involve hospice at [facility].  Called and discussed with patient's son, he is overwhelmed today.  He will discuss with his other family regarding the disposition plan.  Will monitor patient today.  Remains comfort measures.   DVT prophylaxis: Comfort measures   Code Status: Comfort measures Family Communication: Patient's son on the phone Disposition Plan: Status is: Inpatient Remains inpatient appropriate because: Safe disposition pending     Consultants:  Critical care Palliative care  Procedures:  None  Antimicrobials:  Completed 4 days of ceftriaxone   Subjective: Patient seen in the morning rounds.  Poor historian.  She was alert and awake.  She answers few basic questions.  She told me she is fine.  No other overnight events noted.  Blood pressures fluctuate but mostly remained stable.  She has a Foley catheter in place with adequate urine.  Afebrile.  Objective: Vitals:   10/21/22 1245 10/21/22 1300 10/21/22 1534 10/22/22 0517  BP: (!) 95/54 (!) 122/40 97/81 131/75  Pulse: 95 95 92 (!) 102  Resp: 10 13 16 18   Temp:   99.3 F (37.4 C) 98.3 F (36.8 C)  TempSrc:   Oral Oral  SpO2: 96% 93% 96% 94%  Weight:      Height:        Intake/Output Summary (Last 24 hours) at 10/22/2022 1327 Last data filed at 10/22/2022 0520 Gross per 24 hour  Intake 240 ml  Output 500 ml  Net -260 ml   Filed Weights   10/19/22 0204 10/20/22 0500 10/21/22 0500  Weight: 88.1 kg 86.7 kg  86.5 kg    Examination:  General: Chronically sick looking.  Frail debilitated.  Clinically dry. Alert and awake but not oriented.  Able to answer few questions.  Generalized weakness.  Lower extremities are more weaker with deformities at her ankles. Cardiovascular: S1-2 normal. Respiratory: Bilateral clear.  Poor inspiratory effort. SpO2: 94 % O2 Flow Rate (L/min): 3 L/min  Gastrointestinal: Soft.   Pendulous.  Nontender.     Data Reviewed: I have personally reviewed following labs and imaging studies  CBC: Recent Labs  Lab 10/18/22 1920 10/18/22 1948 10/18/22 2024 10/19/22 0252 10/20/22 0319  WBC 11.5*  --   --  11.5* 9.8  NEUTROABS 9.5*  --   --   --   --   HGB 11.0* 12.9 13.3 10.6* 9.9*  HCT 39.5 38.0 39.0 37.3 33.8*  MCV 91.0  --   --  90.3 86.7  PLT 298  --   --  300 257   Basic Metabolic Panel: Recent Labs  Lab 10/18/22 1920 10/18/22 1948 10/18/22 2205 10/19/22 0252 10/19/22 0641 10/19/22 1158 10/19/22 1551 10/20/22 0319 10/21/22 0251  NA 141   < >  --  138 137 135 138 140 141  K 6.2*   < >  --  5.6* 5.8* 5.5* 5.2* 4.6 3.8  CL 94*   < >  --  96* 95* 94* 96* 97* 101  CO2 20*  --   --  17* 18* 25 27 29 29   GLUCOSE 63*   < >  --  278* 310* 288* 182* 117* 114*  BUN 73*   < >  --  74* 75* 76* 74* 76* 57*  CREATININE 4.87*   < >  --  4.53* 4.51* 4.14* 4.05* 3.40* 2.26*  CALCIUM 9.2  --   --  8.8* 8.7* 8.6* 8.8* 8.9 8.7*  MG 3.0*  --   --  2.7*  --   --   --  2.5* 2.3  PHOS  --   --  7.9* 7.2*  --   --   --  4.9* 3.2   < > = values in this interval not displayed.   GFR: Estimated Creatinine Clearance: 21.2 mL/min (A) (by C-G formula based on SCr of 2.26 mg/dL (H)). Liver Function Tests: Recent Labs  Lab 10/18/22 1920  AST 33  ALT 22  ALKPHOS 63  BILITOT 0.7  PROT 7.1  ALBUMIN 3.4*   No results for input(s): "LIPASE", "AMYLASE" in the last 168 hours. No results for input(s): "AMMONIA" in the last 168 hours. Coagulation Profile: Recent Labs  Lab 10/18/22 1920  INR 1.7*   Cardiac Enzymes: No results for input(s): "CKTOTAL", "CKMB", "CKMBINDEX", "TROPONINI" in the last 168 hours. BNP (last 3 results) No results for input(s): "PROBNP" in the last 8760 hours. HbA1C: No results for input(s): "HGBA1C" in the last 72 hours. CBG: Recent Labs  Lab 10/20/22 1953 10/21/22 0013 10/21/22 0338 10/21/22 0720 10/21/22 1143  GLUCAP 116* 125* 99 95 93    Lipid Profile: No results for input(s): "CHOL", "HDL", "LDLCALC", "TRIG", "CHOLHDL", "LDLDIRECT" in the last 72 hours. Thyroid Function Tests: No results for input(s): "TSH", "T4TOTAL", "FREET4", "T3FREE", "THYROIDAB" in the last 72 hours. Anemia Panel: No results for input(s): "VITAMINB12", "FOLATE", "FERRITIN", "TIBC", "IRON", "RETICCTPCT" in the last 72 hours. Sepsis Labs: Recent Labs  Lab 10/18/22 1950 10/18/22 2026 10/18/22 2143  LATICACIDVEN 13.6* 14.3* 11.6*    Recent Results (from the past 240 hour(s))  Blood culture (routine x 2)  Status: None (Preliminary result)   Collection Time: 10/18/22  7:23 PM   Specimen: BLOOD  Result Value Ref Range Status   Specimen Description   Final    BLOOD BLOOD RIGHT FOREARM Performed at [facility], [address], [place], [place] [address]    Special Requests   Final    BOTTLES DRAWN AEROBIC AND ANAEROBIC Blood Culture results may not be optimal due to an inadequate volume of blood received in culture bottles Performed at [facility], [address], [place], [place] [address]    Culture   Final    NO GROWTH 4 DAYS Performed at [facility], [address], [place], [place] [address]    Report Status PENDING  Incomplete  SARS Coronavirus 2 by RT PCR (hospital order, performed in [facility] hospital lab) *cepheid single result test* Anterior Nasal Swab     Status: None   Collection Time: 10/18/22  8:01 PM   Specimen: Anterior Nasal Swab  Result Value Ref Range Status   SARS Coronavirus 2 by RT PCR NEGATIVE NEGATIVE Final    Comment: (NOTE) SARS-CoV-2 target nucleic acids are NOT DETECTED.  The SARS-CoV-2 RNA is generally detectable in upper and lower respiratory specimens during the acute phase of infection. The lowest concentration of SARS-CoV-2 viral copies this assay can detect is 250 copies / mL. A negative result does not preclude SARS-CoV-2 infection and should not be  used as the sole basis for treatment or other patient management decisions.  A negative result may occur with improper specimen collection / handling, submission of specimen other than nasopharyngeal swab, presence of viral mutation(s) within the areas targeted by this assay, and inadequate number of viral copies (<250 copies / mL). A negative result must be combined with clinical observations, patient history, and epidemiological information.  Fact Sheet for Patients:   RoadLapTop.co.za  Fact Sheet for Healthcare Providers: http://kim-miller.com/  This test is not yet approved or  cleared by the [place] FDA and has been authorized for detection and/or diagnosis of SARS-CoV-2 by FDA under an Emergency Use Authorization (EUA).  This EUA will remain in effect (meaning this test can be used) for the duration of the COVID-19 declaration under Section 564(b)(1) of the Act, 21 U.S.C. section 360bbb-3(b)(1), unless the authorization is terminated or revoked sooner.  Performed at [facility], [address] [place], [place] [address]   Blood culture (routine x 2)     Status: None (Preliminary result)   Collection Time: 10/19/22  2:52 AM   Specimen: BLOOD RIGHT HAND  Result Value Ref Range Status   Specimen Description   Final    BLOOD RIGHT HAND BOTTLES DRAWN AEROBIC ONLY Performed at [facility], [address] [place], [place] [address]    Special Requests   Final    Blood Culture adequate volume Performed at [facility], [address] [place], [place] [address]    Culture   Final    NO GROWTH 3 DAYS Performed at [facility], [address], [place], [place] [address]    Report Status PENDING  Incomplete  MRSA Next Gen by PCR, Nasal     Status: None   Collection Time: 10/19/22  3:22 AM   Specimen: Nasal Mucosa; Nasal Swab  Result Value Ref Range Status    MRSA by PCR Next Gen NOT DETECTED NOT DETECTED Final    Comment: (NOTE) The GeneXpert MRSA Assay (  FDA approved for NASAL specimens only), is one component of a comprehensive MRSA colonization surveillance program. It is not intended to diagnose MRSA infection nor to guide or monitor treatment for MRSA infections. Test performance is not FDA approved in patients less than 27 years old. Performed at [facility], [address] [place], [place] [address]          Radiology Studies: No results found.      Scheduled Meds:  mirtazapine  7.5 mg Oral QHS   sodium chloride flush  3 mL Intravenous Q12H   Continuous Infusions:   LOS: 4 days    Time spent: 50 minutes    Dorcas Carrow, MD [facility]

## 2022-10-22 NOTE — Progress Notes (Signed)
PHARMACY - PHYSICIAN COMMUNICATION CRITICAL VALUE ALERT - BLOOD CULTURE IDENTIFICATION (BCID)  Kristine Todd is an 77 y.o. female who presented to [facility] on 10/18/2022 with a chief complaint of  Chief Complaint  Patient presents with   Abnormal Labs     Assessment:  abnormal labs, hypotension and, wheezing and short of breath   Name of physician (or Provider) Contacted:  - Dr Jerral Ralph  Current antibiotics:  None   Changes to prescribed antibiotics recommended:  - no plan to start antibiotics, pt has been made comfort care   Results for orders placed or performed during the hospital encounter of 10/18/22  Blood Culture ID Panel (Reflexed) (Collected: 10/18/2022  7:23 PM)  Result Value Ref Range   Enterococcus faecalis NOT DETECTED NOT DETECTED   Enterococcus Faecium NOT DETECTED NOT DETECTED   Listeria monocytogenes NOT DETECTED NOT DETECTED   Staphylococcus species NOT DETECTED NOT DETECTED   Staphylococcus aureus (BCID) NOT DETECTED NOT DETECTED   Staphylococcus epidermidis NOT DETECTED NOT DETECTED   Staphylococcus lugdunensis NOT DETECTED NOT DETECTED   Streptococcus species NOT DETECTED NOT DETECTED   Streptococcus agalactiae NOT DETECTED NOT DETECTED   Streptococcus pneumoniae NOT DETECTED NOT DETECTED   Streptococcus pyogenes NOT DETECTED NOT DETECTED   A.calcoaceticus-baumannii NOT DETECTED NOT DETECTED   Bacteroides fragilis NOT DETECTED NOT DETECTED   Enterobacterales NOT DETECTED NOT DETECTED   Enterobacter cloacae complex NOT DETECTED NOT DETECTED   Escherichia coli NOT DETECTED NOT DETECTED   Klebsiella aerogenes NOT DETECTED NOT DETECTED   Klebsiella oxytoca NOT DETECTED NOT DETECTED   Klebsiella pneumoniae NOT DETECTED NOT DETECTED   Proteus species NOT DETECTED NOT DETECTED   Salmonella species NOT DETECTED NOT DETECTED   Serratia marcescens NOT DETECTED NOT DETECTED   Haemophilus influenzae NOT DETECTED NOT DETECTED   Neisseria meningitidis NOT  DETECTED NOT DETECTED   Pseudomonas aeruginosa NOT DETECTED NOT DETECTED   Stenotrophomonas maltophilia NOT DETECTED NOT DETECTED   Candida albicans NOT DETECTED NOT DETECTED   Candida auris NOT DETECTED NOT DETECTED   Candida glabrata NOT DETECTED NOT DETECTED   Candida krusei NOT DETECTED NOT DETECTED   Candida parapsilosis NOT DETECTED NOT DETECTED   Candida tropicalis NOT DETECTED NOT DETECTED   Cryptococcus neoformans/gattii NOT DETECTED NOT DETECTED     Adalberto Cole, PharmD, BCPS 10/22/2022 6:23 PM

## 2022-10-22 NOTE — TOC Progression Note (Addendum)
Transition of Care [facility]) - Progression Note    Patient Details  Name: Kristine Todd MRN: [id number] Date of Birth: [birth year]  Transition of Care [facility]) CM/SW Contact  Beckie Busing, RN Phone Number:[phone]  10/22/2022, 10:12 AM  Clinical Narrative:    Cm received message from MD with update that patient does not meet inpatient hospice criteria. MD requesting that CM follow up with family for potential return to [facility] with Hospice to follow. CM spoke with son Laney Potash to update on disposition planning. Per [name] he will need to discuss plan with family. Cm has provided son with number. Son will follow up asap.   1115 CM spoke with Lowella Bandy at [facility] to verify that patient is able to return with Hospice services following. Lowella Bandy confirms that patient is from [facility] long term and will be able to return with Hospice following when medically stable.    Expected Discharge Plan: Long Term Nursing Home Barriers to Discharge: Continued Medical Work up  Expected Discharge Plan and Services In-house Referral: Clinical Social Work   Post Acute Care Choice: Skilled Nursing Facility Living arrangements for the past 2 months: Skilled Nursing Facility                 DME Arranged: N/A DME Agency: NA                   Social Determinants of Health (SDOH) Interventions SDOH Screenings   Food Insecurity: No Food Insecurity (10/19/2022)  Housing: Low Risk  (10/19/2022)  Transportation Needs: No Transportation Needs (10/19/2022)  Utilities: Not At Risk (10/19/2022)  Recent Concern: Utilities - Medium Risk (09/04/2022)   Received from [facility]  Depression (PHQ2-9): Low Risk  (11/02/2018)  Financial Resource Strain: Low Risk  (11/21/2017)  Physical Activity: Inactive (11/21/2017)  Social Connections: Moderately Isolated (11/21/2017)  Stress: No Stress Concern Present (11/21/2017)  Tobacco Use: Medium Risk (10/19/2022)    Readmission Risk  Interventions    10/21/2022   11:16 AM  Readmission Risk Prevention Plan  Transportation Screening Complete  HRI or Home Care Consult Complete  Social Work Consult for Recovery Care Planning/Counseling Complete  Palliative Care Screening Not Applicable  Medication Review Oceanographer) Complete

## 2022-10-23 DIAGNOSIS — N179 Acute kidney failure, unspecified: Secondary | ICD-10-CM

## 2022-10-23 DIAGNOSIS — Z7189 Other specified counseling: Secondary | ICD-10-CM

## 2022-10-23 DIAGNOSIS — R579 Shock, unspecified: Secondary | ICD-10-CM | POA: Diagnosis not present

## 2022-10-23 DIAGNOSIS — F0390 Unspecified dementia without behavioral disturbance: Secondary | ICD-10-CM | POA: Diagnosis present

## 2022-10-23 DIAGNOSIS — J9611 Chronic respiratory failure with hypoxia: Secondary | ICD-10-CM | POA: Diagnosis not present

## 2022-10-23 DIAGNOSIS — E119 Type 2 diabetes mellitus without complications: Secondary | ICD-10-CM

## 2022-10-23 DIAGNOSIS — J449 Chronic obstructive pulmonary disease, unspecified: Secondary | ICD-10-CM | POA: Diagnosis present

## 2022-10-23 MED ORDER — ALBUTEROL SULFATE (2.5 MG/3ML) 0.083% IN NEBU: 2.5000 mg | INHALATION_SOLUTION | RESPIRATORY_TRACT | Status: DC | PRN

## 2022-10-23 NOTE — Progress Notes (Addendum)
PROGRESS NOTE   Kristine Todd  ZOX:[id number] DOB: [birth year] DOA: 10/18/2022 PCP: Patient, No Pcp Per   Date of Service: the patient was seen and examined on 10/23/2022  Brief Narrative:  77 year old female with past medical history of dementia, hypertension, diabetes mellitus type 2, diastolic congestive heart failure, hyperlipidemia, gastroesophageal reflux disease, chronic kidney disease stage III, chronic hypoxic respiratory failure on 4 L of oxygen via nasal cannula at baseline brought to [facility] from [facility] skilled [facility] facility with hypotension and shortness of breath.  Upon evaluation in the emergency department the patient was found to be hypotensive and in acute kidney injury with an abnormal urinalysis concerns for sepsis secondary to urinary tract infection and septic shock.  Patient was initiated on intravenous ceftriaxone initially admitted under the PCCM service to the intensive care unit for initiation of vasopressors.  Patient clinically improved in the days that followed with downtrending creatinine, improving blood pressures and discontinuation of vasopressors.  With all cultures coming back negative intravenous ceftriaxone was discontinued after 4 days.    After goals of care discussions between PCCM and family, decision was made to transition patient to comfort measures.  Patient was then transferred to the hospitalist service.    Assessment and Plan: * multifactorial distributive shock Patient remains off of antibiotics and hemodynamically stable Due to overall poor prognosis patient is now comfort measures Supportive care, eventual discharge to skilled nursing facility with hospice  AKI (acute kidney injury) (HCC) Improved with intravenous volume resuscitation Now supportive care  Chronic respiratory failure with hypoxia (HCC) Continue supplemental oxygen  Dementia without behavioral disturbance (HCC) Advanced dementia with failure to  thrive  patient unable to contribute with decision making and overall poor prognosis  Type 2 diabetes mellitus without complication, without long-term current use of insulin (HCC) Supportive care, Accu-Cheks as needed  COPD (chronic obstructive pulmonary disease) (HCC) As needed bronchodilator therapy for symptoms.  Goals of care, counseling/discussion Advanced dementia with ongoing failure to thrive overall extremely poor prognosis Goals of care discussions have already been held by [facility] and my partner Dr. Jerral Ralph.  Palliative care is also following. Patient is now comfort measures, plan is to eventually discharged to [facility] skilled nursing facility with hospice services.    Subjective:  Patient is unable to answer questions appropriately due to advanced dementia and lethargy.  Physical Exam:  Vitals:   10/21/22 1300 10/21/22 1534 10/22/22 0517 10/23/22 0218  BP: (!) 122/40 97/81 131/75 132/81  Pulse: 95 92 (!) 102 (!) 126  Resp: 13 16 18 20   Temp:  99.3 F (37.4 C) 98.3 F (36.8 C) 97.6 F (36.4 C)  TempSrc:  Oral Oral Oral  SpO2: 93% 96% 94% 94%  Weight:      Height:        Constitutional: Lethargic but arousable, oriented x 1, not in any acute distress. Skin: no rashes, no lesions, poor skin turgor noted. Eyes: Pupils are equally reactive to light.  No evidence of scleral icterus or conjunctival pallor.  ENMT: Slightly dry mucous membranes noted.  Posterior pharynx clear of any exudate or lesions.   Respiratory: clear to auscultation bilaterally, no wheezing, no crackles. Normal respiratory effort. No accessory muscle use.  Cardiovascular: Regular rate and rhythm, no murmurs / rubs / gallops. No extremity edema. 2+ pedal pulses. No carotid bruits.  Abdomen: Abdomen is soft and nontender.  No evidence of intra-abdominal masses.  Positive bowel sounds noted in all quadrants.   Musculoskeletal: Notable inward  rotation of the right foot noted with some associated  contracture.  Poor muscle tone noted.  Data Reviewed:  I have personally reviewed and interpreted labs, imaging.  Significant findings are   CBC: Recent Labs  Lab 10/18/22 1920 10/18/22 1948 10/18/22 2024 10/19/22 0252 10/20/22 0319  WBC 11.5*  --   --  11.5* 9.8  NEUTROABS 9.5*  --   --   --   --   HGB 11.0* 12.9 13.3 10.6* 9.9*  HCT 39.5 38.0 39.0 37.3 33.8*  MCV 91.0  --   --  90.3 86.7  PLT 298  --   --  300 257   Basic Metabolic Panel: Recent Labs  Lab 10/18/22 1920 10/18/22 1948 10/18/22 2205 10/19/22 0252 10/19/22 0641 10/19/22 1158 10/19/22 1551 10/20/22 0319 10/21/22 0251  NA 141   < >  --  138 137 135 138 140 141  K 6.2*   < >  --  5.6* 5.8* 5.5* 5.2* 4.6 3.8  CL 94*   < >  --  96* 95* 94* 96* 97* 101  CO2 20*  --   --  17* 18* 25 27 29 29   GLUCOSE 63*   < >  --  278* 310* 288* 182* 117* 114*  BUN 73*   < >  --  74* 75* 76* 74* 76* 57*  CREATININE 4.87*   < >  --  4.53* 4.51* 4.14* 4.05* 3.40* 2.26*  CALCIUM 9.2  --   --  8.8* 8.7* 8.6* 8.8* 8.9 8.7*  MG 3.0*  --   --  2.7*  --   --   --  2.5* 2.3  PHOS  --   --  7.9* 7.2*  --   --   --  4.9* 3.2   < > = values in this interval not displayed.   GFR: Estimated Creatinine Clearance: 21.2 mL/min (A) (by C-G formula based on SCr of 2.26 mg/dL (H)). Liver Function Tests: Recent Labs  Lab 10/18/22 1920  AST 33  ALT 22  ALKPHOS 63  BILITOT 0.7  PROT 7.1  ALBUMIN 3.4*    Coagulation Profile: Recent Labs  Lab 10/18/22 1920  INR 1.7*     Code Status:  DNR.  Code status decision has been confirmed with: patients son Family Communication: Patient's son was updated on plan of care on 10/15 by Dr. Jerral Ralph   Severity of Illness:  The appropriate patient status for this patient is INPATIENT. Inpatient status is judged to be reasonable and necessary in order to provide the required intensity of service to ensure the patient's safety. The patient's presenting symptoms, physical exam findings, and  initial radiographic and laboratory data in the context of their chronic comorbidities is felt to place them at high risk for further clinical deterioration. Furthermore, it is not anticipated that the patient will be medically stable for discharge from the hospital within 2 midnights of admission.   * I certify that at the point of admission it is my clinical judgment that the patient will require inpatient hospital care spanning beyond 2 midnights from the point of admission due to high intensity of service, high risk for further deterioration and high frequency of surveillance required.*  Time spent:  35 minutes  Author:  Marinda Elk MD  10/23/2022 10:43 PM

## 2022-10-23 NOTE — Plan of Care (Signed)
  Problem: Nutrition: Goal: Adequate nutrition will be maintained Outcome: Progressing   

## 2022-10-23 NOTE — Assessment & Plan Note (Signed)
As needed bronchodilator therapy for symptoms.

## 2022-10-23 NOTE — Assessment & Plan Note (Signed)
Advanced dementia with ongoing failure to thrive overall extremely poor prognosis Goals of care discussions have already been held by [facility] and my partner Dr. Jerral Ralph.  Palliative care is also following. Patient is now comfort measures, plan is to eventually discharged to [facility] skilled nursing facility with hospice services.

## 2022-10-23 NOTE — Assessment & Plan Note (Signed)
Continue supplemental oxygen.

## 2022-10-23 NOTE — Assessment & Plan Note (Signed)
Supportive care, Accu-Cheks as needed

## 2022-10-23 NOTE — [place] FL2 (Signed)
[place] MEDICAID FL2 LEVEL OF CARE FORM     IDENTIFICATION  Patient Name: Kristine Todd Birthdate: 1945-03-03 Sex: female Admission Date (Current Location): 10/18/2022  [place] and [place] Number:  Haynes Bast [id number] Q Facility and Address:  [facility],  [address], [place] [address]      Provider Number: [id number]  Attending Physician Name and Address:  Marinda Elk, MD  Relative Name and Phone Number:  Laney Potash [phone]    Current Level of Care: Hospital Recommended Level of Care: Skilled Nursing Facility Prior Approval Number:    Date Approved/Denied:   PASRR Number: [id number]  Discharge Plan: SNF    Current Diagnoses: Patient Active Problem List   Diagnosis Date Noted   Shock (HCC) 10/18/2022   Diabetes mellitus type 2, uncontrolled 12/19/2018   COVID-19 virus infection 07/29/2018   Hyperosmolar hyperglycemic coma due to diabetes mellitus without ketoacidosis (HCC) 02/21/2018   New onset type 2 diabetes mellitus (HCC) 02/21/2018   Hypernatremia 02/21/2018   Hypokalemia 02/21/2018   Decubitus ulcer of coccygeal region, stage 2 (HCC) 02/21/2018   GERD (gastroesophageal reflux disease) 12/21/2017   Declining functional status 08/18/2017   Decreased functional mobility 08/18/2017   CKD (chronic kidney disease) stage 2, GFR 60-89 ml/min 06/02/2017   Arthritis 06/02/2017   GI bleed 02/15/2017   Acute blood loss anemia 02/15/2017   Escherichia coli urinary tract infection 02/15/2017   Respiratory failure, chronic (HCC) 02/15/2017   Edema of upper extremity 02/15/2017   Bilateral lower extremity edema 02/15/2017   Pulmonary embolism (HCC) 02/08/2017   Hypertension 02/08/2017   Polyneuropathy 02/08/2017   Depression 02/08/2017    Orientation RESPIRATION BLADDER Height & Weight     Self  Normal Indwelling catheter, Incontinent (end of life care) Weight: 86.5 kg Height:  5\' 1"  (154.9 cm)  BEHAVIORAL SYMPTOMS/MOOD  NEUROLOGICAL BOWEL NUTRITION STATUS     (n/a) Incontinent Diet  AMBULATORY STATUS COMMUNICATION OF NEEDS Skin   Total Care Verbally Other (Comment) (dry flacy redness noted)                       Personal Care Assistance Level of Assistance  Bathing, Feeding, Dressing Bathing Assistance: Maximum assistance Feeding assistance: Maximum assistance (Feeder) Dressing Assistance: Maximum assistance     Functional Limitations Info  Sight, Hearing, Speech Sight Info: Adequate Hearing Info: Adequate Speech Info: Adequate    SPECIAL CARE FACTORS FREQUENCY                       Contractures Contractures Info: Not present    Additional Factors Info  Code Status, Allergies, Psychotropic, Insulin Sliding Scale, Isolation Precautions, Suctioning Needs Code Status Info: DNR Allergies Info: NKA Psychotropic Info: see d/c summary Insulin Sliding Scale Info: see d/c summary Isolation Precautions Info: n/a Suctioning Needs: n/a   Current Medications (10/23/2022):  This is the current hospital active medication list Current Facility-Administered Medications  Medication Dose Route Frequency Provider Last Rate Last Admin   acetaminophen (TYLENOL) tablet 650 mg  650 mg Oral Q6H PRN Selmer Dominion B, NP       Or   acetaminophen (TYLENOL) suppository 650 mg  650 mg Rectal Q6H PRN Norton Blizzard, NP       bisacodyl (DULCOLAX) suppository 10 mg  10 mg Rectal Daily PRN Selmer Dominion B, NP       diphenhydrAMINE (BENADRYL) injection 12.5 mg  12.5 mg Intravenous Q4H PRN Norton Blizzard, NP  HYDROmorphone (DILAUDID) injection 0.5-2 mg  0.5-2 mg Intravenous Q30 min PRN Selmer Dominion B, NP       lip balm (CARMEX) ointment 1 Application  1 Application Topical PRN Patrici Ranks, MD   1 Application at 10/19/22 2244   LORazepam (ATIVAN) tablet 1 mg  1 mg Oral Q6H PRN Norton Blizzard, NP       Or   LORazepam (ATIVAN) 2 MG/ML concentrated solution 1 mg  1 mg Sublingual Q6H PRN  Selmer Dominion B, NP       Or   LORazepam (ATIVAN) tablet 1 mg  1 mg Oral Q6H PRN Selmer Dominion B, NP       mirtazapine (REMERON SOL-TAB) disintegrating tablet 7.5 mg  7.5 mg Oral QHS Selmer Dominion B, NP   7.5 mg at 10/22/22 2050   Oral care mouth rinse  15 mL Mouth Rinse PRN Albustami, Flonnie Hailstone, MD       oxyCODONE (Oxy IR/ROXICODONE) immediate release tablet 5 mg  5 mg Oral Q4H PRN Selmer Dominion B, NP       sodium chloride flush (NS) 0.9 % injection 3 mL  3 mL Intravenous Q12H Leslye Peer, MD   3 mL at 10/23/22 1011     Discharge Medications: Please see discharge summary for a list of discharge medications.  Relevant Imaging Results:  Relevant Lab Results:   Additional Information SS#  [id number]  Beckie Busing, RN

## 2022-10-23 NOTE — Plan of Care (Signed)

## 2022-10-23 NOTE — Assessment & Plan Note (Addendum)
Advanced dementia with failure to thrive  patient unable to contribute with decision making and overall poor prognosis

## 2022-10-23 NOTE — Hospital Course (Signed)
77 year old female with past medical history of dementia, hypertension, diabetes mellitus type 2, diastolic congestive heart failure, hyperlipidemia, gastroesophageal reflux disease, chronic kidney disease stage III, chronic hypoxic respiratory failure on 4 L of oxygen via nasal cannula at baseline brought to [facility] with hypotension and shortness of breath.  Upon evaluation in the emergency department the patient was found to be hypotensive and in acute kidney injury with an abnormal urinalysis concerns for sepsis secondary to urinary tract infection and septic shock.  Patient was initiated on intravenous ceftriaxone initially admitted under the PCCM service to the intensive care unit for initiation of vasopressors.  Patient clinically improved in the days that followed with downtrending creatinine, improving blood pressures and discontinuation of vasopressors.  With all cultures coming back negative intravenous ceftriaxone was discontinued after 4 days.    After goals of care discussions between PCCM and family, decision was made to transition patient to comfort measures.  Patient was then transferred to the hospitalist service.  In the days that followed after multiple follow-up goals of care planning meetings were had between palliative care, the hospitalist service and [facility] arranges were made for the patient to be discharged to [facility] with hospice services in stable condition.

## 2022-10-23 NOTE — Assessment & Plan Note (Signed)
Improved with intravenous volume resuscitation Now supportive care

## 2022-10-23 NOTE — Progress Notes (Signed)
PMT no charge note.    Chart reviewed, discussed with TOC colleague, patient noted to be resting comfortably in bed, does not appear to be in any distress, family member also resting at bedside.  No acute symptom management needs at this time, medication history noted, has not required as needed pain medications.  Patient has oral oxycodone immediate release as well as IV hydromorphone available for pain control.  Overall focus to continue to remain on comfort measures and for the patient to transition back to her facility with addition of hospice services at the point of discharge.  No new inpatient palliative specific recommendations at this time.  Continue current comfort-focused care. No charge Kristine Hawking MD [place] palliative.

## 2022-10-23 NOTE — Assessment & Plan Note (Signed)
Patient remains off of antibiotics and hemodynamically stable Due to overall poor prognosis patient is now comfort measures Supportive care, eventual discharge to skilled nursing facility with hospice

## 2022-10-23 NOTE — TOC Progression Note (Signed)
Transition of Care [facility]) - Progression Note    Patient Details  Name: Kristine Todd MRN: [id number] Date of Birth: 05/23/1945  Transition of Care [facility]) CM/SW Contact  Beckie Busing, RN Phone [phone]  10/23/2022, 2:07 PM  Clinical Narrative:    CM spoke with son [name] who is agreeable with patient returning to [facility] with Hospice to follow. CM will update MD.    Expected Discharge Plan: Long Term Nursing Home Barriers to Discharge: Continued Medical Work up  Expected Discharge Plan and Services In-house Referral: Clinical Social Work   Post Acute Care Choice: Skilled Nursing Facility Living arrangements for the past 2 months: Skilled Nursing Facility                 DME Arranged: N/A DME Agency: NA                   Social Determinants of Health (SDOH) Interventions SDOH Screenings   Food Insecurity: No Food Insecurity (10/19/2022)  Housing: Low Risk  (10/19/2022)  Transportation Needs: No Transportation Needs (10/19/2022)  Utilities: Not At Risk (10/19/2022)  Recent Concern: Utilities - Medium Risk (09/04/2022)   Received from [facility]  Depression (PHQ2-9): Low Risk  (11/02/2018)  Financial Resource Strain: Low Risk  (11/21/2017)  Physical Activity: Inactive (11/21/2017)  Social Connections: Moderately Isolated (11/21/2017)  Stress: No Stress Concern Present (11/21/2017)  Tobacco Use: Medium Risk (10/19/2022)    Readmission Risk Interventions    10/21/2022   11:16 AM  Readmission Risk Prevention Plan  Transportation Screening Complete  HRI or Home Care Consult Complete  Social Work Consult for Recovery Care Planning/Counseling Complete  Palliative Care Screening Not Applicable  Medication Review Oceanographer) Complete

## 2022-10-24 DIAGNOSIS — R579 Shock, unspecified: Secondary | ICD-10-CM | POA: Diagnosis not present

## 2022-10-24 DIAGNOSIS — F0390 Unspecified dementia without behavioral disturbance: Secondary | ICD-10-CM | POA: Diagnosis not present

## 2022-10-24 DIAGNOSIS — N179 Acute kidney failure, unspecified: Secondary | ICD-10-CM | POA: Diagnosis not present

## 2022-10-24 DIAGNOSIS — J9611 Chronic respiratory failure with hypoxia: Secondary | ICD-10-CM | POA: Diagnosis not present

## 2022-10-24 LAB — CULTURE, BLOOD (ROUTINE X 2)
Culture  Setup Time: NO GROWTH
Culture: NO GROWTH
Special Requests: ADEQUATE

## 2022-10-24 MED ORDER — OXYCODONE HCL 5 MG PO TABS: 5.0000 mg | ORAL_TABLET | ORAL | 0 refills | Status: AC | PRN

## 2022-10-24 MED ORDER — ORAL CARE MOUTH RINSE: 15.0000 mL | OROMUCOSAL | Status: AC | PRN

## 2022-10-24 MED ORDER — DIPHENHYDRAMINE HCL 25 MG PO TABS: 25.0000 mg | ORAL_TABLET | Freq: Four times a day (QID) | ORAL | Status: AC | PRN

## 2022-10-24 MED ORDER — MIRTAZAPINE 15 MG PO TBDP: 7.5000 mg | ORAL_TABLET | Freq: Every day | ORAL | Status: AC

## 2022-10-24 MED ORDER — CARMEX CLASSIC LIP BALM EX OINT: 1.0000 | TOPICAL_OINTMENT | CUTANEOUS | Status: AC | PRN

## 2022-10-24 MED ORDER — ACETAMINOPHEN 325 MG PO TABS: 650.0000 mg | ORAL_TABLET | Freq: Four times a day (QID) | ORAL | Status: AC | PRN

## 2022-10-24 MED ORDER — BISACODYL 10 MG RE SUPP: 10.0000 mg | Freq: Every day | RECTAL | Status: AC | PRN

## 2022-10-24 MED ORDER — LORAZEPAM 2 MG/ML PO CONC: 1.0000 mg | Freq: Four times a day (QID) | ORAL | 0 refills | Status: AC | PRN

## 2022-10-24 NOTE — Discharge Instructions (Addendum)
Patient is DNR and comfort measures Activity level: Patient is bedbound but may sit up in bed or be moved to a recliner with assistance. Patient received today regular diet for comfort feeds. Patient requires assistance with all meals. Wound Care bilateral ischial wounds: Every 3 days clean with wounds with saline or water, apply silicone foam dressings to the bilateral ischial wounds.  Change every 3 days.   Wound care, sacral wound: Daily, cleanse sacral wound with Vashe, pat dry.  Apply 1/4 inch thick layer of Medihoney to wound bed, toppled saline moist gauze, top with silicone foam. Patient has an indwelling foley catheter due to multiple areas of skin breakdown.  Please maintain foley catheter per facility protocol and change Q30 days.

## 2022-10-24 NOTE — Progress Notes (Signed)
Report called to Deliah Goody, LPN  @ [facility]. Patient awaiting ambulance transport back to facility. Patient 's daughter at bedside and is aware of discharge.

## 2022-10-24 NOTE — Progress Notes (Signed)
[facility] Liaison Note  Received request from [place], Transitions of Care Manager, for hospice services at facility after discharge. Spoke with son, [name] to initiate education related to hospice philosophy, services, and team approach to care. Patient/family verbalized understanding of information given. Per discussion, the plan is for discharge to facility today by PTAR/EMS .  DME needs discussed. Patient has no additional DME needs currently.  Please send signed and completed DNR with patient/family. Please provide prescriptions at discharge as needed to ensure ongoing symptom management.   [facility] information and contact numbers given to [place]. Above information shared with Samson Frederic, Transitions of Care Manager. Please call with any questions or concerns.   Thank you for the opportunity to participate in this patient's care.   Glenna Fellows BSN, Charity fundraiser, OCN [employer] [phone]

## 2022-10-24 NOTE — TOC Transition Note (Signed)
Transition of Care [facility]) - CM/SW Discharge Note   Patient Details  Name: Kristine Todd MRN: [id number] Date of Birth: [birth year]  Transition [facility] Care [facility]) CM/SW Contact:  Beckie Busing, RN Phone [phone]  10/24/2022, 11:46 AM   Clinical Narrative:    Patient discharging to [facility] with [facility] to follow for Hospice. Son has been updated. Transportation arranged per [employer]. D/c packet is at nurses station.  Nurse updated with report info. [facility]  Room# [phone] No other TOC needs noted. TOC will sign off.     Final next level of care: Skilled Nursing Facility Barriers to Discharge: No Barriers Identified   Patient Goals and CMS Choice CMS Medicare.gov Compare Post Acute Care list provided to:: Patient Represenative (must comment) Choice offered to / list presented to : Adult Children  Discharge Placement                Patient chooses bed at: [facility] Patient to be transferred to facility by: PTAR Name of family member notified: Laney Potash Patient and family notified of of transfer: 10/24/22  Discharge Plan and Services Additional resources added to the After Visit Summary for   In-house Referral: Clinical Social Work   Post Acute Care Choice: Skilled Nursing Facility          DME Arranged: N/A DME Agency: NA       HH Arranged: NA HH Agency: NA        Social Determinants of Health (SDOH) Interventions SDOH Screenings   Food Insecurity: No Food Insecurity (10/19/2022)  Housing: Low Risk  (10/19/2022)  Transportation Needs: No Transportation Needs (10/19/2022)  Utilities: Not At Risk (10/19/2022)  Recent Concern: Utilities - Medium Risk (09/04/2022)   Received from [facility]  Depression (PHQ2-9): Low Risk  (11/02/2018)  Financial Resource Strain: Low Risk  (11/21/2017)  Physical Activity: Inactive (11/21/2017)  Social Connections: Moderately Isolated (11/21/2017)  Stress: No Stress Concern  Present (11/21/2017)  Tobacco Use: Medium Risk (10/19/2022)     Readmission Risk Interventions    10/21/2022   11:16 AM  Readmission Risk Prevention Plan  Transportation Screening Complete  HRI or Home Care Consult Complete  Social Work Consult for Recovery Care Planning/Counseling Complete  Palliative Care Screening Not Applicable  Medication Review Oceanographer) Complete

## 2022-10-24 NOTE — Discharge Summary (Addendum)
Monte Fantasia Hart [place] # [phone]), pat dry. Apply Apply 1/4" thick layer of leptospermum honey to wound bed, top with saline moist gauze, top with silicone foam,change daily. Ok to lift silicone foam to reapply Medihoney daily.   10/24/22 1038             Discharge Exam: Filed Weights   10/19/22 0204 10/20/22 0500 10/21/22 0500  Weight: 88.1 kg 86.7 kg 86.5 kg    Constitutional: Awake alert and oriented x 1 Respiratory: clear to auscultation bilaterally, no wheezing, no crackles. Normal respiratory effort. No accessory muscle use.  Cardiovascular: Regular rate and rhythm, no murmurs / rubs / gallops. No extremity edema. 2+ pedal pulses. No carotid bruits.  Abdomen: Abdomen is soft and nontender.  No evidence of intra-abdominal masses.  Positive bowel sounds noted in all quadrants.   Musculoskeletal: No  joint deformity upper and lower extremities. Good ROM, no contractures. Normal muscle tone.     Condition at discharge: fair  The results of significant diagnostics from this hospitalization (including imaging, microbiology, ancillary and laboratory) are listed below for reference.   Imaging Studies: ECHOCARDIOGRAM COMPLETE  Result Date: 10/19/2022    ECHOCARDIOGRAM REPORT   Patient Name:   Kristine Todd Hernandes Date of Exam: 10/19/2022 Medical Rec #:  [id number]       Height:       61.0 in Accession #:    [id number]      Weight:       194.2 lb Date of Birth:  [birth year]       BSA:          1.865 m Patient Age:    77 years        BP:           104/62 mmHg Patient Gender: F               HR:           84 bpm. Exam Location:  Inpatient Procedure: 2D Echo, Cardiac Doppler, Color Doppler and Intracardiac            Opacification Agent Indications:    Acute myocardial infarction                 Shock  History:        Patient has prior history of Echocardiogram examinations, most                 recent 09/28/2022. CHF, Pulmonary HTN,                 Signs/Symptoms:Hypotension; Risk Factors:Diabetes, Dyslipidemia,                 Hypertension and Morbid Obesity. Cor pulmonale, CKD, chronic                 hypoxic respiratory failure, hx of PE.  Sonographer:    Milda Smart Referring Phys: [id number] OMAR M ALBUSTAMI  Sonographer Comments: Image acquisition challenging due to patient body habitus and Image acquisition challenging due to respiratory motion. Pt somnolent during exam. IMPRESSIONS  1. Left ventricular ejection fraction, by estimation, is 60 to 65%. The left ventricle has normal function. The left ventricle has no regional wall motion abnormalities. There is mild left ventricular hypertrophy. Left ventricular diastolic parameters are indeterminate. There is the interventricular septum is flattened in systole, consistent with right ventricular pressure overload.  2. Right ventricular systolic function is  moderate-severely reduced. The right ventricular size is severely enlarged. There is severely elevated  Monte Fantasia Hart [place] # [phone]), pat dry. Apply Apply 1/4" thick layer of leptospermum honey to wound bed, top with saline moist gauze, top with silicone foam,change daily. Ok to lift silicone foam to reapply Medihoney daily.   10/24/22 1038             Discharge Exam: Filed Weights   10/19/22 0204 10/20/22 0500 10/21/22 0500  Weight: 88.1 kg 86.7 kg 86.5 kg    Constitutional: Awake alert and oriented x 1 Respiratory: clear to auscultation bilaterally, no wheezing, no crackles. Normal respiratory effort. No accessory muscle use.  Cardiovascular: Regular rate and rhythm, no murmurs / rubs / gallops. No extremity edema. 2+ pedal pulses. No carotid bruits.  Abdomen: Abdomen is soft and nontender.  No evidence of intra-abdominal masses.  Positive bowel sounds noted in all quadrants.   Musculoskeletal: No  joint deformity upper and lower extremities. Good ROM, no contractures. Normal muscle tone.     Condition at discharge: fair  The results of significant diagnostics from this hospitalization (including imaging, microbiology, ancillary and laboratory) are listed below for reference.   Imaging Studies: ECHOCARDIOGRAM COMPLETE  Result Date: 10/19/2022    ECHOCARDIOGRAM REPORT   Patient Name:   Kristine Todd Hernandes Date of Exam: 10/19/2022 Medical Rec #:  [id number]       Height:       61.0 in Accession #:    [id number]      Weight:       194.2 lb Date of Birth:  [birth year]       BSA:          1.865 m Patient Age:    77 years        BP:           104/62 mmHg Patient Gender: F               HR:           84 bpm. Exam Location:  Inpatient Procedure: 2D Echo, Cardiac Doppler, Color Doppler and Intracardiac            Opacification Agent Indications:    Acute myocardial infarction                 Shock  History:        Patient has prior history of Echocardiogram examinations, most                 recent 09/28/2022. CHF, Pulmonary HTN,                 Signs/Symptoms:Hypotension; Risk Factors:Diabetes, Dyslipidemia,                 Hypertension and Morbid Obesity. Cor pulmonale, CKD, chronic                 hypoxic respiratory failure, hx of PE.  Sonographer:    Milda Smart Referring Phys: [id number] OMAR M ALBUSTAMI  Sonographer Comments: Image acquisition challenging due to patient body habitus and Image acquisition challenging due to respiratory motion. Pt somnolent during exam. IMPRESSIONS  1. Left ventricular ejection fraction, by estimation, is 60 to 65%. The left ventricle has normal function. The left ventricle has no regional wall motion abnormalities. There is mild left ventricular hypertrophy. Left ventricular diastolic parameters are indeterminate. There is the interventricular septum is flattened in systole, consistent with right ventricular pressure overload.  2. Right ventricular systolic function is  moderate-severely reduced. The right ventricular size is severely enlarged. There is severely elevated  Monte Fantasia Hart [place] # [phone]), pat dry. Apply Apply 1/4" thick layer of leptospermum honey to wound bed, top with saline moist gauze, top with silicone foam,change daily. Ok to lift silicone foam to reapply Medihoney daily.   10/24/22 1038             Discharge Exam: Filed Weights   10/19/22 0204 10/20/22 0500 10/21/22 0500  Weight: 88.1 kg 86.7 kg 86.5 kg    Constitutional: Awake alert and oriented x 1 Respiratory: clear to auscultation bilaterally, no wheezing, no crackles. Normal respiratory effort. No accessory muscle use.  Cardiovascular: Regular rate and rhythm, no murmurs / rubs / gallops. No extremity edema. 2+ pedal pulses. No carotid bruits.  Abdomen: Abdomen is soft and nontender.  No evidence of intra-abdominal masses.  Positive bowel sounds noted in all quadrants.   Musculoskeletal: No  joint deformity upper and lower extremities. Good ROM, no contractures. Normal muscle tone.     Condition at discharge: fair  The results of significant diagnostics from this hospitalization (including imaging, microbiology, ancillary and laboratory) are listed below for reference.   Imaging Studies: ECHOCARDIOGRAM COMPLETE  Result Date: 10/19/2022    ECHOCARDIOGRAM REPORT   Patient Name:   Kristine Todd Hernandes Date of Exam: 10/19/2022 Medical Rec #:  [id number]       Height:       61.0 in Accession #:    [id number]      Weight:       194.2 lb Date of Birth:  [birth year]       BSA:          1.865 m Patient Age:    77 years        BP:           104/62 mmHg Patient Gender: F               HR:           84 bpm. Exam Location:  Inpatient Procedure: 2D Echo, Cardiac Doppler, Color Doppler and Intracardiac            Opacification Agent Indications:    Acute myocardial infarction                 Shock  History:        Patient has prior history of Echocardiogram examinations, most                 recent 09/28/2022. CHF, Pulmonary HTN,                 Signs/Symptoms:Hypotension; Risk Factors:Diabetes, Dyslipidemia,                 Hypertension and Morbid Obesity. Cor pulmonale, CKD, chronic                 hypoxic respiratory failure, hx of PE.  Sonographer:    Milda Smart Referring Phys: [id number] OMAR M ALBUSTAMI  Sonographer Comments: Image acquisition challenging due to patient body habitus and Image acquisition challenging due to respiratory motion. Pt somnolent during exam. IMPRESSIONS  1. Left ventricular ejection fraction, by estimation, is 60 to 65%. The left ventricle has normal function. The left ventricle has no regional wall motion abnormalities. There is mild left ventricular hypertrophy. Left ventricular diastolic parameters are indeterminate. There is the interventricular septum is flattened in systole, consistent with right ventricular pressure overload.  2. Right ventricular systolic function is  moderate-severely reduced. The right ventricular size is severely enlarged. There is severely elevated  Monte Fantasia Hart [place] # [phone]), pat dry. Apply Apply 1/4" thick layer of leptospermum honey to wound bed, top with saline moist gauze, top with silicone foam,change daily. Ok to lift silicone foam to reapply Medihoney daily.   10/24/22 1038             Discharge Exam: Filed Weights   10/19/22 0204 10/20/22 0500 10/21/22 0500  Weight: 88.1 kg 86.7 kg 86.5 kg    Constitutional: Awake alert and oriented x 1 Respiratory: clear to auscultation bilaterally, no wheezing, no crackles. Normal respiratory effort. No accessory muscle use.  Cardiovascular: Regular rate and rhythm, no murmurs / rubs / gallops. No extremity edema. 2+ pedal pulses. No carotid bruits.  Abdomen: Abdomen is soft and nontender.  No evidence of intra-abdominal masses.  Positive bowel sounds noted in all quadrants.   Musculoskeletal: No  joint deformity upper and lower extremities. Good ROM, no contractures. Normal muscle tone.     Condition at discharge: fair  The results of significant diagnostics from this hospitalization (including imaging, microbiology, ancillary and laboratory) are listed below for reference.   Imaging Studies: ECHOCARDIOGRAM COMPLETE  Result Date: 10/19/2022    ECHOCARDIOGRAM REPORT   Patient Name:   Kristine Todd Hernandes Date of Exam: 10/19/2022 Medical Rec #:  [id number]       Height:       61.0 in Accession #:    [id number]      Weight:       194.2 lb Date of Birth:  [birth year]       BSA:          1.865 m Patient Age:    77 years        BP:           104/62 mmHg Patient Gender: F               HR:           84 bpm. Exam Location:  Inpatient Procedure: 2D Echo, Cardiac Doppler, Color Doppler and Intracardiac            Opacification Agent Indications:    Acute myocardial infarction                 Shock  History:        Patient has prior history of Echocardiogram examinations, most                 recent 09/28/2022. CHF, Pulmonary HTN,                 Signs/Symptoms:Hypotension; Risk Factors:Diabetes, Dyslipidemia,                 Hypertension and Morbid Obesity. Cor pulmonale, CKD, chronic                 hypoxic respiratory failure, hx of PE.  Sonographer:    Milda Smart Referring Phys: [id number] OMAR M ALBUSTAMI  Sonographer Comments: Image acquisition challenging due to patient body habitus and Image acquisition challenging due to respiratory motion. Pt somnolent during exam. IMPRESSIONS  1. Left ventricular ejection fraction, by estimation, is 60 to 65%. The left ventricle has normal function. The left ventricle has no regional wall motion abnormalities. There is mild left ventricular hypertrophy. Left ventricular diastolic parameters are indeterminate. There is the interventricular septum is flattened in systole, consistent with right ventricular pressure overload.  2. Right ventricular systolic function is  moderate-severely reduced. The right ventricular size is severely enlarged. There is severely elevated  Physician Discharge Summary   Patient: Kristine Todd MRN: [id number] DOB: 1945-01-15  Admit date:     10/18/2022  Discharge date: 10/24/22  Discharge Physician: Marinda Elk   PCP: Patient, No Pcp Per   Recommendations at discharge:   Patient is DNR and comfort measures Activity level: Patient is bedbound but may sit up in bed or be moved to a recliner with assistance. Patient received today regular diet for comfort feeds. Patient requires assistance with all meals. Wound Care bilateral ischial wounds: Every 3 days clean with wounds with saline or water, apply silicone foam dressings to the bilateral ischial wounds.  Change every 3 days.   Wound care, sacral wound: Daily, cleanse sacral wound with Vashe, pat dry.  Apply 1/4 inch thick layer of Medihoney to wound bed, toppled saline moist gauze, top with silicone foam. Patient has an indwelling foley catheter due to multiple areas of skin breakdown.  Please maintain foley catheter per facility protocol and change Q30 days.  Discharge Diagnoses: Principal Problem:   multifactorial distributive shock Active Problems:   AKI (acute kidney injury) (HCC)   Chronic respiratory failure with hypoxia (HCC)   Dementia without behavioral disturbance (HCC)   Type 2 diabetes mellitus without complication, without long-term current use of insulin (HCC)   COPD (chronic obstructive pulmonary disease) (HCC)   Goals of care, counseling/discussion  Resolved Problems:   * No resolved hospital problems. *   Hospital Course: 77 year old female with past medical history of dementia, hypertension, diabetes mellitus type 2, diastolic congestive heart failure, hyperlipidemia, gastroesophageal reflux disease, chronic kidney disease stage III, chronic hypoxic respiratory failure on 4 L of oxygen via nasal cannula at baseline brought to [facility] from [facility] with hypotension and shortness of breath.  Upon  evaluation in the emergency department the patient was found to be hypotensive and in acute kidney injury with an abnormal urinalysis concerns for sepsis secondary to urinary tract infection and septic shock.  Patient was initiated on intravenous ceftriaxone initially admitted under the PCCM service to the intensive care unit for initiation of vasopressors.  Patient clinically improved in the days that followed with downtrending creatinine, improving blood pressures and discontinuation of vasopressors.  With all cultures coming back negative intravenous ceftriaxone was discontinued after 4 days.    After goals of care discussions between PCCM and family, decision was made to transition patient to comfort measures.  Patient was then transferred to the hospitalist service.  In the days that followed after multiple follow-up goals of care planning meetings were had between palliative care, the hospitalist service and [facility] arranges were made for the patient to be discharged to [facility] with hospice services in stable condition.       Pain control - [employer] Controlled Substance Reporting System database was reviewed. and patient was instructed, not to drive, operate heavy machinery, perform activities at heights, swimming or participation in water activities or provide baby-sitting services while on Pain, Sleep and Anxiety Medications; until their outpatient Physician has advised to do so again. Also recommended to not to take more than prescribed Pain, Sleep and Anxiety Medications.   Consultants: Dr. Linna Darner with palliative Care Procedures performed: None  Disposition: Skilled nursing facility with hospice Diet recommendation:  Discharge Diet Orders (From admission, onward)     Start     Ordered   10/24/22 0000  Diet general       Comments: Patient needs assistance with all  Monte Fantasia Hart [place] # [phone]), pat dry. Apply Apply 1/4" thick layer of leptospermum honey to wound bed, top with saline moist gauze, top with silicone foam,change daily. Ok to lift silicone foam to reapply Medihoney daily.   10/24/22 1038             Discharge Exam: Filed Weights   10/19/22 0204 10/20/22 0500 10/21/22 0500  Weight: 88.1 kg 86.7 kg 86.5 kg    Constitutional: Awake alert and oriented x 1 Respiratory: clear to auscultation bilaterally, no wheezing, no crackles. Normal respiratory effort. No accessory muscle use.  Cardiovascular: Regular rate and rhythm, no murmurs / rubs / gallops. No extremity edema. 2+ pedal pulses. No carotid bruits.  Abdomen: Abdomen is soft and nontender.  No evidence of intra-abdominal masses.  Positive bowel sounds noted in all quadrants.   Musculoskeletal: No  joint deformity upper and lower extremities. Good ROM, no contractures. Normal muscle tone.     Condition at discharge: fair  The results of significant diagnostics from this hospitalization (including imaging, microbiology, ancillary and laboratory) are listed below for reference.   Imaging Studies: ECHOCARDIOGRAM COMPLETE  Result Date: 10/19/2022    ECHOCARDIOGRAM REPORT   Patient Name:   Kristine Todd Hernandes Date of Exam: 10/19/2022 Medical Rec #:  [id number]       Height:       61.0 in Accession #:    [id number]      Weight:       194.2 lb Date of Birth:  [birth year]       BSA:          1.865 m Patient Age:    77 years        BP:           104/62 mmHg Patient Gender: F               HR:           84 bpm. Exam Location:  Inpatient Procedure: 2D Echo, Cardiac Doppler, Color Doppler and Intracardiac            Opacification Agent Indications:    Acute myocardial infarction                 Shock  History:        Patient has prior history of Echocardiogram examinations, most                 recent 09/28/2022. CHF, Pulmonary HTN,                 Signs/Symptoms:Hypotension; Risk Factors:Diabetes, Dyslipidemia,                 Hypertension and Morbid Obesity. Cor pulmonale, CKD, chronic                 hypoxic respiratory failure, hx of PE.  Sonographer:    Milda Smart Referring Phys: [id number] OMAR M ALBUSTAMI  Sonographer Comments: Image acquisition challenging due to patient body habitus and Image acquisition challenging due to respiratory motion. Pt somnolent during exam. IMPRESSIONS  1. Left ventricular ejection fraction, by estimation, is 60 to 65%. The left ventricle has normal function. The left ventricle has no regional wall motion abnormalities. There is mild left ventricular hypertrophy. Left ventricular diastolic parameters are indeterminate. There is the interventricular septum is flattened in systole, consistent with right ventricular pressure overload.  2. Right ventricular systolic function is  moderate-severely reduced. The right ventricular size is severely enlarged. There is severely elevated  Physician Discharge Summary   Patient: Kristine Todd MRN: [id number] DOB: 1945-01-15  Admit date:     10/18/2022  Discharge date: 10/24/22  Discharge Physician: Marinda Elk   PCP: Patient, No Pcp Per   Recommendations at discharge:   Patient is DNR and comfort measures Activity level: Patient is bedbound but may sit up in bed or be moved to a recliner with assistance. Patient received today regular diet for comfort feeds. Patient requires assistance with all meals. Wound Care bilateral ischial wounds: Every 3 days clean with wounds with saline or water, apply silicone foam dressings to the bilateral ischial wounds.  Change every 3 days.   Wound care, sacral wound: Daily, cleanse sacral wound with Vashe, pat dry.  Apply 1/4 inch thick layer of Medihoney to wound bed, toppled saline moist gauze, top with silicone foam. Patient has an indwelling foley catheter due to multiple areas of skin breakdown.  Please maintain foley catheter per facility protocol and change Q30 days.  Discharge Diagnoses: Principal Problem:   multifactorial distributive shock Active Problems:   AKI (acute kidney injury) (HCC)   Chronic respiratory failure with hypoxia (HCC)   Dementia without behavioral disturbance (HCC)   Type 2 diabetes mellitus without complication, without long-term current use of insulin (HCC)   COPD (chronic obstructive pulmonary disease) (HCC)   Goals of care, counseling/discussion  Resolved Problems:   * No resolved hospital problems. *   Hospital Course: 77 year old female with past medical history of dementia, hypertension, diabetes mellitus type 2, diastolic congestive heart failure, hyperlipidemia, gastroesophageal reflux disease, chronic kidney disease stage III, chronic hypoxic respiratory failure on 4 L of oxygen via nasal cannula at baseline brought to [facility] from [facility] with hypotension and shortness of breath.  Upon  evaluation in the emergency department the patient was found to be hypotensive and in acute kidney injury with an abnormal urinalysis concerns for sepsis secondary to urinary tract infection and septic shock.  Patient was initiated on intravenous ceftriaxone initially admitted under the PCCM service to the intensive care unit for initiation of vasopressors.  Patient clinically improved in the days that followed with downtrending creatinine, improving blood pressures and discontinuation of vasopressors.  With all cultures coming back negative intravenous ceftriaxone was discontinued after 4 days.    After goals of care discussions between PCCM and family, decision was made to transition patient to comfort measures.  Patient was then transferred to the hospitalist service.  In the days that followed after multiple follow-up goals of care planning meetings were had between palliative care, the hospitalist service and [facility] arranges were made for the patient to be discharged to [facility] with hospice services in stable condition.       Pain control - [employer] Controlled Substance Reporting System database was reviewed. and patient was instructed, not to drive, operate heavy machinery, perform activities at heights, swimming or participation in water activities or provide baby-sitting services while on Pain, Sleep and Anxiety Medications; until their outpatient Physician has advised to do so again. Also recommended to not to take more than prescribed Pain, Sleep and Anxiety Medications.   Consultants: Dr. Linna Darner with palliative Care Procedures performed: None  Disposition: Skilled nursing facility with hospice Diet recommendation:  Discharge Diet Orders (From admission, onward)     Start     Ordered   10/24/22 0000  Diet general       Comments: Patient needs assistance with all  Monte Fantasia Hart [place] # [phone]), pat dry. Apply Apply 1/4" thick layer of leptospermum honey to wound bed, top with saline moist gauze, top with silicone foam,change daily. Ok to lift silicone foam to reapply Medihoney daily.   10/24/22 1038             Discharge Exam: Filed Weights   10/19/22 0204 10/20/22 0500 10/21/22 0500  Weight: 88.1 kg 86.7 kg 86.5 kg    Constitutional: Awake alert and oriented x 1 Respiratory: clear to auscultation bilaterally, no wheezing, no crackles. Normal respiratory effort. No accessory muscle use.  Cardiovascular: Regular rate and rhythm, no murmurs / rubs / gallops. No extremity edema. 2+ pedal pulses. No carotid bruits.  Abdomen: Abdomen is soft and nontender.  No evidence of intra-abdominal masses.  Positive bowel sounds noted in all quadrants.   Musculoskeletal: No  joint deformity upper and lower extremities. Good ROM, no contractures. Normal muscle tone.     Condition at discharge: fair  The results of significant diagnostics from this hospitalization (including imaging, microbiology, ancillary and laboratory) are listed below for reference.   Imaging Studies: ECHOCARDIOGRAM COMPLETE  Result Date: 10/19/2022    ECHOCARDIOGRAM REPORT   Patient Name:   Kristine Todd Hernandes Date of Exam: 10/19/2022 Medical Rec #:  [id number]       Height:       61.0 in Accession #:    [id number]      Weight:       194.2 lb Date of Birth:  [birth year]       BSA:          1.865 m Patient Age:    77 years        BP:           104/62 mmHg Patient Gender: F               HR:           84 bpm. Exam Location:  Inpatient Procedure: 2D Echo, Cardiac Doppler, Color Doppler and Intracardiac            Opacification Agent Indications:    Acute myocardial infarction                 Shock  History:        Patient has prior history of Echocardiogram examinations, most                 recent 09/28/2022. CHF, Pulmonary HTN,                 Signs/Symptoms:Hypotension; Risk Factors:Diabetes, Dyslipidemia,                 Hypertension and Morbid Obesity. Cor pulmonale, CKD, chronic                 hypoxic respiratory failure, hx of PE.  Sonographer:    Milda Smart Referring Phys: [id number] OMAR M [place]  Sonographer Comments: Image acquisition challenging due to patient body habitus and Image acquisition challenging due to respiratory motion. Pt somnolent during exam. IMPRESSIONS  1. Left ventricular ejection fraction, by estimation, is 60 to 65%. The left ventricle has normal function. The left ventricle has no regional wall motion abnormalities. There is mild left ventricular hypertrophy. Left ventricular diastolic parameters are indeterminate. There is the interventricular septum is flattened in systole, consistent with right ventricular pressure overload.  2. Right ventricular systolic function is  moderate-severely reduced. The right ventricular size is severely enlarged. There is severely elevated

## 2022-10-24 NOTE — Progress Notes (Signed)
PMT no charge note.   Discussed with TRH MD, patient to be discharged to [facility], with the addition of [facility] to follow for Hospice.  No charge Rosalin Hawking MD

## 2022-10-30 NOTE — Plan of Care (Signed)
[facility] Tonsillectomy/Adenoidectomy, Postoperative PEDS care plan entered in error.

## 2022-11-08 DEATH — deceased
